# Patient Record
Sex: Female | Born: 1945 | Race: White | Hispanic: No | Smoking: Former smoker
Health system: Southern US, Community
[De-identification: ages and names within clinical notes are randomized; demographics above are authoritative.]

## PROBLEM LIST (undated history)

## (undated) DIAGNOSIS — T753XXA Motion sickness, initial encounter: Secondary | ICD-10-CM

## (undated) DIAGNOSIS — J029 Acute pharyngitis, unspecified: Secondary | ICD-10-CM

## (undated) DIAGNOSIS — I10 Essential (primary) hypertension: Secondary | ICD-10-CM

## (undated) DIAGNOSIS — M1990 Unspecified osteoarthritis, unspecified site: Secondary | ICD-10-CM

## (undated) DIAGNOSIS — M539 Dorsopathy, unspecified: Secondary | ICD-10-CM

## (undated) DIAGNOSIS — J45909 Unspecified asthma, uncomplicated: Secondary | ICD-10-CM

## (undated) DIAGNOSIS — M810 Age-related osteoporosis without current pathological fracture: Secondary | ICD-10-CM

## (undated) DIAGNOSIS — K219 Gastro-esophageal reflux disease without esophagitis: Secondary | ICD-10-CM

## (undated) DIAGNOSIS — Z22322 Carrier or suspected carrier of Methicillin resistant Staphylococcus aureus: Secondary | ICD-10-CM

## (undated) HISTORY — PX: TONSILLECTOMY: SUR1361

## (undated) HISTORY — DX: Carrier or suspected carrier of methicillin resistant Staphylococcus aureus: Z22.322

## (undated) HISTORY — DX: Acute pharyngitis, unspecified: J02.9

## (undated) HISTORY — PX: ABDOMINAL HYSTERECTOMY: SHX81

---

## 2004-10-25 ENCOUNTER — Ambulatory Visit: Payer: Self-pay | Admitting: General Surgery

## 2004-10-28 ENCOUNTER — Ambulatory Visit: Payer: Self-pay | Admitting: General Surgery

## 2004-11-17 ENCOUNTER — Ambulatory Visit: Payer: Self-pay | Admitting: General Surgery

## 2006-01-26 ENCOUNTER — Ambulatory Visit: Payer: Self-pay | Admitting: Family Medicine

## 2006-05-26 ENCOUNTER — Ambulatory Visit: Payer: Self-pay | Admitting: Family Medicine

## 2007-02-08 ENCOUNTER — Ambulatory Visit: Payer: Self-pay | Admitting: Family Medicine

## 2007-04-23 ENCOUNTER — Observation Stay: Payer: Self-pay | Admitting: Internal Medicine

## 2007-04-23 ENCOUNTER — Other Ambulatory Visit: Payer: Self-pay

## 2007-05-30 ENCOUNTER — Ambulatory Visit: Payer: Self-pay | Admitting: Family Medicine

## 2007-06-06 ENCOUNTER — Observation Stay: Payer: Self-pay | Admitting: Internal Medicine

## 2007-06-06 ENCOUNTER — Other Ambulatory Visit: Payer: Self-pay

## 2007-10-03 ENCOUNTER — Emergency Department: Payer: Self-pay | Admitting: Internal Medicine

## 2008-06-29 ENCOUNTER — Ambulatory Visit: Payer: Self-pay | Admitting: Family Medicine

## 2008-07-16 ENCOUNTER — Ambulatory Visit: Payer: Self-pay | Admitting: Family Medicine

## 2009-01-13 ENCOUNTER — Ambulatory Visit: Payer: Self-pay | Admitting: Family Medicine

## 2009-03-23 DIAGNOSIS — K219 Gastro-esophageal reflux disease without esophagitis: Secondary | ICD-10-CM | POA: Insufficient documentation

## 2009-08-10 ENCOUNTER — Encounter: Payer: Self-pay | Admitting: Rheumatology

## 2009-08-20 ENCOUNTER — Encounter: Payer: Self-pay | Admitting: Rheumatology

## 2009-09-01 ENCOUNTER — Ambulatory Visit: Payer: Self-pay | Admitting: Family Medicine

## 2010-02-03 ENCOUNTER — Ambulatory Visit: Payer: Self-pay | Admitting: Family Medicine

## 2010-03-15 DIAGNOSIS — E782 Mixed hyperlipidemia: Secondary | ICD-10-CM | POA: Insufficient documentation

## 2010-11-24 ENCOUNTER — Ambulatory Visit: Payer: Self-pay | Admitting: Rheumatology

## 2013-12-16 ENCOUNTER — Ambulatory Visit: Payer: Self-pay | Admitting: Family Medicine

## 2014-10-13 DIAGNOSIS — G894 Chronic pain syndrome: Secondary | ICD-10-CM | POA: Insufficient documentation

## 2014-11-14 ENCOUNTER — Encounter: Payer: Self-pay | Admitting: Emergency Medicine

## 2014-11-14 ENCOUNTER — Emergency Department
Admission: EM | Admit: 2014-11-14 | Discharge: 2014-11-14 | Disposition: A | Discharge status: Home / Self Care | Payer: Medicare HMO | Attending: Emergency Medicine | Admitting: Emergency Medicine

## 2014-11-14 DIAGNOSIS — M542 Cervicalgia: Secondary | ICD-10-CM | POA: Insufficient documentation

## 2014-11-14 DIAGNOSIS — M1990 Unspecified osteoarthritis, unspecified site: Secondary | ICD-10-CM | POA: Diagnosis not present

## 2014-11-14 DIAGNOSIS — Z880 Allergy status to penicillin: Secondary | ICD-10-CM | POA: Insufficient documentation

## 2014-11-14 DIAGNOSIS — Z79899 Other long term (current) drug therapy: Secondary | ICD-10-CM | POA: Insufficient documentation

## 2014-11-14 DIAGNOSIS — G8929 Other chronic pain: Secondary | ICD-10-CM | POA: Insufficient documentation

## 2014-11-14 DIAGNOSIS — M25552 Pain in left hip: Secondary | ICD-10-CM | POA: Insufficient documentation

## 2014-11-14 DIAGNOSIS — M549 Dorsalgia, unspecified: Secondary | ICD-10-CM | POA: Diagnosis present

## 2014-11-14 DIAGNOSIS — Z720 Tobacco use: Secondary | ICD-10-CM | POA: Insufficient documentation

## 2014-11-14 DIAGNOSIS — M25522 Pain in left elbow: Secondary | ICD-10-CM | POA: Diagnosis not present

## 2014-11-14 MED ORDER — OXYCODONE-ACETAMINOPHEN 10-325 MG PO TABS: 1.0000 | ORAL_TABLET | Freq: Every day | ORAL | Status: DC | PRN

## 2014-11-14 MED ORDER — OXYCODONE-ACETAMINOPHEN 5-325 MG PO TABS
ORAL_TABLET | ORAL | Status: DC
Start: 2014-11-14 — End: 2014-11-14
  Filled 2014-11-14: qty 1

## 2014-11-14 MED ORDER — OXYCODONE-ACETAMINOPHEN 5-325 MG PO TABS
1.0000 | ORAL_TABLET | Freq: Once | ORAL | Status: AC
  Administered 2014-11-14: 1 via ORAL

## 2014-11-14 NOTE — ED Notes (Signed)
Patient states that she has arthritis and that her pain has been worse today. Patient states that she takes percocet but has been out of her pain medication. Patient with pain to left elbow, lower back and left hip.

## 2014-11-14 NOTE — ED Notes (Signed)
Dr. Dolores Frame notified of pt's hypertension.

## 2014-11-14 NOTE — ED Notes (Signed)
Report to bill, rn.  

## 2014-11-14 NOTE — ED Provider Notes (Signed)
[facility] Emergency Department Provider Note  ____________________________________________  Time seen: 7:10 AM  I have reviewed the triage vital signs and the nursing notes.   HISTORY  Chief Complaint Back Pain; Hip Pain; and Arthritis    HPI Jennifer Rivers is a 69 y.o. female who complains of acute worsening of her chronic osteoarthritis joint pains due to running out of her Percocet. She normally gets it filled around the 25th of every month according to review of the drug database. She last had it filled in May 2016 and gets 30 day supply each time. She reports that due to a personal issue between her primary care doctor and her sister, she is having to find a new doctor and is therefore unable to get a refill of her medication. She does have refills remaining of her Valium.She denies any new or unusual symptoms. No chest pain shortness of breath abdominal pain no falls or trauma. No headaches or neck pain of note.     No past medical history on file. Asthma, osteoarthritis, chronic pain, hypertension There are no active problems to display for this patient.   No past surgical history on file.  Current Outpatient Rx  Name  Route  Sig  Dispense  Refill  . albuterol (PROVENTIL HFA;VENTOLIN HFA) 108 (90 BASE) MCG/ACT inhaler   Inhalation   Inhale 2 puffs into the lungs every 4 (four) hours as needed for wheezing or shortness of breath.         . cyclobenzaprine (FLEXERIL) 10 MG tablet   Oral   Take 10 mg by mouth 3 (three) times daily as needed for muscle spasms.         . diazepam (VALIUM) 5 MG tablet   Oral   Take 5 mg by mouth 2 (two) times daily as needed for muscle spasms.         . metoprolol tartrate (LOPRESSOR) 25 MG tablet   Oral   Take 25 mg by mouth 2 (two) times daily.         Marland Kitchen oxyCODONE-acetaminophen (PERCOCET) 10-325 MG per tablet   Oral   Take 1 tablet by mouth daily as needed for pain.         Marland Kitchen  oxyCODONE-acetaminophen (PERCOCET) 10-325 MG per tablet   Oral   Take 1 tablet by mouth daily as needed for pain.   10 tablet   0     Allergies Penicillins  No family history on file.  Social History History  Substance Use Topics  . Smoking status: Current Every Day Smoker -- 0.50 packs/day    Types: Cigarettes  . Smokeless tobacco: Not on file  . Alcohol Use: No    Review of Systems  Constitutional: No fever or chills. No weight changes Eyes:No blurry vision or double vision.  ENT: No sore throat. Cardiovascular: No chest pain. Respiratory: No dyspnea or cough. Gastrointestinal: Negative for abdominal pain, vomiting and diarrhea.  No BRBPR or melena. Genitourinary: Negative for dysuria, urinary retention, bloody urine, or difficulty urinating. Musculoskeletal: Chronic neck pain, chronic left elbow or back and left hip pain. Skin: Negative for rash. Neurological: Negative for headaches, focal weakness or numbness. Psychiatric:No anxiety or depression.   Endocrine:No hot/cold intolerance, changes in energy, or sleep difficulty.  10-point ROS otherwise negative.  ____________________________________________   PHYSICAL EXAM:  VITAL SIGNS: ED Triage Vitals  Enc Vitals Group     BP 11/14/14 0151 170/110 mmHg     Pulse Rate 11/14/14 0151 77  Resp 11/14/14 0151 18     Temp 11/14/14 0151 97.9 F (36.6 C)     Temp Source 11/14/14 0151 Oral     SpO2 11/14/14 0151 96 %     Weight 11/14/14 0151 130 lb (58.968 kg)     Height 11/14/14 0151 5\' 2"  (1.575 m)     Head Cir --      Peak Flow --      Pain Score 11/14/14 0152 7     Pain Loc --      Pain Edu? --      Excl. in GC? --      Constitutional: Alert and oriented. Well appearing and in no distress. Eyes: No scleral icterus. No conjunctival pallor. PERRL. EOMI ENT   Head: Normocephalic and atraumatic.   Nose: No congestion/rhinnorhea. No septal hematoma   Mouth/Throat: MMM, no pharyngeal erythema.  No peritonsillar mass. No uvula shift.   Neck: No stridor. No SubQ emphysema. No meningismus. C-spine nontender, full range of motion Hematological/Lymphatic/Immunilogical: No cervical lymphadenopathy. Cardiovascular: RRR. Normal and symmetric distal pulses are present in all extremities. No murmurs, rubs, or gallops. Respiratory: Normal respiratory effort without tachypnea nor retractions. Breath sounds are clear and equal bilaterally. No wheezes/rales/rhonchi. Gastrointestinal: Soft and nontender. No distention. There is no CVA tenderness.  No rebound, rigidity, or guarding. Genitourinary: deferred Musculoskeletal: Nontender with normal range of motion in all extremities. No joint effusions.  No lower extremity tenderness.  No edema. Ambulatory without difficulty Neurologic:   Normal speech and language.  CN 2-10 normal. Motor grossly intact. Normal gait. No gross focal neurologic deficits are appreciated.  Skin:  Skin is warm, dry and intact. No rash noted.  No petechiae, purpura, or bullae. Psychiatric: Mood and affect are normal. Speech and behavior are normal. Patient exhibits appropriate insight and judgment.  ____________________________________________    LABS (pertinent positives/negatives) (all labs ordered are listed, but only abnormal results are displayed) Labs Reviewed - No data to display ____________________________________________   EKG    ____________________________________________    RADIOLOGY    ____________________________________________   PROCEDURES  ____________________________________________   INITIAL IMPRESSION / ASSESSMENT AND PLAN / ED COURSE  Pertinent labs & imaging results that were available during my care of the patient were reviewed by me and considered in my medical decision making (see chart for details).  Patient presents with acute on chronic osteoarthritis pain due to running out of her Percocet. No other notable findings  on history or physical exam. I did inform the patient of our department policy against prescribing opioid pain medications for chronic pain and that I could only give her a short supply to help bridge the gap until she can find a new doctor. Patient was counseled on risks of opioid pain medication and avoiding driving or operating dangerous equipment while taking this medication. She is been on it chronically and so is very comfortable with how she tolerates the medicine. No evidence of fracture or dislocation sepsis and osteomyelitis soft tissue infection or other acute illness.  ____________________________________________   FINAL CLINICAL IMPRESSION(S) / ED DIAGNOSES  Final diagnoses:  Chronic osteoarthritis      Sharman Cheek, MD 11/14/14 [phone]

## 2014-11-14 NOTE — Discharge Instructions (Signed)
Arthritis, Nonspecific °Arthritis is inflammation of a joint. This usually means pain, redness, warmth or swelling are present. One or more joints may be involved. There are a number of types of arthritis. Your caregiver may not be able to tell what type of arthritis you have right away. °CAUSES  °The most common cause of arthritis is the wear and tear on the joint (osteoarthritis). This causes damage to the cartilage, which can break down over time. The knees, hips, back and neck are most often affected by this type of arthritis. °Other types of arthritis and common causes of joint pain include: °· Sprains and other injuries near the joint. Sometimes minor sprains and injuries cause pain and swelling that develop hours later. °· Rheumatoid arthritis. This affects hands, feet and knees. It usually affects both sides of your body at the same time. It is often associated with chronic ailments, fever, weight loss and general weakness. °· Crystal arthritis. Gout and pseudo gout can cause occasional acute severe pain, redness and swelling in the foot, ankle, or knee. °· Infectious arthritis. Bacteria can get into a joint through a break in overlying skin. This can cause infection of the joint. Bacteria and viruses can also spread through the blood and affect your joints. °· Drug, infectious and allergy reactions. Sometimes joints can become mildly painful and slightly swollen with these types of illnesses. °SYMPTOMS  °· Pain is the main symptom. °· Your joint or joints can also be red, swollen and warm or hot to the touch. °· You may have a fever with certain types of arthritis, or even feel overall ill. °· The joint with arthritis will hurt with movement. Stiffness is present with some types of arthritis. °DIAGNOSIS  °Your caregiver will suspect arthritis based on your description of your symptoms and on your exam. Testing may be needed to find the type of arthritis: °· Blood and sometimes urine tests. °· X-ray tests  and sometimes CT or MRI scans. °· Removal of fluid from the joint (arthrocentesis) is done to check for bacteria, crystals or other causes. Your caregiver (or a specialist) will numb the area over the joint with a local anesthetic, and use a needle to remove joint fluid for examination. This procedure is only minimally uncomfortable. °· Even with these tests, your caregiver may not be able to tell what kind of arthritis you have. Consultation with a specialist (rheumatologist) may be helpful. °TREATMENT  °Your caregiver will discuss with you treatment specific to your type of arthritis. If the specific type cannot be determined, then the following general recommendations may apply. °Treatment of severe joint pain includes: °· Rest. °· Elevation. °· Anti-inflammatory medication (for example, ibuprofen) may be prescribed. Avoiding activities that cause increased pain. °· Only take over-the-counter or prescription medicines for pain and discomfort as recommended by your caregiver. °· Cold packs over an inflamed joint may be used for 10 to 15 minutes every hour. Hot packs sometimes feel better, but do not use overnight. Do not use hot packs if you are diabetic without your caregiver's permission. °· A cortisone shot into arthritic joints may help reduce pain and swelling. °· Any acute arthritis that gets worse over the next 1 to 2 days needs to be looked at to be sure there is no joint infection. °Long-term arthritis treatment involves modifying activities and lifestyle to reduce joint stress jarring. This can include weight loss. Also, exercise is needed to nourish the joint cartilage and remove waste. This helps keep the muscles   around the joint strong. HOME CARE INSTRUCTIONS   Do not take aspirin to relieve pain if gout is suspected. This elevates uric acid levels.  Only take over-the-counter or prescription medicines for pain, discomfort or fever as directed by your caregiver.  Rest the joint as much as  possible.  If your joint is swollen, keep it elevated.  Use crutches if the painful joint is in your leg.  Drinking plenty of fluids may help for certain types of arthritis.  Follow your caregiver's dietary instructions.  Try low-impact exercise such as:  Swimming.  Water aerobics.  Biking.  Walking.  Morning stiffness is often relieved by a warm shower.  Put your joints through regular range-of-motion. SEEK MEDICAL CARE IF:   You do not feel better in 24 hours or are getting worse.  You have side effects to medications, or are not getting better with treatment. SEEK IMMEDIATE MEDICAL CARE IF:   You have a fever.  You develop severe joint pain, swelling or redness.  Many joints are involved and become painful and swollen.  There is severe back pain and/or leg weakness.  You have loss of bowel or bladder control. Document Released: 06/15/2004 Document Revised: 07/31/2011 Document Reviewed: 07/01/2008 [facility] Patient Information 2015 [place], [place]. This information is not intended to replace advice given to you by your health care provider. Make sure you discuss any questions you have with your health care provider.   You were prescribed a medication that is potentially sedating. Do not drink alcohol, drive or participate in any other potentially dangerous activities while taking this medication as it may make you sleepy. Do not take this medication with any other sedating medications, either prescription or over-the-counter. If you were prescribed Percocet or Vicodin, do not take these with acetaminophen (Tylenol) as it is already contained within these medications.   Opioid pain medications (or "narcotics") can be habit forming.  Use it as little as possible to achieve adequate pain control.  Do not use or use it with extreme caution if you have a history of opiate abuse or dependence.  If you are on a pain contract with your primary care doctor or a pain specialist,  be sure to let them know you were prescribed this medication today from the [facility] Emergency Department.  This medication is intended for your use only - do not give any to anyone else and keep it in a secure place where nobody else, especially children and pets, have access to it.  It will also cause or worsen constipation, so you may want to consider taking an over-the-counter stool softener while you are taking this medication.

## 2014-12-23 ENCOUNTER — Ambulatory Visit
Admission: EM | Admit: 2014-12-23 | Discharge: 2014-12-23 | Disposition: A | Discharge status: Home / Self Care | Payer: Medicare HMO | Attending: Family Medicine | Admitting: Family Medicine

## 2014-12-23 DIAGNOSIS — M25511 Pain in right shoulder: Secondary | ICD-10-CM | POA: Diagnosis not present

## 2014-12-23 HISTORY — DX: Age-related osteoporosis without current pathological fracture: M81.0

## 2014-12-23 HISTORY — DX: Essential (primary) hypertension: I10

## 2014-12-23 HISTORY — DX: Unspecified asthma, uncomplicated: J45.909

## 2014-12-23 HISTORY — DX: Unspecified osteoarthritis, unspecified site: M19.90

## 2014-12-23 HISTORY — DX: Dorsopathy, unspecified: M53.9

## 2014-12-23 MED ORDER — CYCLOBENZAPRINE HCL 10 MG PO TABS: 10.0000 mg | ORAL_TABLET | Freq: Three times a day (TID) | ORAL | Status: DC | PRN

## 2014-12-23 MED ORDER — CYCLOBENZAPRINE HCL 10 MG PO TABS: 10.0000 mg | ORAL_TABLET | Freq: Two times a day (BID) | ORAL | Status: DC | PRN

## 2014-12-23 MED ORDER — IBUPROFEN 600 MG PO TABS: 600.0000 mg | ORAL_TABLET | Freq: Four times a day (QID) | ORAL | Status: DC | PRN

## 2014-12-23 NOTE — ED Notes (Signed)
States started with sudden onset of right shoulder pain and right lateral neck discomfort. States has occasional tingling down right arm

## 2014-12-23 NOTE — Discharge Instructions (Signed)
Recommend supportive treatment with rest, warm moist compresses four times daily for Follow up as needed if symptoms worsen or don't improve Arthritis, Nonspecific Arthritis is inflammation of a joint. This usually means pain, redness, warmth or swelling are present. One or more joints may be involved. There are a number of types of arthritis. Your caregiver may not be able to tell what type of arthritis you have right away. CAUSES  The most common cause of arthritis is the wear and tear on the joint (osteoarthritis). This causes damage to the cartilage, which can break down over time. The knees, hips, back and neck are most often affected by this type of arthritis. Other types of arthritis and common causes of joint pain include:  Sprains and other injuries near the joint. Sometimes minor sprains and injuries cause pain and swelling that develop hours later.  Rheumatoid arthritis. This affects hands, feet and knees. It usually affects both sides of your body at the same time. It is often associated with chronic ailments, fever, weight loss and general weakness.  Crystal arthritis. Gout and pseudo gout can cause occasional acute severe pain, redness and swelling in the foot, ankle, or knee.  Infectious arthritis. Bacteria can get into a joint through a break in overlying skin. This can cause infection of the joint. Bacteria and viruses can also spread through the blood and affect your joints.  Drug, infectious and allergy reactions. Sometimes joints can become mildly painful and slightly swollen with these types of illnesses. SYMPTOMS   Pain is the main symptom.  Your joint or joints can also be red, swollen and warm or hot to the touch.  You may have a fever with certain types of arthritis, or even feel overall ill.  The joint with arthritis will hurt with movement. Stiffness is present with some types of arthritis. DIAGNOSIS  Your caregiver will suspect arthritis based on your  description of your symptoms and on your exam. Testing may be needed to find the type of arthritis:  Blood and sometimes urine tests.  X-ray tests and sometimes CT or MRI scans.  Removal of fluid from the joint (arthrocentesis) is done to check for bacteria, crystals or other causes. Your caregiver (or a specialist) will numb the area over the joint with a local anesthetic, and use a needle to remove joint fluid for examination. This procedure is only minimally uncomfortable.  Even with these tests, your caregiver may not be able to tell what kind of arthritis you have. Consultation with a specialist (rheumatologist) may be helpful. TREATMENT  Your caregiver will discuss with you treatment specific to your type of arthritis. If the specific type cannot be determined, then the following general recommendations may apply. Treatment of severe joint pain includes:  Rest.  Elevation.  Anti-inflammatory medication (for example, ibuprofen) may be prescribed. Avoiding activities that cause increased pain.  Only take over-the-counter or prescription medicines for pain and discomfort as recommended by your caregiver.  Cold packs over an inflamed joint may be used for 10 to 15 minutes every hour. Hot packs sometimes feel better, but do not use overnight. Do not use hot packs if you are diabetic without your caregiver's permission.  A cortisone shot into arthritic joints may help reduce pain and swelling.  Any acute arthritis that gets worse over the next 1 to 2 days needs to be looked at to be sure there is no joint infection. Long-term arthritis treatment involves modifying activities and lifestyle to reduce joint stress  jarring. This can include weight loss. Also, exercise is needed to nourish the joint cartilage and remove waste. This helps keep the muscles around the joint strong. HOME CARE INSTRUCTIONS   Do not take aspirin to relieve pain if gout is suspected. This elevates uric acid  levels.  Only take over-the-counter or prescription medicines for pain, discomfort or fever as directed by your caregiver.  Rest the joint as much as possible.  If your joint is swollen, keep it elevated.  Use crutches if the painful joint is in your leg.  Drinking plenty of fluids may help for certain types of arthritis.  Follow your caregiver's dietary instructions.  Try low-impact exercise such as:  Swimming.  Water aerobics.  Biking.  Walking.  Morning stiffness is often relieved by a warm shower.  Put your joints through regular range-of-motion. SEEK MEDICAL CARE IF:   You do not feel better in 24 hours or are getting worse.  You have side effects to medications, or are not getting better with treatment. SEEK IMMEDIATE MEDICAL CARE IF:   You have a fever.  You develop severe joint pain, swelling or redness.  Many joints are involved and become painful and swollen.  There is severe back pain and/or leg weakness.  You have loss of bowel or bladder control. Document Released: 06/15/2004 Document Revised: 07/31/2011 Document Reviewed: 07/01/2008 [facility] Patient Information 2015 [place], [place]. This information is not intended to replace advice given to you by your health care provider. Make sure you discuss any questions you have with your health care provider.

## 2014-12-23 NOTE — ED Provider Notes (Signed)
CSN: [id number]     Arrival date & time 12/23/14  1203 History   First MD Initiated Contact with Patient 12/23/14 1319     Chief Complaint  Patient presents with  . Shoulder Pain   (Consider location/radiation/quality/duration/timing/severity/associated sxs/prior Treatment) Patient is a 69 y.o. female presenting with shoulder pain.  Shoulder Pain   Pt reports that she was in her usual state of health until a week ago when she was cleaning the house.  Pain came on all of a sudden in right shoulder.  No trauma.  She has some popping in the shoulder joint when she moves but has had popping for years.  Pain 8/10 now.  Described as "a bad hurt".  Pain not worse with movement.  Nothing makes pain better.  She has tried goodys powders, bengay,& 2-3 ibuprofen every 5-6 hrs without relief.  She does have hx of osteoporosis & osteoarthritis.  She has chronic pain.  She has seen Dr Jennifer Nicks. Gavin Rivers but not in past 3 years.  Pain radiates down her arm & up right next.  Denies chest pain, diaphoresis or dizziness..  She reports bilateral limitation of shoulder ROM for months.  Never had orthopedic surgeries.  Past Medical History  Diagnosis Date  . Hypertension   . Asthma   . Multilevel degenerative disc disease   . Osteoarthritis   . Osteoporosis    Past Surgical History  Procedure Laterality Date  . Abdominal hysterectomy    . Tonsillectomy     Family History  Problem Relation Age of Onset  . Diabetes Mother   . Heart attack Father    History  Substance Use Topics  . Smoking status: Current Every Day Smoker -- 0.50 packs/day    Types: Cigarettes  . Smokeless tobacco: Not on file  . Alcohol Use: No   OB History    No data available     Review of Systems  All other systems reviewed and are negative.   Allergies  Penicillins  Home Medications   Prior to Admission medications   Medication Sig Start Date End Date Taking? Authorizing Provider  albuterol (PROVENTIL HFA;VENTOLIN HFA) 108  (90 BASE) MCG/ACT inhaler Inhale 2 puffs into the lungs every 4 (four) hours as needed for wheezing or shortness of breath.   Yes Historical Provider, MD  cyclobenzaprine (FLEXERIL) 10 MG tablet Take 10 mg by mouth 3 (three) times daily as needed for muscle spasms.   Yes Historical Provider, MD  metoprolol tartrate (LOPRESSOR) 25 MG tablet Take 25 mg by mouth 2 (two) times daily.   Yes Historical Provider, MD  diazepam (VALIUM) 5 MG tablet Take 5 mg by mouth 2 (two) times daily as needed for muscle spasms.    Historical Provider, MD  oxyCODONE-acetaminophen (PERCOCET) 10-325 MG per tablet Take 1 tablet by mouth daily as needed for pain.    Historical Provider, MD  oxyCODONE-acetaminophen (PERCOCET) 10-325 MG per tablet Take 1 tablet by mouth daily as needed for pain. 11/14/14 11/14/15  Sharman Cheek, MD   BP 158/105 mmHg  Pulse 86  Temp(Src) 98.2 F (36.8 C) (Tympanic)  Resp 16  Ht 5\' 2"  (1.575 m)  Wt 132 lb (59.875 kg)  BMI 24.14 kg/m2  SpO2 100% Physical Exam  Constitutional: She is oriented to person, place, and time. She appears well-developed and well-nourished. No distress.  HENT:  Head: Normocephalic and atraumatic.  Eyes: Conjunctivae are normal. No scleral icterus.  Neck: Normal range of motion.  Cardiovascular: Normal rate and regular  rhythm.   Pulmonary/Chest: Effort normal and breath sounds normal. No respiratory distress.  Musculoskeletal: She exhibits no edema.       Right shoulder: She exhibits tenderness and pain. She exhibits no swelling, no effusion, no crepitus, no deformity, no laceration, no spasm, normal pulse and normal strength.       Arms: Neurological: She is alert and oriented to person, place, and time. No cranial nerve deficit.  Skin: Skin is warm and dry. No rash noted. No erythema.  Psychiatric: Her behavior is normal. Judgment normal.  Nursing note and vitals reviewed.   ED Course  Procedures (including critical care time)   MDM   1. Right  shoulder pain    Likely secondary to chronic arthritis exacerbation.  If pain continues or worsens, FU with PCP or Dr Gavin Rivers, her Orthopedist.  Plan: 1. Rx as per orders; risks, benefits, potential side effects reviewed with patient 2. Recommend supportive treatment with rest, warm moist compresses four times daily for 3. F/u prn if symptoms worsen or don't improve with PCP or Ortho as above     Joselyn Arrow, NP 12/23/14 1413

## 2015-03-10 ENCOUNTER — Observation Stay
Admission: EM | Admit: 2015-03-10 | Discharge: 2015-03-11 | Disposition: A | Discharge status: Home / Self Care | Payer: Medicare HMO | Attending: Internal Medicine | Admitting: Internal Medicine

## 2015-03-10 ENCOUNTER — Encounter: Payer: Self-pay | Admitting: *Deleted

## 2015-03-10 ENCOUNTER — Emergency Department: Payer: Medicare HMO

## 2015-03-10 DIAGNOSIS — M1990 Unspecified osteoarthritis, unspecified site: Secondary | ICD-10-CM | POA: Diagnosis not present

## 2015-03-10 DIAGNOSIS — Z23 Encounter for immunization: Secondary | ICD-10-CM | POA: Diagnosis not present

## 2015-03-10 DIAGNOSIS — J45909 Unspecified asthma, uncomplicated: Secondary | ICD-10-CM | POA: Diagnosis not present

## 2015-03-10 DIAGNOSIS — M810 Age-related osteoporosis without current pathological fracture: Secondary | ICD-10-CM | POA: Diagnosis not present

## 2015-03-10 DIAGNOSIS — F329 Major depressive disorder, single episode, unspecified: Secondary | ICD-10-CM | POA: Insufficient documentation

## 2015-03-10 DIAGNOSIS — K219 Gastro-esophageal reflux disease without esophagitis: Secondary | ICD-10-CM | POA: Diagnosis not present

## 2015-03-10 DIAGNOSIS — F17210 Nicotine dependence, cigarettes, uncomplicated: Secondary | ICD-10-CM | POA: Insufficient documentation

## 2015-03-10 DIAGNOSIS — Z7951 Long term (current) use of inhaled steroids: Secondary | ICD-10-CM | POA: Diagnosis not present

## 2015-03-10 DIAGNOSIS — R079 Chest pain, unspecified: Principal | ICD-10-CM | POA: Insufficient documentation

## 2015-03-10 DIAGNOSIS — Z833 Family history of diabetes mellitus: Secondary | ICD-10-CM | POA: Insufficient documentation

## 2015-03-10 DIAGNOSIS — I491 Atrial premature depolarization: Secondary | ICD-10-CM | POA: Diagnosis not present

## 2015-03-10 DIAGNOSIS — Z880 Allergy status to penicillin: Secondary | ICD-10-CM | POA: Diagnosis not present

## 2015-03-10 DIAGNOSIS — Z79899 Other long term (current) drug therapy: Secondary | ICD-10-CM | POA: Insufficient documentation

## 2015-03-10 DIAGNOSIS — Z90710 Acquired absence of both cervix and uterus: Secondary | ICD-10-CM | POA: Insufficient documentation

## 2015-03-10 DIAGNOSIS — Z8249 Family history of ischemic heart disease and other diseases of the circulatory system: Secondary | ICD-10-CM | POA: Insufficient documentation

## 2015-03-10 DIAGNOSIS — K449 Diaphragmatic hernia without obstruction or gangrene: Secondary | ICD-10-CM | POA: Diagnosis not present

## 2015-03-10 DIAGNOSIS — I10 Essential (primary) hypertension: Secondary | ICD-10-CM | POA: Diagnosis not present

## 2015-03-10 DIAGNOSIS — J9811 Atelectasis: Secondary | ICD-10-CM | POA: Insufficient documentation

## 2015-03-10 LAB — BASIC METABOLIC PANEL
Anion gap: 8 (ref 5–15)
BUN: 30 mg/dL — AB (ref 6–20)
CALCIUM: 9.3 mg/dL (ref 8.9–10.3)
CO2: 25 mmol/L (ref 22–32)
CREATININE: 1.35 mg/dL — AB (ref 0.44–1.00)
Chloride: 102 mmol/L (ref 101–111)
GFR calc non Af Amer: 40 mL/min — ABNORMAL LOW (ref >60)
GFR, EST AFRICAN AMERICAN: 46 mL/min — AB (ref >60)
Glucose, Bld: 105 mg/dL — ABNORMAL HIGH (ref 65–99)
Potassium: 4.4 mmol/L (ref 3.5–5.1)
SODIUM: 135 mmol/L (ref 135–145)

## 2015-03-10 LAB — CBC
HCT: 40.1 % (ref 35.0–47.0)
Hemoglobin: 13.3 g/dL (ref 12.0–16.0)
MCH: 28.7 pg (ref 26.0–34.0)
MCHC: 33.2 g/dL (ref 32.0–36.0)
MCV: 86.4 fL (ref 80.0–100.0)
PLATELETS: 258 10*3/uL (ref 150–440)
RBC: 4.64 MIL/uL (ref 3.80–5.20)
RDW: 15.9 % — AB (ref 11.5–14.5)
WBC: 14.7 10*3/uL — AB (ref 3.6–11.0)

## 2015-03-10 NOTE — ED Notes (Signed)
Pt reports intermittent chest pain and pain between shoulders since last night. No other associated symptoms.

## 2015-03-11 ENCOUNTER — Observation Stay: Payer: Medicare HMO

## 2015-03-11 DIAGNOSIS — R079 Chest pain, unspecified: Secondary | ICD-10-CM | POA: Diagnosis present

## 2015-03-11 LAB — TROPONIN I
Troponin I: <0.03 ng/mL (ref <0.031)
Troponin I: <0.03 ng/mL (ref <0.031)
Troponin I: <0.03 ng/mL (ref <0.031)
Troponin I: <0.03 ng/mL (ref <0.031)

## 2015-03-11 LAB — HEPATIC FUNCTION PANEL
ALBUMIN: 4.2 g/dL (ref 3.5–5.0)
ALT: 20 U/L (ref 14–54)
AST: 22 U/L (ref 15–41)
Alkaline Phosphatase: 78 U/L (ref 38–126)
Total Protein: 7.8 g/dL (ref 6.5–8.1)

## 2015-03-11 LAB — NM MYOCAR MULTI W/SPECT W/WALL MOTION / EF
CHL CUP NUCLEAR SDS: 1
CHL CUP NUCLEAR SRS: 2
LV sys vol: 52 mL
LVDIAVOL: 90 mL
NUC STRESS TID: 1.11
Peak HR: 114 {beats}/min
Rest HR: 71 {beats}/min
SSS: 2

## 2015-03-11 LAB — LIPASE, BLOOD: LIPASE: 33 U/L (ref 22–51)

## 2015-03-11 LAB — HEMOGLOBIN A1C: Hgb A1c MFr Bld: 5.6 % (ref 4.0–6.0)

## 2015-03-11 MED ORDER — TECHNETIUM TC 99M SESTAMIBI - CARDIOLITE
10.0000 | Freq: Once | INTRAVENOUS | Status: AC | PRN
  Administered 2015-03-11: 13:00:00 14.11 via INTRAVENOUS

## 2015-03-11 MED ORDER — MORPHINE SULFATE (PF) 4 MG/ML IV SOLN
4.0000 mg | Freq: Once | INTRAVENOUS | Status: AC
  Administered 2015-03-11: 4 mg via INTRAVENOUS
  Filled 2015-03-11: qty 1

## 2015-03-11 MED ORDER — DOCUSATE SODIUM 100 MG PO CAPS
100.0000 mg | ORAL_CAPSULE | Freq: Two times a day (BID) | ORAL | Status: DC
  Administered 2015-03-11: 100 mg via ORAL
  Filled 2015-03-11: qty 1

## 2015-03-11 MED ORDER — INFLUENZA VAC SPLIT QUAD 0.5 ML IM SUSY: 0.5000 mL | PREFILLED_SYRINGE | INTRAMUSCULAR | Status: DC

## 2015-03-11 MED ORDER — ACETAMINOPHEN 650 MG RE SUPP: 650.0000 mg | Freq: Four times a day (QID) | RECTAL | Status: DC | PRN

## 2015-03-11 MED ORDER — SODIUM CHLORIDE 0.9 % IJ SOLN
3.0000 mL | Freq: Two times a day (BID) | INTRAMUSCULAR | Status: DC
  Administered 2015-03-11: 3 mL via INTRAVENOUS

## 2015-03-11 MED ORDER — REGADENOSON 0.4 MG/5ML IV SOLN
0.4000 mg | Freq: Once | INTRAVENOUS | Status: AC
  Administered 2015-03-11: 0.4 mg via INTRAVENOUS

## 2015-03-11 MED ORDER — ONDANSETRON HCL 4 MG/2ML IJ SOLN
4.0000 mg | Freq: Once | INTRAMUSCULAR | Status: AC
  Administered 2015-03-11: 4 mg via INTRAVENOUS
  Filled 2015-03-11: qty 2

## 2015-03-11 MED ORDER — CYCLOBENZAPRINE HCL 10 MG PO TABS: 10.0000 mg | ORAL_TABLET | Freq: Two times a day (BID) | ORAL | Status: DC | PRN

## 2015-03-11 MED ORDER — MORPHINE SULFATE (PF) 2 MG/ML IV SOLN
2.0000 mg | INTRAVENOUS | Status: DC | PRN
Start: 2015-03-11 — End: 2015-03-11

## 2015-03-11 MED ORDER — HEPARIN SODIUM (PORCINE) 5000 UNIT/ML IJ SOLN
5000.0000 [IU] | Freq: Three times a day (TID) | INTRAMUSCULAR | Status: DC
  Administered 2015-03-11: 5000 [IU] via SUBCUTANEOUS
  Filled 2015-03-11: qty 1

## 2015-03-11 MED ORDER — ALBUTEROL SULFATE (2.5 MG/3ML) 0.083% IN NEBU: 3.0000 mL | INHALATION_SOLUTION | RESPIRATORY_TRACT | Status: DC | PRN

## 2015-03-11 MED ORDER — NITROGLYCERIN 0.4 MG SL SUBL
0.4000 mg | SUBLINGUAL_TABLET | Freq: Once | SUBLINGUAL | Status: AC
  Administered 2015-03-11 (×2): 0.4 mg via SUBLINGUAL
  Filled 2015-03-11: qty 1

## 2015-03-11 MED ORDER — ONDANSETRON HCL 4 MG/2ML IJ SOLN: 4.0000 mg | Freq: Four times a day (QID) | INTRAMUSCULAR | Status: DC | PRN

## 2015-03-11 MED ORDER — OXYCODONE-ACETAMINOPHEN 10-325 MG PO TABS: 1.0000 | ORAL_TABLET | Freq: Every day | ORAL | Status: DC

## 2015-03-11 MED ORDER — ASPIRIN 81 MG PO CHEW
324.0000 mg | CHEWABLE_TABLET | Freq: Once | ORAL | Status: AC
  Administered 2015-03-11: 324 mg via ORAL
  Filled 2015-03-11: qty 4

## 2015-03-11 MED ORDER — SODIUM CHLORIDE 0.9 % IV SOLN
INTRAVENOUS | Status: DC
  Administered 2015-03-11: 05:00:00 via INTRAVENOUS

## 2015-03-11 MED ORDER — METOPROLOL SUCCINATE ER 50 MG PO TB24
50.0000 mg | ORAL_TABLET | Freq: Every day | ORAL | Status: DC
  Administered 2015-03-11: 50 mg via ORAL
  Filled 2015-03-11: qty 1

## 2015-03-11 MED ORDER — PANTOPRAZOLE SODIUM 40 MG PO TBEC
40.0000 mg | DELAYED_RELEASE_TABLET | Freq: Every day | ORAL | Status: DC
  Administered 2015-03-11: 40 mg via ORAL
  Filled 2015-03-11: qty 1

## 2015-03-11 MED ORDER — OXYCODONE-ACETAMINOPHEN 5-325 MG PO TABS
1.0000 | ORAL_TABLET | Freq: Every day | ORAL | Status: DC
  Administered 2015-03-11: 1 via ORAL
  Filled 2015-03-11: qty 1

## 2015-03-11 MED ORDER — INFLUENZA VAC SPLIT QUAD 0.5 ML IM SUSY
0.5000 mL | PREFILLED_SYRINGE | Freq: Once | INTRAMUSCULAR | Status: AC
  Administered 2015-03-11: 0.5 mL via INTRAMUSCULAR
  Filled 2015-03-11: qty 0.5

## 2015-03-11 MED ORDER — HYDROCHLOROTHIAZIDE 12.5 MG PO CAPS
12.5000 mg | ORAL_CAPSULE | Freq: Every day | ORAL | Status: DC
  Administered 2015-03-11: 12.5 mg via ORAL
  Filled 2015-03-11: qty 1

## 2015-03-11 MED ORDER — ACETAMINOPHEN 325 MG PO TABS
650.0000 mg | ORAL_TABLET | Freq: Four times a day (QID) | ORAL | Status: DC | PRN
  Administered 2015-03-11: 650 mg via ORAL
  Filled 2015-03-11: qty 2

## 2015-03-11 MED ORDER — OXYCODONE HCL 5 MG PO TABS
5.0000 mg | ORAL_TABLET | Freq: Every day | ORAL | Status: DC
  Administered 2015-03-11: 5 mg via ORAL
  Filled 2015-03-11: qty 1

## 2015-03-11 MED ORDER — TECHNETIUM TC 99M SESTAMIBI - CARDIOLITE
32.0500 | Freq: Once | INTRAVENOUS | Status: AC | PRN
  Administered 2015-03-11: 14:00:00 32.05 via INTRAVENOUS

## 2015-03-11 MED ORDER — ONDANSETRON HCL 4 MG PO TABS: 4.0000 mg | ORAL_TABLET | Freq: Four times a day (QID) | ORAL | Status: DC | PRN

## 2015-03-11 NOTE — H&P (Signed)
Jennifer Rivers is an 69 y.o. female.   Chief Complaint: Chest pain HPI: The patient presents emergency department via EMS after squeezing chest pain that was substernal and radiating to her back, specifically between her shoulder blades. The patient states that the pain began at rest and lasted until she received aspirin in the emergency department. She denies associated shortness of breath, nausea, vomiting or diaphoresis. She states she's been having similar pain but in lesser severity over the last 2 days. Due to cardiac risk factors emergency department called the hospitalist service for admission.  Past Medical History  Diagnosis Date  . Hypertension   . Asthma   . Multilevel degenerative disc disease   . Osteoarthritis   . Osteoporosis     Past Surgical History  Procedure Laterality Date  . Abdominal hysterectomy    . Tonsillectomy      Family History  Problem Relation Age of Onset  . Diabetes Mother   . Heart attack Father    Social History:  reports that she has been smoking Cigarettes.  She has been smoking about 0.50 packs per day. She does not have any smokeless tobacco history on file. She reports that she does not drink alcohol or use illicit drugs.  Allergies:  Allergies  Allergen Reactions  . Penicillins Rash    Medications Prior to Admission  Medication Sig Dispense Refill  . albuterol (PROVENTIL HFA;VENTOLIN HFA) 108 (90 BASE) MCG/ACT inhaler Inhale 2 puffs into the lungs every 4 (four) hours as needed for wheezing or shortness of breath.    Marland Kitchen albuterol (VENTOLIN HFA) 108 (90 BASE) MCG/ACT inhaler Take 2 puffs by mouth every 6 (six) hours as needed.    . cyclobenzaprine (FLEXERIL) 10 MG tablet Take 1 tablet (10 mg total) by mouth 2 (two) times daily as needed for muscle spasms. 20 tablet 0  . hydrochlorothiazide (MICROZIDE) 12.5 MG capsule Take 1 capsule by mouth daily.    Marland Kitchen HYDROcodone-acetaminophen (NORCO/VICODIN) 5-325 MG tablet Take 1 tablet by mouth every 6  (six) hours as needed.    Marland Kitchen ibuprofen (ADVIL,MOTRIN) 600 MG tablet Take 1 tablet (600 mg total) by mouth every 6 (six) hours as needed. 30 tablet 0  . meloxicam (MOBIC) 15 MG tablet Take 1 tablet by mouth daily.    . metoprolol succinate (TOPROL-XL) 50 MG 24 hr tablet Take 1 tablet by mouth daily.    Marland Kitchen omeprazole (PRILOSEC) 20 MG capsule Take 1 capsule by mouth daily.    Marland Kitchen oxyCODONE-acetaminophen (PERCOCET) 10-325 MG tablet Take 1 tablet by mouth daily.    Marland Kitchen zolpidem (AMBIEN CR) 12.5 MG CR tablet Take 1 tablet by mouth at bedtime as needed.      Results for orders placed or performed during the hospital encounter of 03/10/15 (from the past 48 hour(s))  Basic metabolic panel     Status: Abnormal   Collection Time: 03/10/15 10:01 PM  Result Value Ref Range   Sodium 135 135 - 145 mmol/L   Potassium 4.4 3.5 - 5.1 mmol/L   Chloride 102 101 - 111 mmol/L   CO2 25 22 - 32 mmol/L   Glucose, Bld 105 (H) 65 - 99 mg/dL   BUN 30 (H) 6 - 20 mg/dL   Creatinine, Ser 1.35 (H) 0.44 - 1.00 mg/dL   Calcium 9.3 8.9 - 10.3 mg/dL   GFR calc non Af Amer 40 (L) >60 mL/min   GFR calc Af Amer 46 (L) >60 mL/min    Comment: (NOTE) The eGFR has  been calculated using the CKD EPI equation. This calculation has not been validated in all clinical situations. eGFR's persistently <60 mL/min signify possible Chronic Kidney Disease.    Anion gap 8 5 - 15  CBC     Status: Abnormal   Collection Time: 03/10/15 10:01 PM  Result Value Ref Range   WBC 14.7 (H) 3.6 - 11.0 K/uL   RBC 4.64 3.80 - 5.20 MIL/uL   Hemoglobin 13.3 12.0 - 16.0 g/dL   HCT 40.1 35.0 - 47.0 %   MCV 86.4 80.0 - 100.0 fL   MCH 28.7 26.0 - 34.0 pg   MCHC 33.2 32.0 - 36.0 g/dL   RDW 15.9 (H) 11.5 - 14.5 %   Platelets 258 150 - 440 K/uL  Troponin I     Status: None   Collection Time: 03/10/15 10:01 PM  Result Value Ref Range   Troponin I <0.03 <0.031 ng/mL    Comment:        NO INDICATION OF MYOCARDIAL INJURY.   Lipase, blood     Status: None    Collection Time: 03/10/15 10:01 PM  Result Value Ref Range   Lipase 33 22 - 51 U/L  Hepatic function panel     Status: Abnormal   Collection Time: 03/10/15 10:01 PM  Result Value Ref Range   Total Protein 7.8 6.5 - 8.1 g/dL   Albumin 4.2 3.5 - 5.0 g/dL   AST 22 15 - 41 U/L   ALT 20 14 - 54 U/L   Alkaline Phosphatase 78 38 - 126 U/L   Total Bilirubin <0.1 (L) 0.3 - 1.2 mg/dL   Bilirubin, Direct <0.1 (L) 0.1 - 0.5 mg/dL   Indirect Bilirubin NOT CALCULATED 0.3 - 0.9 mg/dL   Dg Chest 2 View  03/10/2015  CLINICAL DATA:  Chest pain since yesterday, radiates to the back between both shoulders. EXAM: CHEST  2 VIEW COMPARISON:  01/13/2009 FINDINGS: The cardiomediastinal contours are normal. Linear atelectasis or scarring in both mid and lower lung zones. Pulmonary vasculature is normal. No consolidation, pleural effusion, or pneumothorax. No acute osseous abnormalities are seen. IMPRESSION: Unchanged appearance of the chest with bibasilar atelectasis or scarring. No acute process. Electronically Signed   By: Jeb Levering M.D.   On: 03/10/2015 22:19    Review of Systems  Constitutional: Negative for fever, chills and diaphoresis.  HENT: Negative for sore throat and tinnitus.   Eyes: Negative for blurred vision and redness.  Respiratory: Negative for cough and shortness of breath.   Cardiovascular: Positive for chest pain. Negative for palpitations, orthopnea and PND.  Gastrointestinal: Negative for nausea, vomiting, abdominal pain and diarrhea.  Genitourinary: Negative for dysuria, urgency and frequency.  Musculoskeletal: Negative for myalgias and joint pain.  Skin: Negative for rash.       No lesions  Neurological: Negative for speech change, focal weakness and weakness.  Endo/Heme/Allergies: Does not bruise/bleed easily.       No temperature intolerance  Psychiatric/Behavioral: Positive for depression. Negative for suicidal ideas.    Blood pressure 140/68, pulse 50, temperature 98  F (36.7 C), temperature source Oral, resp. rate 20, height 5' 2"  (1.575 m), weight 72.576 kg (160 lb), SpO2 98 %. Physical Exam  Vitals reviewed. Constitutional: She is oriented to person, place, and time. She appears well-developed and well-nourished. No distress.  HENT:  Head: Normocephalic and atraumatic.  Mouth/Throat: Oropharynx is clear and moist.  Eyes: EOM are normal. Pupils are equal, round, and reactive to light.  Neck: Normal  range of motion. Neck supple. No JVD present. No tracheal deviation present. No thyromegaly present.  Cardiovascular: Normal rate, regular rhythm and normal heart sounds.  Exam reveals no gallop and no friction rub.   No murmur heard. Respiratory: Effort normal and breath sounds normal.  GI: Soft. Bowel sounds are normal. She exhibits no distension. There is no tenderness.  Genitourinary:  Deferred  Musculoskeletal: Normal range of motion. She exhibits no edema.  Lymphadenopathy:    She has no cervical adenopathy.  Neurological: She is alert and oriented to person, place, and time. No cranial nerve deficit. She exhibits normal muscle tone.  Skin: Skin is warm and dry. No rash noted. No erythema.  Psychiatric: She has a normal mood and affect. Her behavior is normal. Judgment and thought content normal.     Assessment/Plan This is a 69 year old Caucasian female admitted for chest pain. 1. Chest pain: Atypical; the pain has been intermittent over the last 3 days. She admits that she has a hiatal hernia and reflux that requires her to sleep on 3 pillows although she states that if she does not use multiple pillows she occasionally becomes short of breath. I doubt this is orthopnea and the patient does not exhibit signs of congestive heart failure but we will rule out myocardial ischemia that may be leading to intermittent decreased cardiac output. Discontinue NSAIDs. Follow cardiac biomarkers. Check lipids. Cardiology consult placed. 2. Hypertension: Continue  hydrochlorothiazide 3. Depression: The patient admits to being under a lot of emotional stress; possibly amplifying noncardiac chest pain. Consider initiation of SSRI 4. DVT prophylaxis: Heparin 5. GI prophylaxis: Pantoprazole due to hiatal hernia and GERD The patient is a full code. Time spent on admission orders and patient care approximately 35 minutes  Harrie Foreman 03/11/2015, 4:49 AM

## 2015-03-11 NOTE — ED Notes (Signed)
Admitting MD at bedside for eval.

## 2015-03-11 NOTE — ED Notes (Signed)
Pt states chest pain is better after nitro SL administration. Pt rated pain 8/10 prior to administration and rates CP 5/10 after nitro SL.

## 2015-03-11 NOTE — Progress Notes (Signed)
Pt discharged to home via wc.  Instructions  given to pt.  Questions answered.  No distress.  

## 2015-03-11 NOTE — ED Notes (Signed)
Pt ambulatory to and from restroom without incident.

## 2015-03-11 NOTE — Discharge Instructions (Signed)

## 2015-03-11 NOTE — Progress Notes (Signed)
Dr. Renae GlossWieting made aware of pt's stress test results.

## 2015-03-11 NOTE — ED Provider Notes (Signed)
[facility] Emergency Department Provider Note  Time seen: 12:32 AM  I have reviewed the triage vital signs and the nursing notes.   HISTORY  Chief Complaint Chest Pain    HPI Jennifer Rivers is a 69 y.o. female with a past medical history of hypertension, arthritis, presents the emergency department with posterior chest/back pain. According to the patient for the past 4 days she has had intermittent pain which she describes as tightness in her chest radiating into her scapula.  States mild to moderate discomfort currently which she describes as a 5/10. Occasional shortness of breath denies nausea or diaphoresis. Patient states she has had 2 or 3 stress tests in the past which been negative. Denies any cardiac catheterizations in the past. Sees Dr. Juliann Pares.  Denies any known modifying factors.   Past Medical History  Diagnosis Date  . Hypertension   . Asthma   . Multilevel degenerative disc disease   . Osteoarthritis   . Osteoporosis     There are no active problems to display for this patient.   Past Surgical History  Procedure Laterality Date  . Abdominal hysterectomy    . Tonsillectomy      Current Outpatient Rx  Name  Route  Sig  Dispense  Refill  . albuterol (PROVENTIL HFA;VENTOLIN HFA) 108 (90 BASE) MCG/ACT inhaler   Inhalation   Inhale 2 puffs into the lungs every 4 (four) hours as needed for wheezing or shortness of breath.         Marland Kitchen albuterol (VENTOLIN HFA) 108 (90 BASE) MCG/ACT inhaler   Oral   Take 2 puffs by mouth every 6 (six) hours as needed.         . cyclobenzaprine (FLEXERIL) 10 MG tablet   Oral   Take 1 tablet (10 mg total) by mouth 2 (two) times daily as needed for muscle spasms.   20 tablet   0   . hydrochlorothiazide (MICROZIDE) 12.5 MG capsule   Oral   Take 1 capsule by mouth daily.         Marland Kitchen HYDROcodone-acetaminophen (NORCO/VICODIN) 5-325 MG tablet   Oral   Take 1 tablet by mouth every 6 (six) hours as  needed.         Marland Kitchen ibuprofen (ADVIL,MOTRIN) 600 MG tablet   Oral   Take 1 tablet (600 mg total) by mouth every 6 (six) hours as needed.   30 tablet   0   . meloxicam (MOBIC) 15 MG tablet   Oral   Take 1 tablet by mouth daily.         . metoprolol succinate (TOPROL-XL) 50 MG 24 hr tablet   Oral   Take 1 tablet by mouth daily.         Marland Kitchen omeprazole (PRILOSEC) 20 MG capsule   Oral   Take 1 capsule by mouth daily.         Marland Kitchen oxyCODONE-acetaminophen (PERCOCET) 10-325 MG tablet   Oral   Take 1 tablet by mouth daily.         Marland Kitchen zolpidem (AMBIEN CR) 12.5 MG CR tablet   Oral   Take 1 tablet by mouth at bedtime as needed.           Allergies Penicillins  Family History  Problem Relation Age of Onset  . Diabetes Mother   . Heart attack Father     Social History Social History  Substance Use Topics  . Smoking status: Current Every Day Smoker -- 0.50 packs/day  Types: Cigarettes  . Smokeless tobacco: None  . Alcohol Use: No    Review of Systems Constitutional: Negative for fever. Cardiovascular:  Positive for chest pain/tightness. Respiratory: Negative for shortness of breath. Gastrointestinal: Negative for abdominal pain Genitourinary: Negative for dysuria. Neurological: Negative for headache 10-point ROS otherwise negative.  ____________________________________________   PHYSICAL EXAM:  VITAL SIGNS: ED Triage Vitals  Enc Vitals Group     BP 03/10/15 2159 190/90 mmHg     Pulse Rate 03/10/15 2159 78     Resp 03/10/15 2159 16     Temp 03/10/15 2159 98.3 F (36.8 C)     Temp Source 03/10/15 2159 Oral     SpO2 03/10/15 2159 94 %     Weight 03/10/15 2159 160 lb (72.576 kg)     Height 03/10/15 2159 5\' 2"  (1.575 m)     Head Cir --      Peak Flow --      Pain Score 03/10/15 2158 8     Pain Loc --      Pain Edu? --      Excl. in GC? --    Constitutional: Alert and oriented. Well appearing and in no distress. Eyes: Normal exam ENT   Head:  Normocephalic and atraumatic.   Mouth/Throat: Mucous membranes are moist. Cardiovascular: Normal rate, regular rhythm. No murmur Respiratory: Normal respiratory effort without tachypnea nor retractions. Breath sounds are clear and equal bilaterally. No wheezes/rales/rhonchi. Gastrointestinal:  Soft, mild epigastric tenderness palpation. No rebound or guarding. No distention. Musculoskeletal: Nontender with normal range of motion in all extremities.  Mild pedal edema equal bilaterally, no calf tenderness to palpation. Neurologic:  Normal speech and language. No gross focal neurologic deficits  Skin:  Skin is warm, dry and intact.  Psychiatric: Mood and affect are normal. Speech and behavior are normal.  ____________________________________________    EKG  EKG reviewed and interpreted by myself shows normal sinus rhythm at 84 bpm, narrow QRS, normal axis, normal intervals, nonspecific ST changes are present. Largely unchanged from prior EKG of 06/06/2007.  ____________________________________________    RADIOLOGY  chest x-ray shows no acute abnormality  ____________________________________________   INITIAL IMPRESSION / ASSESSMENT AND PLAN / ED COURSE  Pertinent labs & imaging results that were available during my care of the patient were reviewed by me and considered in my medical decision making (see chart for details).  Patient presents with intermittent chest tightness radiating to her back. Currently describes her discomfort as a 5/10. We will dose aspirin, and try nitroglycerin for symptom relief. Currently awaiting lab results. Patient appears very well currently, no distress.   Labs show an elevated white blood cell count, otherwise within normal limits. Patient's chest pain decrease with nitroglycerin use. Have also dosed morphine for continued chest pain. Given the patient's concerning nonreproducible chest pain symptoms, we will admit to the hospital for further  treatment/evaluation. ____________________________________________   FINAL CLINICAL IMPRESSION(S) / ED DIAGNOSES  chest pain   Minna AntisKevin Tabetha Haraway, MD 03/11/15 0225

## 2015-03-11 NOTE — Discharge Summary (Signed)
[facility] [place] at [facility]   PATIENT NAME: Jennifer Rivers    MR#:  [id number]  DATE OF BIRTH:  [birth year]  DATE OF ADMISSION:  03/10/2015 ADMITTING PHYSICIAN: Arnaldo Natal, MD  DATE OF DISCHARGE: 03/11/2015  PRIMARY CARE PHYSICIAN: BLISS, Doreene Nest, MD    ADMISSION DIAGNOSIS:  Chest pain, unspecified chest pain type [R07.9]  DISCHARGE DIAGNOSIS:  Active Problems:   Chest pain   SECONDARY DIAGNOSIS:   Past Medical History  Diagnosis Date  . Hypertension   . Asthma   . Multilevel degenerative disc disease   . Osteoarthritis   . Osteoporosis     HOSPITAL COURSE:   1. Chest pain at night. Patient has history of hiatal hernia and acid reflux. Couldn't sleep a few nights pain radiating into the back. Cardiac enzymes 3 negative. Stress test pending but if negative will go home. Advised getting rid of ibuprofen. Continue omeprazole. 2. Essential hypertension continue usual medications 3. Asthma history- continue inhalers, lungs are clear upon discharge  4. Osteoarthritis- on meloxicam and pain medications   DISCHARGE CONDITIONS:   Satisfactory  CONSULTS OBTAINED:  None  DRUG ALLERGIES:   Allergies  Allergen Reactions  . Penicillins Rash    DISCHARGE MEDICATIONS:   Current Discharge Medication List    CONTINUE these medications which have NOT CHANGED   Details  !! albuterol (PROVENTIL HFA;VENTOLIN HFA) 108 (90 BASE) MCG/ACT inhaler Inhale 2 puffs into the lungs every 4 (four) hours as needed for wheezing or shortness of breath.    !! albuterol (VENTOLIN HFA) 108 (90 BASE) MCG/ACT inhaler Take 2 puffs by mouth every 6 (six) hours as needed.    cyclobenzaprine (FLEXERIL) 10 MG tablet Take 1 tablet (10 mg total) by mouth 2 (two) times daily as needed for muscle spasms. Qty: 20 tablet, Refills: 0    hydrochlorothiazide (MICROZIDE) 12.5 MG capsule Take 1 capsule by mouth daily.    HYDROcodone-acetaminophen (NORCO/VICODIN)  5-325 MG tablet Take 1 tablet by mouth every 6 (six) hours as needed.    meloxicam (MOBIC) 15 MG tablet Take 1 tablet by mouth daily.    metoprolol succinate (TOPROL-XL) 50 MG 24 hr tablet Take 1 tablet by mouth daily.    omeprazole (PRILOSEC) 20 MG capsule Take 1 capsule by mouth daily.    zolpidem (AMBIEN CR) 12.5 MG CR tablet Take 1 tablet by mouth at bedtime as needed.     !! - Potential duplicate medications found. Please discuss with provider.    STOP taking these medications     ibuprofen (ADVIL,MOTRIN) 600 MG tablet      oxyCODONE-acetaminophen (PERCOCET) 10-325 MG tablet          DISCHARGE INSTRUCTIONS:   Follow-up PMD one week  If you experience worsening of your admission symptoms, develop shortness of breath, life threatening emergency, suicidal or homicidal thoughts you must seek medical attention immediately by calling 911 or calling your MD immediately  if symptoms less severe.  You Must read complete instructions/literature along with all the possible adverse reactions/side effects for all the Medicines you take and that have been prescribed to you. Take any new Medicines after you have completely understood and accept all the possible adverse reactions/side effects.   Please note  You were cared for by a hospitalist during your hospital stay. If you have any questions about your discharge medications or the care you received while you were in the hospital after you are discharged, you can call the unit and  asked to speak with the hospitalist on call if the hospitalist that took care of you is not available. Once you are discharged, your primary care physician will handle any further medical issues. Please note that NO REFILLS for any discharge medications will be authorized once you are discharged, as it is imperative that you return to your primary care physician (or establish a relationship with a primary care physician if you do not have one) for your aftercare  needs so that they can reassess your need for medications and monitor your lab values.    Today   CHIEF COMPLAINT:   Chief Complaint  Patient presents with  . Chest Pain    HISTORY OF PRESENT ILLNESS:  Samuel GermanyDorothy Rivers  is a 69 y.o. female with a known history of hypertension presented with chest pain at night.   VITAL SIGNS:  Blood pressure 114/59, pulse 50, temperature 97.6 F (36.4 C), temperature source Oral, resp. rate 18, height 5\' 2"  (1.575 m), weight 76.386 kg (168 lb 6.4 oz), SpO2 96 %.    PHYSICAL EXAMINATION:  GENERAL:  69 y.o.-year-old patient lying in the bed with no acute distress.  EYES: Pupils equal, round, reactive to light and accommodation. No scleral icterus. Extraocular muscles intact.  HEENT: Head atraumatic, normocephalic. Oropharynx and nasopharynx clear.  NECK:  Supple, no jugular venous distention. No thyroid enlargement, no tenderness.  LUNGS: Normal breath sounds bilaterally, no wheezing, rales,rhonchi or crepitation. No use of accessory muscles of respiration.  CARDIOVASCULAR: S1, S2 normal. No murmurs, rubs, or gallops.  ABDOMEN: Soft, non-tender, non-distended. Bowel sounds present. No organomegaly or mass.  EXTREMITIES: No pedal edema, cyanosis, or clubbing.  NEUROLOGIC: Cranial nerves II through XII are intact. Muscle strength 5/5 in all extremities. Sensation intact. Gait not checked.  PSYCHIATRIC: The patient is alert and oriented x 3.  SKIN: No obvious rash, lesion, or ulcer.   DATA REVIEW:   CBC  Recent Labs Lab 03/10/15 2201  WBC 14.7*  HGB 13.3  HCT 40.1  PLT 258    Chemistries   Recent Labs Lab 03/10/15 2201  NA 135  K 4.4  CL 102  CO2 25  GLUCOSE 105*  BUN 30*  CREATININE 1.35*  CALCIUM 9.3  AST 22  ALT 20  ALKPHOS 78  BILITOT <0.1*    Cardiac Enzymes  Recent Labs Lab 03/11/15 1134  TROPONINI <0.03      RADIOLOGY:  Dg Chest 2 View  03/10/2015  CLINICAL DATA:  Chest pain since yesterday, radiates to  the back between both shoulders. EXAM: CHEST  2 VIEW COMPARISON:  01/13/2009 FINDINGS: The cardiomediastinal contours are normal. Linear atelectasis or scarring in both mid and lower lung zones. Pulmonary vasculature is normal. No consolidation, pleural effusion, or pneumothorax. No acute osseous abnormalities are seen. IMPRESSION: Unchanged appearance of the chest with bibasilar atelectasis or scarring. No acute process. Electronically Signed   By: Rubye OaksMelanie  Ehinger M.D.   On: 03/10/2015 22:19    Management plans discussed with the patient, family and they are in agreement.  CODE STATUS:     Code Status Orders        Start     Ordered   03/11/15 0421  Full code   Continuous     03/11/15 0420      TOTAL TIME TAKING CARE OF THIS PATIENT: 35  minutes.    Alford HighlandWIETING, Alijah Hyde M.D on 03/11/2015 at 2:49 PM  Between 7am to 6pm - Pager - [phone]  After 6pm go to  [email or web address] - password [email or web address]  [facility]  Office  [phone]  CC: Primary care physician; Dortha KernBLISS, LAURA K, MD

## 2015-03-11 NOTE — Progress Notes (Signed)
To nuclear medicine via bed 

## 2015-03-11 NOTE — ED Notes (Signed)
MD at bedside for eval.

## 2015-03-11 NOTE — Care Management (Signed)
Patient admitted under observation with chest pain.  Troponins are negative.  Cardiology consult pending

## 2015-03-11 NOTE — Progress Notes (Signed)
Pt admitted to 257, pt oriented to room and unit. Skin checked with Eden LatheLexi Miller RN. Pt has rash on chest and numerous bite marks on legs, she referred to as flea bites, see [facility] document for details

## 2015-07-14 ENCOUNTER — Other Ambulatory Visit: Payer: Self-pay | Admitting: Internal Medicine

## 2015-07-14 DIAGNOSIS — Z1231 Encounter for screening mammogram for malignant neoplasm of breast: Secondary | ICD-10-CM

## 2015-07-14 DIAGNOSIS — F329 Major depressive disorder, single episode, unspecified: Secondary | ICD-10-CM | POA: Insufficient documentation

## 2015-07-14 DIAGNOSIS — M818 Other osteoporosis without current pathological fracture: Secondary | ICD-10-CM | POA: Insufficient documentation

## 2015-07-14 DIAGNOSIS — G43009 Migraine without aura, not intractable, without status migrainosus: Secondary | ICD-10-CM | POA: Insufficient documentation

## 2015-07-14 DIAGNOSIS — I10 Essential (primary) hypertension: Secondary | ICD-10-CM | POA: Insufficient documentation

## 2015-07-14 DIAGNOSIS — J449 Chronic obstructive pulmonary disease, unspecified: Secondary | ICD-10-CM | POA: Insufficient documentation

## 2015-07-14 DIAGNOSIS — M159 Polyosteoarthritis, unspecified: Secondary | ICD-10-CM | POA: Insufficient documentation

## 2015-07-22 ENCOUNTER — Ambulatory Visit
Admission: RE | Admit: 2015-07-22 | Discharge: 2015-07-22 | Disposition: A | Discharge status: Home / Self Care | Payer: Medicare HMO | Source: Ambulatory Visit | Attending: Internal Medicine | Admitting: Internal Medicine

## 2015-07-22 DIAGNOSIS — Z1231 Encounter for screening mammogram for malignant neoplasm of breast: Secondary | ICD-10-CM | POA: Diagnosis present

## 2015-07-28 ENCOUNTER — Other Ambulatory Visit: Payer: Self-pay | Admitting: Orthopedic Surgery

## 2015-07-28 DIAGNOSIS — M2391 Unspecified internal derangement of right knee: Secondary | ICD-10-CM

## 2015-07-28 DIAGNOSIS — M1711 Unilateral primary osteoarthritis, right knee: Secondary | ICD-10-CM

## 2015-08-04 ENCOUNTER — Ambulatory Visit
Admission: EM | Admit: 2015-08-04 | Discharge: 2015-08-04 | Disposition: A | Discharge status: Home / Self Care | Payer: Medicare HMO | Attending: Family Medicine | Admitting: Family Medicine

## 2015-08-04 DIAGNOSIS — B9789 Other viral agents as the cause of diseases classified elsewhere: Secondary | ICD-10-CM

## 2015-08-04 DIAGNOSIS — J988 Other specified respiratory disorders: Principal | ICD-10-CM

## 2015-08-04 DIAGNOSIS — B349 Viral infection, unspecified: Secondary | ICD-10-CM

## 2015-08-04 LAB — RAPID INFLUENZA A&B ANTIGENS
Influenza A (ARMC): NEGATIVE
Influenza B (ARMC): NEGATIVE

## 2015-08-04 MED ORDER — BENZONATATE 100 MG PO CAPS: 100.0000 mg | ORAL_CAPSULE | Freq: Three times a day (TID) | ORAL | Status: DC | PRN

## 2015-08-04 MED ORDER — GUAIFENESIN-CODEINE 100-10 MG/5ML PO SOLN: 10.0000 mL | Freq: Every evening | ORAL | Status: DC | PRN

## 2015-08-04 NOTE — ED Notes (Signed)
Patient c/o cough, chills, wheezing, and headache which all started on Monday.  Denies fever/n/v or chest pain.

## 2015-08-04 NOTE — ED Provider Notes (Signed)
[facility]  ____________________________________________  Time seen: Approximately 2:21 PM  I have reviewed the triage vital signs and the nursing notes.   HISTORY  Chief Complaint Cough   HPI Jennifer Rivers is a 70 y.o. female presents for complaints of 1.5 days of runny nose, nasal congestion, and cough. Reports occasionally wheezing with cough.  Patient reports history of asthma and she does occasionally wheeze when she is sick at baseline. Patient reports that she mostly notices a wheeze when she coughs. Patient reports that she has a home albuterol inhaler that she has not had to use as the wheezing has not been "bad" or persistent. Reports had some chills, states temperature not higher than 99 degrees orally.   Reports her daughter has been at home with similar. Reports her cough is her main complaint. Patient presented the cough is a dry hacking cough. Patient reports that the cough did occasionally wake her up from sleep last night. Reports continues to eat and drink well.  Denies chest pain, shortness of breath, abdominal pain, dysuria, chest pain with deep breath, dizziness or weakness.   PCP: Dareen Piano    Past Medical History  Diagnosis Date  . Hypertension   . Asthma   . Multilevel degenerative disc disease   . Osteoarthritis   . Osteoporosis     Patient Active Problem List   Diagnosis Date Noted  . Chest pain 03/11/2015    Past Surgical History  Procedure Laterality Date  . Abdominal hysterectomy    . Tonsillectomy      Current Outpatient Rx  Name  Route  Sig  Dispense  Refill  . carvedilol (COREG) 6.25 MG tablet   Oral   Take 6.25 mg by mouth 2 (two) times daily with a meal.         . albuterol (PROVENTIL HFA;VENTOLIN HFA) 108 (90 BASE) MCG/ACT inhaler   Inhalation   Inhale 2 puffs into the lungs every 4 (four) hours as needed for wheezing or shortness of breath.         Marland Kitchen albuterol (VENTOLIN HFA) 108 (90 BASE) MCG/ACT inhaler   Oral   Take 2 puffs by mouth every 6 (six) hours as needed.         . cyclobenzaprine (FLEXERIL) 10 MG tablet   Oral   Take 1 tablet (10 mg total) by mouth 2 (two) times daily as needed for muscle spasms.   20 tablet   0   . hydrochlorothiazide (MICROZIDE) 12.5 MG capsule   Oral   Take 1 capsule by mouth daily.         Marland Kitchen HYDROcodone-acetaminophen (NORCO/VICODIN) 5-325 MG tablet   Oral   Take 1 tablet by mouth every 6 (six) hours as needed.         . meloxicam (MOBIC) 15 MG tablet   Oral   Take 1 tablet by mouth daily.         . metoprolol succinate (TOPROL-XL) 50 MG 24 hr tablet   Oral   Take 1 tablet by mouth daily.         Marland Kitchen EXPIRED: omeprazole (PRILOSEC) 20 MG capsule   Oral   Take 1 capsule by mouth daily.         Marland Kitchen zolpidem (AMBIEN CR) 12.5 MG CR tablet   Oral   Take 1 tablet by mouth at bedtime as needed.           Allergies Penicillins  Family History  Problem Relation Age of Onset  .  Diabetes Mother   . Heart attack Father     Social History Social History  Substance Use Topics  . Smoking status: Current Every Day Smoker -- 0.50 packs/day    Types: Cigarettes  . Smokeless tobacco: None  . Alcohol Use: No    Review of Systems Constitutional: as above.  Eyes: No visual changes. ENT: No sore throat. Positive runny nose nasal congestion and cough.  Cardiovascular: Denies chest pain. Respiratory: Denies shortness of breath. Gastrointestinal: No abdominal pain.  No nausea, no vomiting.  No diarrhea.  No constipation. Genitourinary: Negative for dysuria. Musculoskeletal: Negative for back pain. Skin: Negative for rash. Neurological: Negative for headaches, focal weakness or numbness.  10-point ROS otherwise negative.  ____________________________________________   PHYSICAL EXAM:  VITAL SIGNS: ED Triage Vitals  Enc Vitals Group     BP 08/04/15 1351 155/80 mmHg     Pulse Rate 08/04/15 1351 90     Resp 08/04/15 1351 18     Temp  08/04/15 1351 99.5 F (37.5 C)     Temp Source 08/04/15 1351 Oral     SpO2 08/04/15 1351 98 %     Weight 08/04/15 1351 150 lb (68.04 kg)     Height 08/04/15 1351  (1.575 m)     Head Cir --      Peak Flow --      Pain Score 08/04/15 1356 8     Pain Loc --      Pain Edu? --      Excl. in GC? --   Constitutional: Alert and oriented. Well appearing and in no acute distress. Eyes: Conjunctivae are normal. PERRL. EOMI. Head: Atraumatic. No sinus tenderness to palpation. No swelling. No erythema.  Ears: no erythema, normal TMs bilaterally.   Nose:Nasal congestion with clear rhinorrhea  Mouth/Throat: Mucous membranes are moist. No pharyngeal erythema. No tonsillar swelling or exudate.  Neck: No stridor.  No cervical spine tenderness to palpation. Hematological/Lymphatic/Immunilogical: No cervical lymphadenopathy. Cardiovascular: Normal rate, regular rhythm. Grossly normal heart sounds.  Good peripheral circulation. Respiratory: Normal respiratory effort.  No retractions. Lungs CTAB.No wheezes, rales or rhonchi. Good air movement. Occasional dry intermittent cough noted room. Speaks in complete sentences. Gastrointestinal: Soft and nontender. Normal Bowel sounds. No CVA tenderness. Musculoskeletal: No lower or upper extremity tenderness nor edema. No cervical, thoracic or lumbar tenderness to palpation. Neurologic:  Normal speech and language. No gross focal neurologic deficits are appreciated. No gait instability. Skin:  Skin is warm, dry and intact. No rash noted. Psychiatric: Mood and affect are normal. Speech and behavior are normal.  ____________________________________________   LABS (all labs ordered are listed, but only abnormal results are displayed)  Labs Reviewed  RAPID INFLUENZA A&B ANTIGENS ([facility] ONLY)    INITIAL IMPRESSION / ASSESSMENT AND PLAN / ED COURSE  Pertinent labs & imaging results that were available during my care of the patient were reviewed by me and  considered in my medical decision making (see chart for details).   Very well-appearing patient. No acute distress. Presents with complaints of 1.5 days of runny nose, nasal congestion, cough. Patient also reports occasional wheezing. No wheezing at this time. Lungs clear throughout. Abdomen soft and nontender. Very well-appearing patient. Suspect viral upper respiratory infection. Discussed with patient will treat supportively and symptomatically. Will treat patient with when necessary guaifenesin with codeine daily at bedtime. Patient reports tolerated this well on the past. PRN Tessalon Perles during the day. Encouraged rest, fluids and PCP follow-up. Discussed indication, risks and  benefits of medications with patient.  Discussed follow up with Primary care physician this week. Discussed follow up and return parameters including no resolution or any worsening concerns. Patient verbalized understanding and agreed to plan.   ____________________________________________   FINAL CLINICAL IMPRESSION(S) / ED DIAGNOSES  Final diagnoses:  Viral respiratory infection      Note: This dictation was prepared with Dragon dictation along with smaller phrase technology. Any transcriptional errors that result from this process are unintentional.    Renford DillsLindsey Vedansh Kerstetter, NP 08/04/15 1600

## 2015-08-04 NOTE — Discharge Instructions (Signed)
Take medication as prescribed. Rest. Drink plenty of fluids.   Follow up with your primary care physician this week . Return to Urgent care for new or worsening concerns.    Viral Infections A viral infection can be caused by different types of viruses.Most viral infections are not serious and resolve on their own. However, some infections may cause severe symptoms and may lead to further complications. SYMPTOMS Viruses can frequently cause:  Minor sore throat.  Aches and pains.  Headaches.  Runny nose.  Different types of rashes.  Watery eyes.  Tiredness.  Cough.  Loss of appetite.  Gastrointestinal infections, resulting in nausea, vomiting, and diarrhea. These symptoms do not respond to antibiotics because the infection is not caused by bacteria. However, you might catch a bacterial infection following the viral infection. This is sometimes called a "superinfection." Symptoms of such a bacterial infection may include:  Worsening sore throat with pus and difficulty swallowing.  Swollen neck glands.  Chills and a high or persistent fever.  Severe headache.  Tenderness over the sinuses.  Persistent overall ill feeling (malaise), muscle aches, and tiredness (fatigue).  Persistent cough.  Yellow, green, or brown mucus production with coughing. HOME CARE INSTRUCTIONS   Only take over-the-counter or prescription medicines for pain, discomfort, diarrhea, or fever as directed by your caregiver.  Drink enough water and fluids to keep your urine clear or pale yellow. Sports drinks can provide valuable electrolytes, sugars, and hydration.  Get plenty of rest and maintain proper nutrition. Soups and broths with crackers or rice are fine. SEEK IMMEDIATE MEDICAL CARE IF:   You have severe headaches, shortness of breath, chest pain, neck pain, or an unusual rash.  You have uncontrolled vomiting, diarrhea, or you are unable to keep down fluids.  You or your child has an  oral temperature above 102 F (38.9 C), not controlled by medicine.  Your baby is older than 3 months with a rectal temperature of 102 F (38.9 C) or higher.  Your baby is 293 months old or younger with a rectal temperature of 100.4 F (38 C) or higher. MAKE SURE YOU:   Understand these instructions.  Will watch your condition.  Will get help right away if you are not doing well or get worse.   This information is not intended to replace advice given to you by your health care provider. Make sure you discuss any questions you have with your health care provider.   Document Released: 02/15/2005 Document Revised: 07/31/2011 Document Reviewed: 10/14/2014 Elsevier Interactive Patient Education [employer]

## 2015-08-18 ENCOUNTER — Ambulatory Visit
Admission: RE | Admit: 2015-08-18 | Discharge: 2015-08-18 | Disposition: A | Discharge status: Home / Self Care | Payer: Medicare HMO | Source: Ambulatory Visit | Attending: Orthopedic Surgery | Admitting: Orthopedic Surgery

## 2015-08-18 DIAGNOSIS — S83271A Complex tear of lateral meniscus, current injury, right knee, initial encounter: Secondary | ICD-10-CM | POA: Insufficient documentation

## 2015-08-18 DIAGNOSIS — M659 Synovitis and tenosynovitis, unspecified: Secondary | ICD-10-CM | POA: Diagnosis not present

## 2015-08-18 DIAGNOSIS — M2391 Unspecified internal derangement of right knee: Secondary | ICD-10-CM

## 2015-08-18 DIAGNOSIS — M25461 Effusion, right knee: Secondary | ICD-10-CM | POA: Insufficient documentation

## 2015-08-18 DIAGNOSIS — S83231A Complex tear of medial meniscus, current injury, right knee, initial encounter: Secondary | ICD-10-CM | POA: Insufficient documentation

## 2015-08-18 DIAGNOSIS — M1711 Unilateral primary osteoarthritis, right knee: Secondary | ICD-10-CM | POA: Diagnosis present

## 2015-08-21 DIAGNOSIS — Z22322 Carrier or suspected carrier of Methicillin resistant Staphylococcus aureus: Secondary | ICD-10-CM

## 2015-08-21 HISTORY — DX: Carrier or suspected carrier of methicillin resistant Staphylococcus aureus: Z22.322

## 2015-08-23 DIAGNOSIS — S83209A Unspecified tear of unspecified meniscus, current injury, unspecified knee, initial encounter: Secondary | ICD-10-CM | POA: Insufficient documentation

## 2015-09-06 ENCOUNTER — Encounter
Admission: RE | Admit: 2015-09-06 | Discharge: 2015-09-06 | Disposition: A | Discharge status: Home / Self Care | Payer: Medicare HMO | Source: Ambulatory Visit | Attending: Unknown Physician Specialty | Admitting: Unknown Physician Specialty

## 2015-09-06 DIAGNOSIS — Z79899 Other long term (current) drug therapy: Secondary | ICD-10-CM | POA: Insufficient documentation

## 2015-09-06 DIAGNOSIS — K219 Gastro-esophageal reflux disease without esophagitis: Secondary | ICD-10-CM | POA: Diagnosis not present

## 2015-09-06 DIAGNOSIS — Z880 Allergy status to penicillin: Secondary | ICD-10-CM | POA: Diagnosis not present

## 2015-09-06 DIAGNOSIS — E119 Type 2 diabetes mellitus without complications: Secondary | ICD-10-CM | POA: Diagnosis not present

## 2015-09-06 DIAGNOSIS — J449 Chronic obstructive pulmonary disease, unspecified: Secondary | ICD-10-CM | POA: Insufficient documentation

## 2015-09-06 DIAGNOSIS — S83209A Unspecified tear of unspecified meniscus, current injury, unspecified knee, initial encounter: Secondary | ICD-10-CM | POA: Insufficient documentation

## 2015-09-06 DIAGNOSIS — Z888 Allergy status to other drugs, medicaments and biological substances status: Secondary | ICD-10-CM | POA: Insufficient documentation

## 2015-09-06 DIAGNOSIS — M1711 Unilateral primary osteoarthritis, right knee: Secondary | ICD-10-CM | POA: Diagnosis not present

## 2015-09-06 DIAGNOSIS — Z01812 Encounter for preprocedural laboratory examination: Secondary | ICD-10-CM | POA: Insufficient documentation

## 2015-09-06 DIAGNOSIS — M25561 Pain in right knee: Secondary | ICD-10-CM | POA: Insufficient documentation

## 2015-09-06 HISTORY — DX: Gastro-esophageal reflux disease without esophagitis: K21.9

## 2015-09-06 LAB — SURGICAL PCR SCREEN
MRSA, PCR: POSITIVE — AB
Staphylococcus aureus: POSITIVE — AB

## 2015-09-06 LAB — CBC
HCT: 29.5 % — ABNORMAL LOW (ref 35.0–47.0)
HEMOGLOBIN: 9.5 g/dL — AB (ref 12.0–16.0)
MCH: 23.6 pg — ABNORMAL LOW (ref 26.0–34.0)
MCHC: 32.2 g/dL (ref 32.0–36.0)
MCV: 73.3 fL — AB (ref 80.0–100.0)
PLATELETS: 444 10*3/uL — AB (ref 150–440)
RBC: 4.03 MIL/uL (ref 3.80–5.20)
RDW: 17.4 % — ABNORMAL HIGH (ref 11.5–14.5)
WBC: 9.5 10*3/uL (ref 3.6–11.0)

## 2015-09-06 LAB — BASIC METABOLIC PANEL
ANION GAP: 9 (ref 5–15)
BUN: 10 mg/dL (ref 6–20)
CO2: 26 mmol/L (ref 22–32)
Calcium: 9.8 mg/dL (ref 8.9–10.3)
Chloride: 97 mmol/L — ABNORMAL LOW (ref 101–111)
Creatinine, Ser: 1.12 mg/dL — ABNORMAL HIGH (ref 0.44–1.00)
GFR calc Af Amer: 58 mL/min — ABNORMAL LOW (ref >60)
GFR, EST NON AFRICAN AMERICAN: 50 mL/min — AB (ref >60)
GLUCOSE: 106 mg/dL — AB (ref 65–99)
POTASSIUM: 4 mmol/L (ref 3.5–5.1)
Sodium: 132 mmol/L — ABNORMAL LOW (ref 135–145)

## 2015-09-06 NOTE — Patient Instructions (Signed)
  Your procedure is scheduled on: Wednesday April 26 , 2017. Report to Same Day Surgery. To find out your arrival time please call [phone] between 1PM - 3PM on Tuesday September 14, 2015.  Remember: Instructions that are not followed completely may result in serious medical risk, up to and including death, or upon the discretion of your surgeon and anesthesiologist your surgery may need to be rescheduled.    _x___ 1. Do not eat food or drink liquids after midnight. No gum chewing or  hard candies.     ____ 2. No Alcohol for 24 hours before or after surgery.   ____ 3. Bring all medications with you on the day of surgery if instructed.    __x__ 4. Notify your doctor if there is any change in your medical condition     (cold, fever, infections).     Do not wear jewelry, make-up, hairpins, clips or nail polish.  Do not wear lotions, powders, or perfumes. You may wear deodorant.  Do not shave 48 hours prior to surgery. Men may shave face and neck.  Do not bring valuables to the hospital.    [facility] is not responsible for any belongings or valuables.               Contacts, dentures or bridgework may not be worn into surgery.  Leave your suitcase in the car. After surgery it may be brought to your room.  For patients admitted to the hospital, discharge time is determined by your treatment team.   Patients discharged the day of surgery will not be allowed to drive home.    Please read over the following fact sheets that you were given:   [facility] Pre[facility]  _x___ Take these medicines the morning of surgery with A SIP OF WATER:    1. carvedilol (COREG)  2. omeprazole (PRILOSEC)     ____ Fleet Enema (as directed)   _x___ Use CHG Soap as directed on instruction sheet  _x___ Use inhalers on the day of surgery and bring to hospital day of surgery  ____ Stop metformin 2 days prior to surgery    ____ Take 1/2 of usual insulin dose the night before surgery and none  on the morning of  surgery.   ____ Stop Coumadin/Plavix/aspirin on does not apply.  __x__ Stop Anti-inflammatories such as meloxicam (MOBIC), Advil, Aleve, Ibuprofen, Motrin, Naproxen, Naprosyn, Goodies powders or aspirin products. OK  to take Tylenol for pain.   ____ Stop supplements until after surgery.    ____ Bring C-Pap to the hospital.

## 2015-09-06 NOTE — Pre-Procedure Instructions (Signed)
Called and faxed a positive MRSA to Velna HatchetSheila, Dr [facility]illermina [facility] nurse.

## 2015-09-15 ENCOUNTER — Encounter: Payer: Self-pay | Admitting: *Deleted

## 2015-09-15 ENCOUNTER — Ambulatory Visit: Payer: Medicare HMO | Admitting: Anesthesiology

## 2015-09-15 ENCOUNTER — Encounter
Admission: RE | Disposition: A | Discharge status: Home / Self Care | Payer: Self-pay | Source: Ambulatory Visit | Attending: Unknown Physician Specialty

## 2015-09-15 ENCOUNTER — Ambulatory Visit
Admission: RE | Admit: 2015-09-15 | Discharge: 2015-09-15 | Disposition: A | Discharge status: Home / Self Care | Payer: Medicare HMO | Source: Ambulatory Visit | Attending: Unknown Physician Specialty | Admitting: Unknown Physician Specialty

## 2015-09-15 DIAGNOSIS — G43909 Migraine, unspecified, not intractable, without status migrainosus: Secondary | ICD-10-CM | POA: Diagnosis not present

## 2015-09-15 DIAGNOSIS — F329 Major depressive disorder, single episode, unspecified: Secondary | ICD-10-CM | POA: Insufficient documentation

## 2015-09-15 DIAGNOSIS — M659 Synovitis and tenosynovitis, unspecified: Secondary | ICD-10-CM | POA: Insufficient documentation

## 2015-09-15 DIAGNOSIS — N189 Chronic kidney disease, unspecified: Secondary | ICD-10-CM | POA: Diagnosis not present

## 2015-09-15 DIAGNOSIS — M5430 Sciatica, unspecified side: Secondary | ICD-10-CM | POA: Insufficient documentation

## 2015-09-15 DIAGNOSIS — I129 Hypertensive chronic kidney disease with stage 1 through stage 4 chronic kidney disease, or unspecified chronic kidney disease: Secondary | ICD-10-CM | POA: Diagnosis not present

## 2015-09-15 DIAGNOSIS — M23231 Derangement of other medial meniscus due to old tear or injury, right knee: Secondary | ICD-10-CM | POA: Insufficient documentation

## 2015-09-15 DIAGNOSIS — G4700 Insomnia, unspecified: Secondary | ICD-10-CM | POA: Insufficient documentation

## 2015-09-15 DIAGNOSIS — M1990 Unspecified osteoarthritis, unspecified site: Secondary | ICD-10-CM | POA: Insufficient documentation

## 2015-09-15 DIAGNOSIS — M23261 Derangement of other lateral meniscus due to old tear or injury, right knee: Secondary | ICD-10-CM | POA: Insufficient documentation

## 2015-09-15 DIAGNOSIS — E871 Hypo-osmolality and hyponatremia: Secondary | ICD-10-CM | POA: Insufficient documentation

## 2015-09-15 DIAGNOSIS — F17200 Nicotine dependence, unspecified, uncomplicated: Secondary | ICD-10-CM | POA: Insufficient documentation

## 2015-09-15 DIAGNOSIS — Z7951 Long term (current) use of inhaled steroids: Secondary | ICD-10-CM | POA: Insufficient documentation

## 2015-09-15 DIAGNOSIS — E1122 Type 2 diabetes mellitus with diabetic chronic kidney disease: Secondary | ICD-10-CM | POA: Insufficient documentation

## 2015-09-15 DIAGNOSIS — M810 Age-related osteoporosis without current pathological fracture: Secondary | ICD-10-CM | POA: Insufficient documentation

## 2015-09-15 DIAGNOSIS — E785 Hyperlipidemia, unspecified: Secondary | ICD-10-CM | POA: Insufficient documentation

## 2015-09-15 DIAGNOSIS — J449 Chronic obstructive pulmonary disease, unspecified: Secondary | ICD-10-CM | POA: Insufficient documentation

## 2015-09-15 DIAGNOSIS — Z79899 Other long term (current) drug therapy: Secondary | ICD-10-CM | POA: Insufficient documentation

## 2015-09-15 DIAGNOSIS — K219 Gastro-esophageal reflux disease without esophagitis: Secondary | ICD-10-CM | POA: Diagnosis not present

## 2015-09-15 HISTORY — PX: KNEE ARTHROSCOPY: SHX127

## 2015-09-15 SURGERY — ARTHROSCOPY, KNEE
Anesthesia: General | Site: Knee | Laterality: Right | Wound class: Clean

## 2015-09-15 MED ORDER — DEXAMETHASONE SODIUM PHOSPHATE 10 MG/ML IJ SOLN
INTRAMUSCULAR | Status: DC | PRN
  Administered 2015-09-15: 5 mg via INTRAVENOUS

## 2015-09-15 MED ORDER — FENTANYL CITRATE (PF) 100 MCG/2ML IJ SOLN
INTRAMUSCULAR | Status: DC | PRN
  Administered 2015-09-15 (×2): 25 ug via INTRAVENOUS
  Administered 2015-09-15: 50 ug via INTRAVENOUS
  Administered 2015-09-15: 25 ug via INTRAVENOUS

## 2015-09-15 MED ORDER — ONDANSETRON HCL 4 MG/2ML IJ SOLN: 4.0000 mg | Freq: Once | INTRAMUSCULAR | Status: DC | PRN

## 2015-09-15 MED ORDER — FENTANYL CITRATE (PF) 100 MCG/2ML IJ SOLN: 25.0000 ug | INTRAMUSCULAR | Status: DC | PRN

## 2015-09-15 MED ORDER — BUPIVACAINE HCL (PF) 0.5 % IJ SOLN
INTRAMUSCULAR | Status: AC
Start: 2015-09-15 — End: 2015-09-15
  Filled 2015-09-15: qty 30

## 2015-09-15 MED ORDER — BUPIVACAINE HCL (PF) 0.5 % IJ SOLN
INTRAMUSCULAR | Status: DC | PRN
  Administered 2015-09-15: 13 mL

## 2015-09-15 MED ORDER — LACTATED RINGERS IV SOLN
INTRAVENOUS | Status: DC
  Administered 2015-09-15: 09:00:00 via INTRAVENOUS

## 2015-09-15 MED ORDER — LIDOCAINE HCL (CARDIAC) 20 MG/ML IV SOLN
INTRAVENOUS | Status: DC | PRN
  Administered 2015-09-15: 100 mg via INTRAVENOUS

## 2015-09-15 MED ORDER — HYDROCODONE-ACETAMINOPHEN 5-325 MG PO TABS
ORAL_TABLET | ORAL | Status: AC
  Filled 2015-09-15: qty 1

## 2015-09-15 MED ORDER — ONDANSETRON HCL 4 MG/2ML IJ SOLN
INTRAMUSCULAR | Status: DC | PRN
  Administered 2015-09-15: 4 mg via INTRAVENOUS

## 2015-09-15 MED ORDER — PROPOFOL 10 MG/ML IV BOLUS
INTRAVENOUS | Status: DC | PRN
  Administered 2015-09-15: 150 mg via INTRAVENOUS
  Administered 2015-09-15: 30 mg via INTRAVENOUS

## 2015-09-15 MED ORDER — METOCLOPRAMIDE HCL 5 MG/ML IJ SOLN
INTRAMUSCULAR | Status: DC | PRN
Start: 2015-09-15 — End: 2015-09-15
  Administered 2015-09-15: 10 mg via INTRAVENOUS

## 2015-09-15 MED ORDER — HYDROCODONE-ACETAMINOPHEN 5-325 MG PO TABS
1.0000 | ORAL_TABLET | Freq: Once | ORAL | Status: AC
  Administered 2015-09-15: 1 via ORAL

## 2015-09-15 SURGICAL SUPPLY — 40 items
ARTHROWAND PARAGON T2 (SURGICAL WAND)
BLADE ABRADER 4.5 (BLADE) ×3 IMPLANT
BLADE FULL RADIUS 3.5 (BLADE) ×3 IMPLANT
BLADE SHAVER 4.5X7 STR FR (MISCELLANEOUS) ×3 IMPLANT
BNDG ESMARK 6X12 TAN STRL LF (GAUZE/BANDAGES/DRESSINGS) ×3 IMPLANT
BUR ABRADER 4.0 W/FLUTE AQUA (MISCELLANEOUS) IMPLANT
BURR ABRADER 4.0 W/FLUTE AQUA (MISCELLANEOUS)
CHLORAPREP W/TINT 26ML (MISCELLANEOUS) ×3 IMPLANT
CUFF TOURN 24 STER (MISCELLANEOUS) ×3 IMPLANT
DRAPE LEGGINS SURG 28X43 STRL (DRAPES) ×3 IMPLANT
DRESSING TELFA 4X3 1S ST N-ADH (GAUZE/BANDAGES/DRESSINGS) ×3 IMPLANT
GAUZE SPONGE 4X4 12PLY STRL (GAUZE/BANDAGES/DRESSINGS) ×3 IMPLANT
GLOVE BIO SURGEON STRL SZ7.5 (GLOVE) ×3 IMPLANT
GLOVE BIO SURGEON STRL SZ8 (GLOVE) ×3 IMPLANT
GLOVE INDICATOR 8.0 STRL GRN (GLOVE) ×3 IMPLANT
GOWN STRL REUS W/ TWL LRG LVL3 (GOWN DISPOSABLE) ×1 IMPLANT
GOWN STRL REUS W/TWL LRG LVL3 (GOWN DISPOSABLE) ×2
GOWN STRL REUS W/TWL LRG LVL4 (GOWN DISPOSABLE) ×3 IMPLANT
IV LACTATED RINGER IRRG 3000ML (IV SOLUTION) ×4
IV LR IRRIG 3000ML ARTHROMATIC (IV SOLUTION) ×2 IMPLANT
KIT RM TURNOVER STRD PROC AR (KITS) ×3 IMPLANT
MANIFOLD 4PT FOR NEPTUNE1 (MISCELLANEOUS) ×3 IMPLANT
PACK ARTHROSCOPY KNEE (MISCELLANEOUS) ×3 IMPLANT
PADDING CAST 4IN STRL (MISCELLANEOUS)
PADDING CAST BLEND 4X4 STRL (MISCELLANEOUS) IMPLANT
SET TUBE SUCT SHAVER OUTFL 24K (TUBING) ×3 IMPLANT
SET TUBE TIP INTRA-ARTICULAR (MISCELLANEOUS) ×3 IMPLANT
SOL PREP PVP 2OZ (MISCELLANEOUS) ×3
SOLUTION PREP PVP 2OZ (MISCELLANEOUS) ×1 IMPLANT
SUT ETH BLK MONO 3 0 FS 1 12/B (SUTURE) ×3 IMPLANT
SUT ETHILON 3-0 FS-10 30 BLK (SUTURE) ×3
SUTURE EHLN 3-0 FS-10 30 BLK (SUTURE) ×1 IMPLANT
TAPE MICROFOAM 4IN (TAPE) ×3 IMPLANT
TUBING ARTHRO INFLOW-ONLY STRL (TUBING) ×3 IMPLANT
WAND 30 DEG SABER W/CORD (SURGICAL WAND) IMPLANT
WAND ARTHRO PARAGON T2 (SURGICAL WAND) IMPLANT
WAND COVAC 50 IFS (MISCELLANEOUS) IMPLANT
WAND HAND CNTRL MULTIVAC 50 (MISCELLANEOUS) ×3 IMPLANT
WAND HAND CNTRL MULTIVAC 90 (MISCELLANEOUS) IMPLANT
WRAP KNEE W/COLD PACKS 25.5X14 (SOFTGOODS) ×3 IMPLANT

## 2015-09-15 NOTE — Anesthesia Procedure Notes (Signed)
Procedure Name: LMA Insertion Date/Time: 09/15/2015 9:34 AM Performed by: Peyton NajjarSIMMONS, Jennifer Romer Pre-anesthesia Checklist: Patient identified, Patient being monitored, Timeout performed, Emergency Drugs available and Suction available Patient Re-evaluated:Patient Re-evaluated prior to inductionOxygen Delivery Method: Circle system utilized Preoxygenation: Pre-oxygenation with 100% oxygen Intubation Type: IV induction Ventilation: Mask ventilation without difficulty LMA: LMA inserted LMA Size: 4.0 Tube type: Oral Number of attempts: 1 Placement Confirmation: positive ETCO2 and breath sounds checked- equal and bilateral Tube secured with: Tape Dental Injury: Teeth and Oropharynx as per pre-operative assessment

## 2015-09-15 NOTE — Discharge Instructions (Signed)
[facility]  Alda BertholdHarold B. [facility], Jr., M.D. [phone]   KNEE ARTHROSCOPY POST OPERATION INSTRUCTIONS:  PLEASE READ THESE INSTRUCTIONS ABOUT POST OPERATION CARE. THEY WILL ANSWER MOST OF YOUR QUESTIONS.  You have been given a prescription for pain. Please take as directed for pain.  You can walk, keeping the knee slightly stiff-avoid doing too much bending the first day. (if ACL reconstruction is performed, keep brace locked in extension when walking.)  You will use crutches or cane if needed. Can weight bear as tolerated  Plan to take three to four days off from work. You can resume work when you are comfortable. (This can be a week or more, depending on the type of work you do.)  To reduce pain and swelling, place one to two pillows under the knee the first two or three days when sitting or lying. An ice pack may be placed on top of the area over the dressing. Instructions for making homemade icepack are as follow:  Flexible homemade alcohol water ice pack  2 cups water  1 cup rubbing alcohol  food coloring for the blue tint (optional)  2 zip-top bags - gallon-size  Mix the water and alcohol together in one of your zip-top bags and add food coloring. Release as much air as possible and seal the bag. Place in freezer for at least 12 hours.  The small incisions in your knee are closed with nylon stitches. They will be removed in the office.  The bulky dressing may be removed in the third day after surgery. (If ACL surgery-DO NOT REMOVE BANDAGES). Put a waterproof band-aid over each stitch. Do not put any creams or ointments on wounds. You may shower at this time, but change waterproof band-aids after showering. KEEP INCISIONS CLEAN AND DRY UNTIL YOU RETURN TO THE OFFICE.  Sometimes the operative area remains somewhat painful and swollen for several weeks. This is usually nothing to worry about, but call if you have any excessive symptoms, especially  fever. It is not unusual to have a low grade fever of 99 degrees for the first few days. If persist after 3-4 days call the office. It is not uncommon for the pain to be a little worse on the third day after surgery.  Begin doing gentle exercises right away. They will be limited by the amount of pain and swelling you have.  Exercising will reduce the swelling, increase motion, and prevent muscle weakness. Exercises: Straight leg raising and gentle knee bending.  Take 81 milligram aspirin twice a day for 2 weeks after meals or milk. This along with elevation will help reduce the possibility of phlebitis in your operated leg.  Avoid strenuous athletics for a minimum of 4 to 6 weeks after arthroscopic surgery (approximately five months if ACL surgery).  If the surgery included ACL reconstruction the brace that is supplied to the extremity post surgery is to be locked in extension when you are asleep and is to be locked in extension when you are ambulating. It can be unlocked for exercises or sitting.  Keep your post surgery appointment that has been made for you. If you do not remember the date call [phone]. Your follow up appointment should be between 7-10 days.    AMBULATORY SURGERY  DISCHARGE INSTRUCTIONS  1) The drugs that you were given will stay in your system until tomorrow so for the next 24 hours you should not:  A) Drive an automobile B) Make any legal decisions  C) Drink any alcoholic beverage  2) You may resume regular meals tomorrow.  Today it is better to start with liquids and gradually work up to solid foods.  You may eat anything you prefer, but it is better to start with liquids, then soup and crackers, and gradually work up to solid foods.  3) Please notify your doctor immediately if you have any unusual bleeding, trouble breathing, redness and pain at the surgery site, drainage, fever, or pain not relieved by medication.  4) Additional Instructions:   Please contact your  physician with any problems or Same Day Surgery at [phone], Monday through Friday 6 am to 4 pm, or [place] at [facility] number at [phone].  Walker Use HOW TO TELL IF A WALKER IS THE RIGHT SIZE  With your arms hanging at your sides, the walker handles should be at wrist level. If you cannot find the exact fit, choose the height that is most comfortable.  If you have been instructed to not place weight on one of your legs, you may feel more comfortable with a shorter height. If you are using the walker for balance, you may prefer a taller height.  Adjust the height by using the push buttons on the legs of your walker.  In rest position, the back leg of the walkers should be no further ahead than your toes. With your hands resting on the grips, your elbows should be slightly bent at about a 30 degree angle.  Ask your physical therapist or caregiver if you have any concerns. HOW TO USE A STANDARD WALKER (NO WHEELS)  Pick your walker up (do not slide your walker) and place it one step length in front of you. The back legs of the walker should be no further ahead than your toes. You should not feel like you need to lean forward to keep your hands on the grips. As you set the walker down, make sure all 4 leg tips contact the ground at the same time.  Hold onto the walker for support and step forward with your weaker leg into the middle of the walker. Follow the weight bearing instructions your caregiver has given you.  Push down with your hands and step forward with your stronger leg.  Be careful not to let the walker get too far ahead of you as you walk.  Repeat the process for each step. HOW TO USE A FRONT-WHEELED WALKER  Slide your walker forward. The back legs of the walker should be no further ahead than your toes. You should not feel like you need to lean forward to keep your hands on the grips.  Hold onto the walker for support and step forward with your weaker leg into  the middle of the walker. Follow the weight bearing instructions your caregiver has given you.  Push down with your hands and step forward with your stronger leg.  Be careful not to let the walker get too far ahead of you as you walk.  Repeat the process for each step.  If your walker does not glide well over carpet, consider cutting an "x" in 2 old tennis balls and placing them over the back legs of your walker. STANDING UP FROM A CHAIR WITH ARMRESTS  It is best to sit in a firm chair with armrests.  Position your walker directly in front of your chair. Do not pull on the walker when standing up. It is too unstable to support weight when pulled on.  Slide forward in the chair, with your weaker leg ahead and stronger leg bent near the chair.  Lean forward and push up from your chair with both hands on the armrests. Straighten your stronger leg, rising to standing. Do not pull yourself up from the walker. This may cause it to tip.  When you feel steady on your feet, carefully move one hand at a time to the walker.  Stand for a few seconds to stabilize your balance before you start to walk. STANDING UP FROM A CHAIR WITHOUT ARMRESTS  It is best to sit in a firm chair. A low seat or an overstuffed chair or sofa is hard to get out of.  Place the walker in front of you. Do not pull on the walker when coming to a standing position.  Slide forward in the chair, with your weaker leg ahead and stronger leg bent near the chair.  Push down on the chair seat with the hand opposite your weaker leg. Keep your other hand on the center of the walker's crossbar.  Stand, steady your balance, and place your hands on the walker handgrips. SITTING DOWN  Always back up toward your chair, using your walker, until you feel the back of your legs touch the chair.  If the chair has armrests, carefully reach back to put your hands on the armrests, and slowly lower your weight.  If the chair does not have  armrests, consider backing up to the side of the chair. You can then hold onto the back of the chair and the front of the seat to slowly lower yourself.  You should never feel like you are falling into your chair. USING A WALKER ON STEPS   Before attempting to use your walker on steps, practice with your physical therapist.  If you are going up a step wide enough to accommodate the entire walker and yourself:  First, place the walker up on the step.  Second, get your feet as close to the step as you can.  Third, press down on the walker with your hands as you step up with your stronger leg. Then step up with your weaker leg.  If you are going down a step wide enough to accommodate the entire walker and yourself:  First, place the walker down on the step.  Second, hold onto the walker as you step down with your weaker leg. Then step down with your stronger leg.  If you are going up more than 1 step and have a railing:  First, turn the walker sideways, so the opening is facing in toward you.  Second, place the front 2 legs of the walker on the first step. These front legs should be positioned at the base of the next step.  Third, test the steadiness of the walker. It should feel sturdy when you press down on the handgrip that is facing the top of the steps.  Finally, placing your weight on the railing and the walker, step up with your stronger leg first. Then step up with your weaker leg.  If you are going down more than 1 step and have a railing:  First, turn the walker sideways, so the opening is facing in toward you.  Second, place the front 2 legs of the walker down on the first step. When possible, the back legs of the walker should be positioned at the base of the previous step.  Third, test the steadiness of the walker. It should feel sturdy when  you press down on the handgrip that is facing the top of the steps.  Finally, placing your weight on the railing and the walker,  step down with your weaker leg first. Then step down with your stronger leg.  Be sure to check the sturdiness of the walker before each step.  Make sure you have good rubber tips on the legs of your walker to prevent it from slipping.   This information is not intended to replace advice given to you by your health care provider. Make sure you discuss any questions you have with your health care provider.   Document Released: 05/08/2005 Document Revised: 07/31/2011 Document Reviewed: 11/20/2014 Elsevier Interactive Patient Education [employer]

## 2015-09-15 NOTE — Anesthesia Preprocedure Evaluation (Signed)
Anesthesia Evaluation  Patient identified by MRN, date of birth, ID band Patient awake    Reviewed: Allergy & Precautions, H&P , NPO status , Patient's Chart, lab work & pertinent test results, reviewed documented beta blocker date and time   History of Anesthesia Complications (+) PONV and history of anesthetic complications  Airway Mallampati: III  TM Distance: >3 FB Neck ROM: full    Dental no notable dental hx. (+) Teeth Intact, Caps   Pulmonary neg shortness of breath, asthma , neg sleep apnea, neg COPD, neg recent URI, former smoker,    Pulmonary exam normal breath sounds clear to auscultation       Cardiovascular Exercise Tolerance: Good hypertension, (-) angina(-) CAD, (-) Past MI, (-) Cardiac Stents and (-) CABG Normal cardiovascular exam(-) dysrhythmias (-) Valvular Problems/Murmurs Rhythm:regular Rate:Normal     Neuro/Psych negative neurological ROS  negative psych ROS   GI/Hepatic Neg liver ROS, GERD  ,  Endo/Other  negative endocrine ROS  Renal/GU negative Renal ROS  negative genitourinary   Musculoskeletal   Abdominal   Peds  Hematology negative hematology ROS (+)   Anesthesia Other Findings Past Medical History:   Hypertension                                                 Asthma                                                       Multilevel degenerative disc disease                         Osteoarthritis                                               Osteoporosis                                                 GERD (gastroesophageal reflux disease)                       Reproductive/Obstetrics negative OB ROS                             Anesthesia Physical Anesthesia Plan  ASA: II  Anesthesia Plan: General   Post-op Pain Management:    Induction:   Airway Management Planned:   Additional Equipment:   Intra-op Plan:   Post-operative Plan:   Informed  Consent: I have reviewed the patients History and Physical, chart, labs and discussed the procedure including the risks, benefits and alternatives for the proposed anesthesia with the patient or authorized representative who has indicated his/her understanding and acceptance.   Dental Advisory Given  Plan Discussed with: Anesthesiologist, CRNA and Surgeon  Anesthesia Plan Comments:         Anesthesia Quick Evaluation

## 2015-09-15 NOTE — H&P (Signed)
  H and P reviewed. No changes. Uploaded at later date. 

## 2015-09-15 NOTE — Op Note (Signed)
Patient: Jennifer Rivers, Jennifer Rivers  Preoperative diagnosis: Torn medial and lateral menisci plus multiple chondral lesions  Postop diagnosis: Same plus synovitis  Operation: Arthroscopic partial medial and lateral meniscectomies along with chondral debridement and synovial biopsy  Surgeon: Randon GoldsmithKERNODLE,Hanan Moen B, MD  Anesthesia: Gen.   History: Patient's had a long history of right knee pain.  The plain films revealed some medial and lateral compartment joint narrowing .  The patient had an MRI which revealed torn medial and lateral menisci, plus chondral pathology and synovitis.The patient was scheduled for surgery due to persistent discomfort despite conservative treatment.  The patient was taken the operating room where satisfactory general anesthesia was achieved. A tourniquet and leg holder were was applied to the right thigh. A well leg support was applied to the nonoperative extremity. The right  knee was prepped and draped in usual fashion for an arthroscopic procedure. An inflow cannula was introduced superomedially. The joint was distended with lactated Ringer's. Scope was introduced through an inferolateral puncture wound and a probe through an inferomedial puncture wound. Inspection of the medial compartment revealed  a torn middle third of the medial meniscus plus grade 3 femoral chondral changes. I resected the torn medial meniscus with a motorized resector and then contoured the remaining meniscal rim with an angled ArthroCare thermal wand. The medial femoral chondral lesion was debrided with a turbowhisker. Inspection of the intercondylar notch revealed intact cruciates and significant intercondylar notch synovitis.. Inspection of the lateral compartment revealed diffuse degenerative tearing of the lateral meniscus along with a grade 3 medial femoral chondral lesion and grade 3 lateral tibial plateau chondral lesion. I resected the lateral meniscus tear with a motorized resector and then contoured  the remaining rim with an angled ArthroCare thermal wand. I coblated the lateral femoral chondral lesion  Trochlear groove was inspected and appeared to be fairly smooth.  Retropatellar surface was reasonably smooth.The patella seemed to track fairly well. I did go ahead with a limited synovectomy plus synovial biopsy in the infrapatellar area and the intercondylar notch.  The instruments were removed from the joint at this time. The puncture wounds were closed with 3-0 nylon in vertical mattress fashion. I injected each puncture wound with several cc of half percent Marcaine without epinephrine. Betadine was applied the wounds followed by sterile dressing. An ice pack was applied to the right knee. The patient was awakened and transferred to the stretcher bed. The patient was taken to the recovery room in satisfactory condition.  The tourniquet was inflated at the start of the procedure. It was up about 48 minutes. It was released at the conclusion of the procedure. Blood loss was negligible.

## 2015-09-15 NOTE — Anesthesia Postprocedure Evaluation (Signed)
Anesthesia Post Note  Patient: Jennifer GermanyDorothy Rivers  Procedure(s) Performed: Procedure(s) (LRB): ARTHROSCOPY KNEE partial medial and lateral menisectomy, chondroplasty (Right)  Patient location during evaluation: PACU Anesthesia Type: General Level of consciousness: awake and alert Pain management: pain level controlled Vital Signs Assessment: post-procedure vital signs reviewed and stable Respiratory status: spontaneous breathing, nonlabored ventilation, respiratory function stable and patient connected to nasal cannula oxygen Cardiovascular status: blood pressure returned to baseline and stable Postop Assessment: no signs of nausea or vomiting Anesthetic complications: no    Last Vitals:  Filed Vitals:   09/15/15 1253 09/15/15 1300  BP: 126/58 125/66  Pulse: 88 86  Temp:    Resp: 14     Last Pain:  Filed Vitals:   09/15/15 1323  PainSc: 5                  Lenard SimmerAndrew Lakeshia Dohner

## 2015-09-15 NOTE — Transfer of Care (Signed)
Immediate Anesthesia Transfer of Care Note  Patient: Jennifer GermanyDorothy Rivers  Procedure(s) Performed: Procedure(s): ARTHROSCOPY KNEE partial medial and lateral menisectomy, chondroplasty (Right)  Patient Location: PACU  Anesthesia Type:General  Level of Consciousness: awake, alert , oriented and patient cooperative  Airway & Oxygen Therapy: Patient Spontanous Breathing and Patient connected to nasal cannula oxygen  Post-op Assessment: Report given to RN, Post -op Vital signs reviewed and stable and Patient moving all extremities  Post vital signs: Reviewed and stable  Last Vitals:  Filed Vitals:   09/15/15 0840 09/15/15 1113  BP: 168/87 144/65  Pulse: 89 88  Temp: 37.4 C 36.4 C  Resp: 16 17    Last Pain:  Filed Vitals:   09/15/15 1113  PainSc: 8          Complications: No apparent anesthesia complications

## 2015-09-17 LAB — SURGICAL PATHOLOGY

## 2015-12-28 ENCOUNTER — Encounter: Payer: Self-pay | Admitting: Pain Medicine

## 2015-12-28 ENCOUNTER — Ambulatory Visit
Admission: RE | Admit: 2015-12-28 | Discharge: 2015-12-28 | Disposition: A | Discharge status: Home / Self Care | Payer: Medicare HMO | Source: Ambulatory Visit | Attending: Pain Medicine | Admitting: Pain Medicine

## 2015-12-28 ENCOUNTER — Other Ambulatory Visit
Admission: RE | Admit: 2015-12-28 | Discharge: 2015-12-28 | Disposition: A | Discharge status: Home / Self Care | Payer: Medicare HMO | Source: Ambulatory Visit | Attending: Pain Medicine | Admitting: Pain Medicine

## 2015-12-28 ENCOUNTER — Ambulatory Visit (HOSPITAL_BASED_OUTPATIENT_CLINIC_OR_DEPARTMENT_OTHER): Payer: Medicare HMO | Admitting: Pain Medicine

## 2015-12-28 VITALS — BP 192/105 | HR 75 | Temp 98.1°F | Ht 62.0 in | Wt 160.0 lb

## 2015-12-28 DIAGNOSIS — M25562 Pain in left knee: Secondary | ICD-10-CM

## 2015-12-28 DIAGNOSIS — Z79899 Other long term (current) drug therapy: Secondary | ICD-10-CM | POA: Diagnosis not present

## 2015-12-28 DIAGNOSIS — M5136 Other intervertebral disc degeneration, lumbar region: Secondary | ICD-10-CM | POA: Insufficient documentation

## 2015-12-28 DIAGNOSIS — M79604 Pain in right leg: Secondary | ICD-10-CM

## 2015-12-28 DIAGNOSIS — M40294 Other kyphosis, thoracic region: Secondary | ICD-10-CM | POA: Diagnosis not present

## 2015-12-28 DIAGNOSIS — M50121 Cervical disc disorder at C4-C5 level with radiculopathy: Secondary | ICD-10-CM | POA: Insufficient documentation

## 2015-12-28 DIAGNOSIS — M179 Osteoarthritis of knee, unspecified: Secondary | ICD-10-CM

## 2015-12-28 DIAGNOSIS — M5412 Radiculopathy, cervical region: Secondary | ICD-10-CM | POA: Diagnosis present

## 2015-12-28 DIAGNOSIS — G8929 Other chronic pain: Secondary | ICD-10-CM

## 2015-12-28 DIAGNOSIS — M79605 Pain in left leg: Secondary | ICD-10-CM

## 2015-12-28 DIAGNOSIS — Z22322 Carrier or suspected carrier of Methicillin resistant Staphylococcus aureus: Secondary | ICD-10-CM | POA: Insufficient documentation

## 2015-12-28 DIAGNOSIS — F1190 Opioid use, unspecified, uncomplicated: Secondary | ICD-10-CM

## 2015-12-28 DIAGNOSIS — Z0189 Encounter for other specified special examinations: Secondary | ICD-10-CM | POA: Insufficient documentation

## 2015-12-28 DIAGNOSIS — M170 Bilateral primary osteoarthritis of knee: Secondary | ICD-10-CM

## 2015-12-28 DIAGNOSIS — M545 Low back pain: Secondary | ICD-10-CM

## 2015-12-28 DIAGNOSIS — M79606 Pain in leg, unspecified: Secondary | ICD-10-CM

## 2015-12-28 DIAGNOSIS — Z79891 Long term (current) use of opiate analgesic: Secondary | ICD-10-CM | POA: Diagnosis not present

## 2015-12-28 DIAGNOSIS — M549 Dorsalgia, unspecified: Secondary | ICD-10-CM

## 2015-12-28 DIAGNOSIS — M546 Pain in thoracic spine: Secondary | ICD-10-CM

## 2015-12-28 DIAGNOSIS — M25561 Pain in right knee: Secondary | ICD-10-CM

## 2015-12-28 DIAGNOSIS — M19011 Primary osteoarthritis, right shoulder: Secondary | ICD-10-CM | POA: Insufficient documentation

## 2015-12-28 DIAGNOSIS — M19012 Primary osteoarthritis, left shoulder: Secondary | ICD-10-CM

## 2015-12-28 DIAGNOSIS — M5410 Radiculopathy, site unspecified: Secondary | ICD-10-CM

## 2015-12-28 DIAGNOSIS — M25511 Pain in right shoulder: Secondary | ICD-10-CM

## 2015-12-28 DIAGNOSIS — M1710 Unilateral primary osteoarthritis, unspecified knee: Secondary | ICD-10-CM | POA: Insufficient documentation

## 2015-12-28 DIAGNOSIS — M25512 Pain in left shoulder: Secondary | ICD-10-CM

## 2015-12-28 DIAGNOSIS — Z5181 Encounter for therapeutic drug level monitoring: Secondary | ICD-10-CM | POA: Insufficient documentation

## 2015-12-28 LAB — SEDIMENTATION RATE: Sed Rate: 55 mm/hr — ABNORMAL HIGH (ref 0–30)

## 2015-12-28 LAB — MAGNESIUM: MAGNESIUM: 1.9 mg/dL (ref 1.7–2.4)

## 2015-12-28 LAB — COMPREHENSIVE METABOLIC PANEL
ALBUMIN: 4.2 g/dL (ref 3.5–5.0)
ALK PHOS: 115 U/L (ref 38–126)
ALT: 17 U/L (ref 14–54)
AST: 24 U/L (ref 15–41)
Anion gap: 9 (ref 5–15)
BILIRUBIN TOTAL: 0.2 mg/dL — AB (ref 0.3–1.2)
BUN: 18 mg/dL (ref 6–20)
CO2: 26 mmol/L (ref 22–32)
Calcium: 9.8 mg/dL (ref 8.9–10.3)
Chloride: 99 mmol/L — ABNORMAL LOW (ref 101–111)
Creatinine, Ser: 1.15 mg/dL — ABNORMAL HIGH (ref 0.44–1.00)
GFR calc Af Amer: 56 mL/min — ABNORMAL LOW (ref >60)
GFR, EST NON AFRICAN AMERICAN: 48 mL/min — AB (ref >60)
GLUCOSE: 114 mg/dL — AB (ref 65–99)
Potassium: 4.3 mmol/L (ref 3.5–5.1)
Sodium: 134 mmol/L — ABNORMAL LOW (ref 135–145)
TOTAL PROTEIN: 9.1 g/dL — AB (ref 6.5–8.1)

## 2015-12-28 LAB — VITAMIN B12: Vitamin B-12: 176 pg/mL — ABNORMAL LOW (ref 180–914)

## 2015-12-28 LAB — C-REACTIVE PROTEIN: CRP: 3 mg/dL — ABNORMAL HIGH (ref <1.0)

## 2015-12-28 NOTE — Progress Notes (Signed)
Safety precautions to be maintained throughout the outpatient stay will include: orient to surroundings, keep bed in low position, maintain call bell within reach at all times, provide assistance with transfer out of bed and ambulation.  

## 2015-12-28 NOTE — Progress Notes (Signed)
Patient's Name: Jennifer Rivers  Patient type: New patient  MRN: [id number]  Service setting: Ambulatory outpatient  DOB: 10/30/1945  Location: [facility]  DOS: 12/28/2015  Primary Care Physician: Kirk Ruths., MD  Note by: Kathlen Brunswick. Dossie Arbour, M.D, DABA, DABAPM, DABPM, DABIPP, FIPP  Referring Physician: Kirk Ruths, MD  Specialty: Board-Certified Interventional Pain Management     Primary Reason(s) for Visit: Initial Patient Evaluation CC: Knee Pain (bilateral, right knee worse ( had surgery 09-15-15)) and Rivers Pain (low)   HPI  Jennifer Rivers is a 70 y.o. year old, female patient, who comes today for an initial evaluation. She has Adult idiopathic generalized osteoporosis; Atypical migraine; COPD (chronic obstructive pulmonary disease) ([place]); Generalized OA; BP (high blood pressure); Current tear of meniscus; Major depression in remission ([place]); Chronic knee pain (Location of Primary Source of Pain) (Bilateral) (R>L); Long term current use of opiate analgesic; Long term prescription opiate use; Opiate use (20 MME/Day); Encounter for pain management planning; Encounter for therapeutic drug level monitoring; Chronic low Rivers pain (Location of Secondary source of pain) (Bilateral) (L>R); Chronic pain; Osteoarthritis of knee (Bilateral); Tricompartmental knee arthropathy (Bilateral); Chronic upper Rivers pain (between shoulder blades) (Bilateral) (R>L); Chronic shoulder pain (Location of Tertiary source of pain) (Bilateral) (L>R); 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 Her primarily concern today is the Knee Pain (bilateral, right knee worse ( had surgery 09-15-15)) and Rivers Pain (low)   Pain  Assessment: Self-Reported Pain Score: 7  Clinically the patient looks like a 2/10 Reported level is inconsistent with clinical obrservations Information on the proper use of the pain score provided to the patient today. Pain Type: Chronic pain Pain Location: Knee (Rivers) Pain Orientation: Right, Left, Lower Pain Descriptors / Indicators: Spasm, Sharp, Aching Pain Frequency: Constant (knee pain is constant and Rivers pain is intermittent)  Onset and Duration: Gradual and Present longer than 3 months Cause of pain: Arthritis Severity: No change since onset, NAS-11 at its worse: 10/10, NAS-11 at its best: 8/10, NAS-11 now: 10/10 and NAS-11 on the average: 9/10 Timing: Not influenced by the time of the day Aggravating Factors: Prolonged standing Alleviating Factors: Hot packs Associated Problems: Spasms, Swelling, Weakness and Pain that does not allow patient to sleep Quality of Pain: Annoying, Exhausting, Sharp, Throbbing and Toothache-like Previous Examinations or Tests: Bone scan, CT scan and X-rays Previous Treatments: Narcotic medications  The patient comes into the clinics today for the first time for a chronic pain management evaluation. The patient indicates that her primary pain is that of the knees with the right being worst on the left. This is followed by low Rivers pain with the left being worst on the right. The patient indicates that shoulder to be the third worst pain with the left being worst on the right. She also indicates having had left shoulder injections in the past but continuing to have pain in the area. Following the said areas, the next area of pain is that of the upper Rivers with the right being worst on the left. This pain is described to be between the shoulder blades, possibly following the distribution of the rhomboid muscles in which case he may be associated with a C5 radiculopathy.  The next area of pain is described to be the lower extremities with the right  being  worst on the left. In the case of the right lower extremity the pain goes to the top of the foot following an L5 dermatomal distribution. In the case of the left leg the pain goes to the bottom of the foot following an S1 dermatomal distribution.  Today I took the time to provide the patient with information regarding my pain practice. The patient was informed that my practice is divided into two sections: an interventional pain management section, as well as a completely separate and distinct medication management section. The interventional portion of my practice takes place on Tuesdays and Thursdays, while the medication management is conducted on Mondays and Wednesdays. Because of the amount of documentation required on both them, they are kept separated. This means that there is the possibility that the patient may be scheduled for a procedure on Tuesday, while also having a medication management appointment on Wednesday. I have also informed the patient that because of current staffing and facility limitations, I no longer take patients for medication management only. To illustrate the reasons for this, I gave the patient the example of a surgeon and how inappropriate it would be to refer a patient to his/her practice so that they write for the post-procedure antibiotics on a surgery done by someone else.   The patient was informed that joining my practice means that they are open to any and all interventional therapies. I clarified for the patient that this does not mean that they will be forced to have any procedures done. What it means is that patients looking for a practitioner to simply write for their pain medications and not take advantage of other interventional techniques will be better served by a different practitioner, other than myself. I made it clear that I prefer to spend my time providing those services that I specialize in.  The patient was also made aware of my Comprehensive Pain  Management Safety Guidelines where by joining my practice, they limit all of their nerve blocks and joint injections to those done by our practice, for as long as we are retained to manage their controlled substances.   Historic Controlled Substance Pharmacotherapy Review  Previously Prescribed Opioids: Hydrocodone/APAP 5/325 one tablet every 6 hours; oxycodone IR 10 mg 1 tablet every 8 hours. Currently Prescribed Analgesic: Hydrocodone/APAP 5/325 one tablet every 6 hours (20 mg/day of hydrocodone) Medications: The patient did not bring the medication(s) to the appointment, as requested in our "New Patient Package" MME/day: 20 mg/day Pharmacodynamics: Analgesic Effect: More than 50% Activity Facilitation: Medication(s) allow patient to sit, stand, walk, and do the basic ADLs Perceived Effectiveness: Described as relatively effective, allowing for increase in activities of daily living (ADL) Side-effects or Adverse reactions: None reported Historical Background Evaluation: [place] PDMP: Five (5) year initial data search conducted. No abnormal patterns identified [place] [employer] Offender Public Information: Non-contributory UDS Results: No UDS results available at this time UDS Interpretation: N/A Medication Assessment Form: Not applicable. Initial evaluation. The patient has not received any medications from our practice Treatment compliance: Not applicable. Initial evaluation Risk Assessment: Aberrant Behavior: None observed or detected today Opioid Fatal Overdose Risk Factors: None identified today Non-fatal overdose hazard ratio (HR): Calculation deferred Fatal overdose hazard ratio (HR): Calculation deferred Substance Use Disorder (SUD) Risk Level: Pending results of Medical Psychology Evaluation for SUD Opioid Risk Tool (ORT) Score: Total Score: 0 Low Risk for SUD (Score <3) Depression Scale Score: PHQ-2: PHQ-2 Total Score: 0 No depression (0) PHQ-9: PHQ-9  Total Score: 0 No  depression (0-4)  Pharmacologic Plan: Pending ordered tests and/or consults  Historical Illicit Drug Screen Labs(s): No results found for: MDMA, COCAINSCRNUR, PCPSCRNUR, THCU, ETH  Meds  The patient has a current medication list which includes the following prescription(s): albuterol, carvedilol, meloxicam, omeprazole, trazodone, and hydrocodone-acetaminophen.  Current Outpatient Prescriptions on File Prior to Visit  Medication Sig  . albuterol (VENTOLIN HFA) 108 (90 BASE) MCG/ACT inhaler Take 2 puffs by mouth every 6 (six) hours as needed.  . carvedilol (COREG) 6.25 MG tablet Take 6.25 mg by mouth 2 (two) times daily with a meal.  . traZODone (DESYREL) 50 MG tablet Take 50 mg by mouth at bedtime as needed for sleep.   No current facility-administered medications on file prior to visit.     Imaging Review  Cervical Imaging: Cervical DG complete:  Results for orders placed in visit on 02/03/10  DG Cervical Spine Complete   Narrative * PRIOR REPORT IMPORTED FROM AN EXTERNAL SYSTEM *   PRIOR REPORT IMPORTED FROM THE [facility] WORKFLOW SYSTEM   REASON FOR EXAM:    pain in neck  COMMENTS:   PROCEDURE:     MDR - MDR CERVICAL SPINE COMPLETE  - Feb 03 2010 10:52AM   RESULT:     Comparison: None.   Findings:  The cervical spine is imaged from the skull base through C7. There appears  to be partial osseous fusion of C2-C3. There is normal alignment. Mild  intervertebral disc height loss seen throughout the cervical spine.  Prevertebral soft tissues are within normal limits. Mild uncovertebral  hypertrophy seen in the mid and lower cervical spine.   IMPRESSION:  No evidence of cervical spine fracture or static listhesis. If there is  continued clinical concern for occult cervical spine fracture, further  evaluation with CT is recommended. Ligamentous injury not excluded.       Thoracic Imaging: Thoracic DG 2-3 views:  Results for orders placed in visit on 05/30/07  DG Thoracic  Spine 2 View   Narrative * PRIOR REPORT IMPORTED FROM AN EXTERNAL SYSTEM *   PRIOR REPORT IMPORTED FROM THE [facility] WORKFLOW SYSTEM   REASON FOR EXAM:    Osteopenia, kyphosis  COMMENTS:   PROCEDURE:     DXR - DXR THORACIC  AP AND LATERAL  - May 30 2007  3:59PM   RESULT:     The vertebral body heights are well maintained except for very  slight anterior wedging of T6. There is a moderate thoracic kyphosis but  no  fracture sufficient to explain the kyphosis is observed. No lytic or  blastic  lesions are identified. There is slight degenerative spurring anteriorly  at  multiple levels of the mid thoracic spine. The pedicles are bilaterally  intact.   IMPRESSION:      No acute changes are identified. A prominent kyphosis is  noted. There is noted a very slight, old appearing anterior wedge  deformity  at approximately T6.   Thank you for this opportunity to contribute to the care of your patient.       Lumbosacral Imaging: Lumbar MR wo contrast:  Results for orders placed in visit on 11/24/10  MR L Spine Ltd W/O Cm   Narrative * PRIOR REPORT IMPORTED FROM AN EXTERNAL SYSTEM *   PRIOR REPORT IMPORTED FROM THE [facility] WORKFLOW SYSTEM   REASON FOR EXAM:    Rivers and left leg pain numbness unresponsive to phy  therapy and meds  COMMENTS:  PROCEDURE:     MMR - MMR LUMBAR SPINE WO CONTRAST  - Nov 24 2010 11:13AM   RESULT:     Comparison: 05/26/2006   Technique: Standard lumbar spine protocol, without administration of IV  contrast.   Findings:  There is normal alignment. Bone marrow endplate reactive changes are seen  at  L4-L5, with intervertebral disc height loss at this level. Vertebral body  heights are maintained. The conus termination is normal.   T12-L1: No significant disc bulge or neuroforaminal narrowing.   L1-L2: No significant disc bulge or neuroforaminal narrowing.   L2-L3: No significant disc bulge or neuroforaminal narrowing.   L3-L4: Mild posterior disc  bulge with superimposed small left paracentral  disc protrusion cause mild mass effect on the ventral thecal sac. There is  mild canal stenosis. These findings are slightly increased from prior. No  neuroforaminal narrowing. There is mild degenerative facet disease.   L4-L5: Mild posterior disc bulge causes flattening of thecal sac. There is  mild degenerative facet disease. There is moderate right neuroforaminal  narrowing, which is increased from prior.   L5-S1: Mild posterior disc bulge causes minimal flattening of the ventral  thecal sac. There is mild degenerative facet disease. Mild right  neuroforaminal narrowing. This is similar to prior.   IMPRESSION:  Multilevel degenerative disc and facet disease, slightly increased at  L3-L4  where there is now mild canal stenosis. There is moderate right  neuroforaminal narrowing at L4-L5 which is increased from prior.       Lumbar DG 2-3 views:  Results for orders placed in visit on 10/03/07  DG Lumbar Spine 2-3 Views   Narrative * PRIOR REPORT IMPORTED FROM AN EXTERNAL SYSTEM *   PRIOR REPORT IMPORTED FROM THE [facility] WORKFLOW SYSTEM   REASON FOR EXAM:    Fall  COMMENTS:   PROCEDURE:     DXR - DXR LUMBAR SPINE AP AND LATERAL  - Oct 03 2007   3:58PM   RESULT:     The vertebral body heights are well maintained. No fracture is  seen. The vertebral body alignment is normal. There is narrowing of the  L3-4, L4-5 and L5-S1 intervertebral disc spaces consistent with disc  disease  at multiple levels. This could be further evaluated by MRI, if such is  clinically indicated. The pedicles are bilaterally intact.   IMPRESSION:   1.  No fracture is seen.  2.  Vertebral body alignment is normal.  3.  There is slight degenerative spurring anteriorly at multiple levels.  4.  There are changes consistent with disc disease at multiple levels of  the  lower lumbar spine. This can be further evaluated by MRI, if clinically  indicated.    Thank you for this opportunity to contribute to the care of your patient.       Knee Imaging: Knee-R MR wo contrast:  Results for orders placed during the hospital encounter of 08/18/15  MR Knee Right Wo Contrast   Narrative CLINICAL DATA:  Knee pain for 6 months. Limited range of motion. No prior surgery.  EXAM: MRI OF THE RIGHT KNEE WITHOUT CONTRAST  TECHNIQUE: Multiplanar, multisequence MR imaging of the knee was performed. No intravenous contrast was administered.  COMPARISON:  None.  FINDINGS: MENISCI  Medial meniscus: Fairly extensive complex flap type tear involving the posterior horn and entire midbody region.  Lateral meniscus: Superior and inferior articular surface tears involving the posterior horn.  LIGAMENTS  Cruciates:  Intact.  Collaterals:  Intact.  CARTILAGE  Patellofemoral: Advanced degenerative chondrosis with areas of full-thickness cartilage loss along the patellar apex and underlying subchondral cystic change (grade 4 chondromalacia).  Medial: Moderate to advanced degenerative chondrosis with associated joint space narrowing and osteophytic spurring.  Lateral: Moderate degenerative chondrosis with early joint space narrowing and osteophytic spurring.  Joint: Large joint effusion and moderate synovitis. Patellar plica are also noted.  Popliteal Fossa:  No popliteal mass or Baker's cyst.  Extensor Mechanism: The patella retinacular structures are intact and the quadriceps and patellar tendons are intact. Mild lateral orientation of the patella in relation to the femoral trochlear groove.  Bones: No acute bony findings. Tricompartmental degenerative changes.  IMPRESSION: 1. Extensive complex flap type tear involving the posterior horn and mid body region of the medial meniscus. 2. Superior and inferior articular surface tears involving the posterior horn of the lateral meniscus. 3. Intact ligamentous structures and no acute bony  findings. 4. Tricompartmental degenerative changes as discussed above. 5. Large joint effusion and moderate synovitis.   Electronically Signed   By: Marijo Sanes M.D.   On: 08/18/2015 13:36    Note: Imaging results reviewed.  ROS  Cardiovascular History: Abnormal heart rhythm, Daily Aspirin intake and Hypertension Pulmonary or Respiratory History: Asthma Neurological History: Negative for epilepsy, stroke, urinary or fecal inontinence, spina bifida or tethered cord syndrome Review of Past Neurological Studies: No results found for this or any previous visit. Psychological-Psychiatric History: Panic Attacks Gastrointestinal History: Hiatal hernia and Reflux or heatburn Genitourinary History: Negative for nephrolithiasis, hematuria, renal failure or chronic kidney disease Hematological History: Brusing easily and Bleeding easily Endocrine History: Negative for diabetes or thyroid disease Rheumatologic History: Osteoarthritis and Rheumatoid arthritis Musculoskeletal History: Negative for myasthenia gravis, muscular dystrophy, multiple sclerosis or malignant hyperthermia Work History: Retired Marine scientist.  Allergies  Ms. Gabor is allergic to bupropion; codeine; gabapentin; penicillins; and sulfa antibiotics.  Laboratory Chemistry  Inflammation Markers Lab Results  Component Value Date   ESRSEDRATE 55 (H) 12/28/2015   CRP 3.0 (H) 12/28/2015    Renal Function Lab Results  Component Value Date   BUN 18 12/28/2015   CREATININE 1.15 (H) 12/28/2015   GFRAA 56 (L) 12/28/2015   GFRNONAA 48 (L) 12/28/2015    Hepatic Function Lab Results  Component Value Date   AST 24 12/28/2015   ALT 17 12/28/2015   ALBUMIN 4.2 12/28/2015    Electrolytes Lab Results  Component Value Date   NA 134 (L) 12/28/2015   K 4.3 12/28/2015   CL 99 (L) 12/28/2015   CALCIUM 9.8 12/28/2015   MG 1.9 12/28/2015    Pain Modulating Vitamins Lab Results  Component Value Date   VITAMINB12 176 (L)  12/28/2015    Coagulation Parameters Lab Results  Component Value Date   PLT 444 (H) 09/06/2015    Cardiovascular Lab Results  Component Value Date   HGB 9.5 (L) 09/06/2015   HCT 29.5 (L) 09/06/2015    Note: Lab results reviewed.  [place]  Medical:  Ms. Sieg  has a past medical history of Asthma; GERD (gastroesophageal reflux disease); Hypertension; MRSA (methicillin resistant staph aureus) culture positive (08/2015); Multilevel degenerative disc disease; Osteoarthritis; and Osteoporosis. Family: family history includes Diabetes in her mother; Heart attack in her father. Surgical:  has a past surgical history that includes Abdominal hysterectomy; Tonsillectomy; and Knee arthroscopy (Right, 09/15/2015). Tobacco:  reports that she quit smoking about 4 months ago. Her smoking use included Cigarettes. She smoked 0.50 packs per day. She has never used smokeless tobacco.  Alcohol:  reports that she does not drink alcohol. Drug:  reports that she does not use drugs. Active Ambulatory Problems    Diagnosis Date Noted  . 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 Atypical migraine 07/14/2015  . COPD (chronic obstructive pulmonary disease) ([place]) 07/14/2015  . Generalized OA 07/14/2015  . BP (high blood pressure) 07/14/2015  . Current tear of meniscus 08/23/2015  . Major depression in remission ([place]) 07/14/2015  . Chronic knee pain (Location of Primary Source of Pain) (Bilateral) (R>L) 12/28/2015  . Long term current use of opiate analgesic 12/28/2015  . Long term prescription opiate use 12/28/2015  . Opiate use (20 MME/Day) 12/28/2015  . Encounter for pain management planning 12/28/2015  . Encounter for therapeutic drug level monitoring 12/28/2015  . Chronic low Rivers pain (Location of Secondary source of pain) (Bilateral) (L>R) 12/28/2015  . Chronic pain 12/28/2015  . Osteoarthritis of knee (Bilateral) 12/28/2015  . Tricompartmental knee arthropathy (Bilateral) 12/28/2015   . Chronic upper Rivers pain (between shoulder blades) (Bilateral) (R>L) 12/28/2015  . Chronic shoulder pain (Location of Tertiary source of pain) (Bilateral) (L>R) 12/28/2015  . 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 Chronic lower extremity pain (Bilateral) (R>L) 12/28/2015  . Chronic lower extremity radicular pain (Bilateral) (R>L) 12/28/2015  . MRSA (methicillin resistant staph aureus) culture positive 12/28/2015  . Radicular pain of shoulder 12/28/2015  . Chronic cervical radicular pain (C4/C5 dermatomes) 12/28/2015   Resolved Ambulatory Problems    Diagnosis Date Noted  . Chest pain 03/11/2015   Past Medical History:  Diagnosis Date  . Asthma   . GERD (gastroesophageal reflux disease)   . Hypertension   . MRSA (methicillin resistant staph aureus) culture positive 08/2015  . Multilevel degenerative disc disease   . Osteoarthritis   . Osteoporosis     Constitutional Exam  Vitals: Blood pressure (!) 192/105, pulse 75, temperature 98.1 F (36.7 C), height _0  (1.575 m), weight 160 lb (72.6 kg), SpO2 95 %. General appearance: Well nourished, well developed, and well hydrated. In no acute distress Calculated BMI/Body habitus: Body mass index is 29.26 kg/m. (25-29.9 kg/m2) Overweight - 20% higher incidence of chronic pain Psych/Mental status: Alert and oriented x 3 (person, place, & time) Eyes: PERLA Respiratory: No evidence of acute respiratory distress  Cervical Spine Exam  Inspection: No masses, redness, or swelling Alignment: Symmetrical Functional ROM: ROM appears unrestricted Stability: No instability detected Muscle strength & Tone: Functionally intact Sensory: Unimpaired Palpation: Non-contributory  Upper Extremity (UE) Exam    Side: Right upper extremity  Side: Left upper extremity  Inspection: No masses, redness, swelling, or asymmetry  Inspection: No masses, redness, swelling, or asymmetry  Functional ROM: Decreased ROM of the shoulder    Functional ROM: Decreased ROM of the shoulder   Muscle strength & Tone: Functionally intact  Muscle strength & Tone: Functionally intact  Sensory: Movement-associated pain  Sensory: Movement-associated pain  Palpation: Tender  Palpation: Complains of area being tender to palpation   Thoracic Spine Exam  Inspection: No masses, redness, or swelling Alignment: Symmetrical Functional ROM: ROM appears unrestricted Stability: No instability detected Sensory: Unimpaired Muscle strength & Tone: Functionally intact Palpation: Non-contributory  Lumbar Spine Exam  Inspection: No masses, redness, or swelling Alignment: Symmetrical Functional ROM: Decreased ROM Stability: No instability detected Muscle strength & Tone: Functionally intact Sensory: Movement-associated pain Palpation: Complains of area being tender to palpation Provocative Tests: Lumbar Hyperextension and rotation test: Positive bilaterally for facet joint pain. Patrick's Maneuver: evaluation deferred today  Gait & Posture Assessment  Ambulation: Unassisted Gait: Antalgic Posture: Poor   Lower Extremity Exam    Side: Right lower extremity  Side: Left lower extremity  Inspection: No masses, redness, swelling, or asymmetry  Inspection: No masses, redness, swelling, or asymmetry  Functional ROM: ROM appears unrestricted  Functional ROM: ROM appears unrestricted  Muscle strength & Tone: Able to Toe-walk & Heel-walk without problems  Muscle strength & Tone: Able to Toe-walk & Heel-walk without problems  Sensory: Unimpaired  Sensory: Unimpaired  Palpation: Non-contributory  Palpation: Non-contributory    Assessment  Primary Diagnosis & Pertinent Problem List: The primary encounter diagnosis was Chronic pain. Diagnoses of Long term current use of opiate analgesic, Long term prescription opiate use, Opiate use, Encounter for pain management planning, Encounter for therapeutic drug level monitoring, Chronic low Rivers  pain (Bilateral), Primary osteoarthritis of both knees, Tricompartmental knee arthropathy (Bilateral), Chronic knee pain (Bilateral), Chronic upper Rivers pain (between shoulder blades) (Bilateral) (R>L), Chronic shoulder pain (Location of Tertiary source of pain) (Bilateral) (L>R), Primary osteoarthritis of both shoulders, Chronic pain of lower extremity, unspecified laterality, Chronic lower extremity radicular pain (Bilateral) (R>L), MRSA (methicillin resistant staph aureus) culture positive, Radicular pain of shoulder, and Chronic cervical radicular pain (C4/C5 dermatomes) were also pertinent to this visit.  Visit Diagnosis: 1. Chronic pain   2. Long term current use of opiate analgesic   3. Long term prescription opiate use   4. Opiate use   5. Encounter for pain management planning   6. Encounter for therapeutic drug level monitoring   7. Chronic low Rivers pain (Bilateral)   8. Primary osteoarthritis of both knees   9. Tricompartmental knee arthropathy (Bilateral)   10. Chronic knee pain (Bilateral)   11. Chronic upper [place] pain (between shoulder blades) (Bilateral) (R>L)   12. Chronic shoulder pain (Location of Tertiary source of pain) (Bilateral) (L>R)   13. Primary osteoarthritis of both shoulders   14. Chronic pain of lower extremity, unspecified laterality   15. Chronic lower extremity radicular pain (Bilateral) (R>L)   16. MRSA (methicillin resistant staph aureus) culture positive   17. Radicular pain of shoulder   18. Chronic cervical radicular pain (C4/C5 dermatomes)     Assessment: No problem-specific Assessment & Plan notes found for this encounter.   Plan of Care  Initial Treatment Plan:  Please be advised that as per protocol, today's visit has been an evaluation only. We have not taken over the patient's controlled substance management.  Problem List Items Addressed This Visit      High   Chronic cervical radicular pain (C4/C5 dermatomes) (Chronic)   Chronic knee  pain (Location of Primary Source of Pain) (Bilateral) (R>L) (Chronic)   Relevant Medications   meloxicam (MOBIC) 7.5 MG tablet   HYDROcodone-acetaminophen (NORCO/VICODIN) 5-325 MG tablet   Chronic low [place] pain (Location of Secondary source of pain) (Bilateral) (L>R) (Chronic)   Relevant Medications   meloxicam (MOBIC) 7.5 MG tablet   HYDROcodone-acetaminophen (NORCO/VICODIN) 5-325 MG tablet   Other Relevant Orders   DG Lumbar Spine Complete W/Bend (Completed)   DG Si Joints (Completed)   Chronic lower extremity pain (Bilateral) (R>L) (Chronic)   Chronic lower extremity radicular pain (Bilateral) (R>L) (Chronic)   Relevant Medications   meloxicam (MOBIC) 7.5 MG tablet   HYDROcodone-acetaminophen (NORCO/VICODIN) 5-325 MG tablet   Other Relevant Orders   DG Lumbar Spine Complete W/Bend (Completed)   Chronic pain - Primary (Chronic)   Relevant Medications   meloxicam (MOBIC) 7.5 MG tablet  HYDROcodone-acetaminophen (NORCO/VICODIN) 5-325 MG tablet   Other Relevant Orders   Comprehensive metabolic panel (Completed)   C-reactive protein (Completed)   Magnesium (Completed)   Sedimentation rate (Completed)   Vitamin B12 (Completed)   25-Hydroxyvitamin D Lcms D2+D3   Ambulatory referral to Psychology   Chronic shoulder pain (Location of Tertiary source of pain) (Bilateral) (L>R) (Chronic)   Chronic upper [place] pain (between shoulder blades) (Bilateral) (R>L) (Chronic)   Relevant Medications   meloxicam (MOBIC) 7.5 MG tablet   HYDROcodone-acetaminophen (NORCO/VICODIN) 5-325 MG tablet   Other Relevant Orders   DG Thoracic Spine 2 View (Completed)   Osteoarthritis of knee (Bilateral) (Chronic)   Relevant Medications   meloxicam (MOBIC) 7.5 MG tablet   HYDROcodone-acetaminophen (NORCO/VICODIN) 5-325 MG tablet   Other Relevant Orders   DG Knee 1-2 Views Left (Completed)   DG Knee 1-2 Views Right (Completed)   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)   Relevant Medications    meloxicam (MOBIC) 7.5 MG tablet   HYDROcodone-acetaminophen (NORCO/VICODIN) 5-325 MG tablet   Radicular pain of shoulder (Chronic)   Relevant Orders   DG Cervical Spine Complete (Completed)   Tricompartmental knee arthropathy (Bilateral) (Chronic)   Relevant Medications   meloxicam (MOBIC) 7.5 MG tablet   HYDROcodone-acetaminophen (NORCO/VICODIN) 5-325 MG tablet     Medium   Encounter for pain management planning   Encounter for therapeutic drug level monitoring   Long term current use of opiate analgesic (Chronic)   Relevant Orders   Ambulatory referral to Psychology   Long term prescription opiate use (Chronic)   Relevant Orders   Compliance Drug Analysis, Ur   MRSA (methicillin resistant staph aureus) culture positive   Opiate use (20 MME/Day) (Chronic)    Other Visit Diagnoses   None.     Pharmacotherapy (Medications Ordered): No orders of the defined types were placed in this encounter.   Lab-work & Procedure Ordered: Orders Placed This Encounter  Procedures  . DG Knee 1-2 Views Left  . DG Knee 1-2 Views Right  . DG Cervical Spine Complete  . DG Lumbar Spine Complete W/Bend  . DG Si Joints  . DG Thoracic Spine 2 View  . Comprehensive metabolic panel  . C-reactive protein  . Magnesium  . Sedimentation rate  . Vitamin B12  . 25-Hydroxyvitamin D Lcms D2+D3  . Compliance Drug Analysis, Ur  . Ambulatory referral to Psychology    Interventional Therapies: Scheduled:  None at this time.    Considering:   1. Diagnostic bilateral intra-articular knee injection without fluoroscopy or IV sedation.  2. Possible series of 5 bilateral Hyalgan intra-articular knee injections without fluoroscopy or IV sedation.  3. Diagnostic bilateral genicular nerve blocks under fluoroscopic guidance, with or without sedation.  4. Possible bilateral genicular nerve radiofrequency ablation under fluoroscopic guidance and IV sedation the pending on the results of the diagnostic  injections.  5. Diagnostic bilateral lumbar facet block under fluoroscopic guidance and IV sedation.  6. Possible bilateral lumbar facet radiofrequency ablation under fluoroscopic guidance and IV sedation the pending on the results of the diagnostic injection.  7. Diagnostic bilateral intra-articular shoulder joint injection under fluoroscopic guidance, with or without sedation.  8. Diagnostic bilateral suprascapular nerve block under fluoroscopic guidance, with or without sedation.  9. Possible bilateral suprascapular nerve radiofrequency ablation under fluoroscopic guidance and IV sedation.  10. Diagnostic right-sided L5-S1 transforaminal epidural steroid injection under fluoroscopic guidance, with or without sedation. 11. Diagnostic left sided S1 transforaminal epidural steroid injection under fluoroscopic guidance, with  or without sedation.  12. Diagnostic right-sided L4-5 lumbar epidural steroid injection under fluoroscopic guidance, with or without sedation.    PRN Procedures:  None at this time.    Referral(s) or Consult(s): Medical psychology consult for substance use disorder evaluation  Medications administered during this visit: Ms. Pesci had no medications administered during this visit.  Prescriptions ordered during this visit: New Prescriptions   No medications on file    Requested PM Follow-up: Return for After [facility] Eval.  No future appointments.   Primary Care Physician: Kirk Ruths., MD Location: [facility] Note by: Kathlen Brunswick. Dossie Arbour, M.D, DABA, DABAPM, DABPM, DABIPP, FIPP  Pain Score Disclaimer: We use the NRS-11 scale. This is a self-reported, subjective measurement of pain severity with only modest accuracy. It is used primarily to identify changes within a particular patient. It must be understood that outpatient pain scales are significantly less accurate that those used for research, where they can be applied under  ideal controlled circumstances with minimal exposure to variables. In reality, the score is likely to be a combination of pain intensity and pain affect, where pain affect describes the degree of emotional arousal or changes in action readiness caused by the sensory experience of pain. Factors such as social and work situation, setting, emotional state, anxiety levels, expectation, and prior pain experience may influence pain perception and show large inter-individual differences that may also be affected by time variables.  Patient instructions provided during this appointment: Patient Instructions  Instructed to get labs and xrays done in the medical mall today.

## 2015-12-28 NOTE — Patient Instructions (Signed)
Instructed to get labs and xrays done in the medical mall today. 

## 2015-12-31 LAB — 25-HYDROXY VITAMIN D LCMS D2+D3
25-Hydroxy, Vitamin D-2: 2.5 ng/mL
25-Hydroxy, Vitamin D: 24 ng/mL — ABNORMAL LOW

## 2015-12-31 LAB — 25-HYDROXYVITAMIN D LCMS D2+D3: 25-HYDROXY, VITAMIN D-3: 21 ng/mL

## 2016-01-05 LAB — COMPLIANCE DRUG ANALYSIS, UR: PDF: 0

## 2016-01-05 NOTE — Progress Notes (Signed)
Results were reviewed and found to be: abnormal    Review would suggest interventional pain management techniques may be of benefit  No acute injury or pathology identified

## 2016-01-05 NOTE — Progress Notes (Signed)
Results were reviewed and found to be: mildly abnormal    Review would suggest interventional pain management techniques may be of benefit  No acute injury or pathology identified

## 2016-01-23 ENCOUNTER — Emergency Department
Admission: EM | Admit: 2016-01-23 | Discharge: 2016-01-23 | Disposition: A | Discharge status: Home / Self Care | Payer: Medicare HMO | Attending: Emergency Medicine | Admitting: Emergency Medicine

## 2016-01-23 ENCOUNTER — Encounter: Payer: Self-pay | Admitting: Emergency Medicine

## 2016-01-23 DIAGNOSIS — M545 Low back pain: Secondary | ICD-10-CM | POA: Insufficient documentation

## 2016-01-23 DIAGNOSIS — J449 Chronic obstructive pulmonary disease, unspecified: Secondary | ICD-10-CM | POA: Diagnosis not present

## 2016-01-23 DIAGNOSIS — G8929 Other chronic pain: Secondary | ICD-10-CM | POA: Diagnosis not present

## 2016-01-23 DIAGNOSIS — J45909 Unspecified asthma, uncomplicated: Secondary | ICD-10-CM | POA: Diagnosis not present

## 2016-01-23 DIAGNOSIS — I10 Essential (primary) hypertension: Secondary | ICD-10-CM | POA: Diagnosis not present

## 2016-01-23 DIAGNOSIS — Z79899 Other long term (current) drug therapy: Secondary | ICD-10-CM | POA: Diagnosis not present

## 2016-01-23 DIAGNOSIS — Z87891 Personal history of nicotine dependence: Secondary | ICD-10-CM | POA: Diagnosis not present

## 2016-01-23 MED ORDER — OXYCODONE-ACETAMINOPHEN 5-325 MG PO TABS
1.0000 | ORAL_TABLET | Freq: Once | ORAL | Status: AC
  Administered 2016-01-23: 1 via ORAL
  Filled 2016-01-23: qty 1

## 2016-01-23 MED ORDER — OXYCODONE-ACETAMINOPHEN 5-325 MG PO TABS: 1.0000 | ORAL_TABLET | ORAL | 0 refills | Status: DC | PRN

## 2016-01-23 NOTE — ED Triage Notes (Addendum)
Pt states she has bilateral leg and knee pain along with back pain.  Pt states she has had multiple procedures in the past for her knees.  Pt has hx of HTN and states she took her BP meds this morning around 0600. Pt takes ibuprofen for pain which she took 3-4 last night.  Pt able to ambulate but states the pain had increased overnight.

## 2016-01-23 NOTE — ED Triage Notes (Signed)
Pt denies any chest pain, shortness of breath or headache. Pt states her BP goes up when she is in pain.  Pt states she ran out of pain medication earlier in August and has not been seen at the pain clinic yet.

## 2016-01-23 NOTE — ED Provider Notes (Signed)
[facility] Emergency Department Provider Note  ____________________________________________   First MD Initiated Contact with Patient 01/23/16 0813     (approximate)  I have reviewed the triage vital signs and the nursing notes.   HISTORY  Chief Complaint Back Pain and Knee Pain    HPI Jennifer Rivers is a 70 y.o. female chief complaint of low back pain which has gotten worse in the last several days. Patient states she was unable to sleep last night due to discomfort and has been taking ibuprofen 3 tablets every 4 hours which is now upset her stomach. In the past she has seen Dr. Neva SeatH.B. Kernodle for back pain and is trying to get in with a pain clinic at this time. She currently does not have any pain medication and has not taken any Vicodin since June. Patient denies any incontinence of bowel or bladder, no saddle paresthesias. She denies any radicular type pain at this time. Pain is located L5-S1 bilaterally. She denies any urinary symptoms or history of kidney stones. Patient states she is on blood pressure medication and took her medication at 6 AM. Patient rates her pain as a 10 out of 10 at this time. Walking and standing increases her pain as well is movement. Patient states that she has felt no position that is given her any relief.   Past Medical History:  Diagnosis Date  . Asthma   . GERD (gastroesophageal reflux disease)   . Hypertension   . MRSA (methicillin resistant staph aureus) culture positive 08/2015  . Multilevel degenerative disc disease   . Osteoarthritis   . Osteoporosis     Patient Active Problem List   Diagnosis Date Noted  . Chronic knee pain (Location of Primary Source of Pain) (Bilateral) (R>L) 12/28/2015  . Long term current use of opiate analgesic 12/28/2015  . Long term prescription opiate use 12/28/2015  . Opiate use (20 MME/Day) 12/28/2015  . Encounter for pain management planning 12/28/2015  . Encounter for therapeutic  drug level monitoring 12/28/2015  . Chronic low back pain (Location of Secondary source of pain) (Bilateral) (L>R) 12/28/2015  . Chronic pain 12/28/2015  . Osteoarthritis of knee (Bilateral) 12/28/2015  . Tricompartmental knee arthropathy (Bilateral) 12/28/2015  . Chronic upper back pain (between shoulder blades) (Bilateral) (R>L) 12/28/2015  . Chronic shoulder pain (Location of Tertiary source of pain) (Bilateral) (L>R) 12/28/2015  . 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 Chronic lower extremity pain (Bilateral) (R>L) 12/28/2015  . Chronic lower extremity radicular pain (Bilateral) (R>L) 12/28/2015  . MRSA (methicillin resistant staph aureus) culture positive 12/28/2015  . Radicular pain of shoulder 12/28/2015  . Chronic cervical radicular pain (C4/C5 dermatomes) 12/28/2015  . Current tear of meniscus 08/23/2015  . 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 Atypical migraine 07/14/2015  . COPD (chronic obstructive pulmonary disease) (HCC) 07/14/2015  . Generalized OA 07/14/2015  . BP (high blood pressure) 07/14/2015  . Major depression in remission (HCC) 07/14/2015    Past Surgical History:  Procedure Laterality Date  . ABDOMINAL HYSTERECTOMY    . KNEE ARTHROSCOPY Right 09/15/2015   Procedure: ARTHROSCOPY KNEE partial medial and lateral menisectomy, chondroplasty;  Surgeon: Erin SonsHarold Kernodle, MD;  Location: [facility] ORS;  Service: Orthopedics;  Laterality: Right;  . TONSILLECTOMY      Prior to Admission medications   Medication Sig Start Date End Date Taking? Authorizing Provider  albuterol (VENTOLIN HFA) 108 (90 BASE) MCG/ACT inhaler Take 2 puffs by mouth every 6 (six) hours as needed.  01/09/15   Historical Provider, MD  carvedilol (COREG) 6.25 MG tablet Take 6.25 mg by mouth 2 (two) times daily with a meal.    Historical Provider, MD  meloxicam (MOBIC) 7.5 MG tablet Take 7.5 mg by mouth daily.  07/14/15 07/13/16  Historical Provider, MD  omeprazole  (PRILOSEC) 20 MG capsule Take 20 mg by mouth.    Historical Provider, MD  oxyCODONE-acetaminophen (PERCOCET) 5-325 MG tablet Take 1 tablet by mouth every 4 (four) hours as needed for severe pain. 01/23/16   Tommi Rumps, PA-C  traZODone (DESYREL) 50 MG tablet Take 50 mg by mouth at bedtime as needed for sleep.    Historical Provider, MD    Allergies Bupropion; Codeine; Gabapentin; Penicillins; and Sulfa antibiotics  Family History  Problem Relation Age of Onset  . Diabetes Mother   . Heart attack Father     Social History Social History  Substance Use Topics  . Smoking status: Former Smoker    Packs/day: 0.50    Types: Cigarettes    Quit date: 08/06/2015  . Smokeless tobacco: Never Used  . Alcohol use No    Review of Systems Constitutional: No fever/chills Cardiovascular: Denies chest pain. Respiratory: Denies shortness of breath. Gastrointestinal: No abdominal pain.  No nausea, no vomiting.   Genitourinary: Negative for dysuria.Negative for hematuria. Musculoskeletal: Positive for acute and chronic low back pain. Skin: Negative for rash. Neurological: Negative for headaches, focal weakness or numbness.  10-point ROS otherwise negative.  ____________________________________________   PHYSICAL EXAM:  VITAL SIGNS: ED Triage Vitals  Enc Vitals Group     BP 01/23/16 0745 (!) 209/114     Pulse Rate 01/23/16 0745 (!) 104     Resp 01/23/16 0745 20     Temp 01/23/16 0745 98.3 F (36.8 C)     Temp src --      SpO2 01/23/16 0745 98 %     Weight 01/23/16 0749 155 lb (70.3 kg)     Height 01/23/16 0749 5\' 2"  (1.575 m)     Head Circumference --      Peak Flow --      Pain Score 01/23/16 0749 8     Pain Loc --      Pain Edu? --      Excl. in GC? --     Constitutional: Alert and oriented. Well appearing and in no acute distress. Eyes: Conjunctivae are normal. PERRL. EOMI. Head: Atraumatic. Nose: No congestion/rhinnorhea. Neck: No stridor.   Cardiovascular: Normal  rate, regular rhythm. Grossly normal heart sounds.  Good peripheral circulation. Respiratory: Normal respiratory effort.  No retractions. Lungs CTAB. Gastrointestinal: Soft and nontender. No distention.  Musculoskeletal: On examination of the lower back there is no gross deformity. There is moderate tenderness on palpation of the L5-S1 area midline and bilateral paraspinous muscles. Range of motion is guarded secondary to discomfort. No active muscle spasms are seen. Straight leg raises were approximately 40 with discomfort bilaterally. Patient has bilateral degenerative appearing knees. No edema is noted in the lower extremities. Neurologic:  Normal speech and language. No gross focal neurologic deficits are appreciated. Reflexes are 1+ bilaterally. Muscle strength is equal bilaterally. Skin:  Skin is warm, dry and intact. No rash noted. Psychiatric: Mood and affect are normal. Speech and behavior are normal.  ____________________________________________   LABS (all labs ordered are listed, but only abnormal results are displayed)  Labs Reviewed - No data to display   PROCEDURES  Procedure(s) performed: None  Procedures  Critical Care  performed: No  ____________________________________________   INITIAL IMPRESSION / ASSESSMENT AND PLAN / ED COURSE  Pertinent labs & imaging results that were available during my care of the patient were reviewed by me and considered in my medical decision making (see chart for details).    Clinical Course   Patient is given Percocet while in the emergency room and within 30 minutes her pain had decreased from a 10 to an 8. Patient was able to eat a biscuit that family members brought for her.  Patient is to discontinue taking ibuprofen. Patient was also instructed to follow-up with Dr. Dareen Piano about her blood pressure. She is to call Dr. Gavin Potters will on Tuesday to follow-up with her back pain and also facilitate her appointment with the pain  clinic. Patient is return to the emergency room if any severe worsening of her symptoms during the holiday weekend.  ____________________________________________   FINAL CLINICAL IMPRESSION(S) / ED DIAGNOSES  Final diagnoses:  Acute exacerbation of chronic low back pain      NEW MEDICATIONS STARTED DURING THIS VISIT:  Discharge Medication List as of 01/23/2016  9:08 AM    START taking these medications   Details  oxyCODONE-acetaminophen (PERCOCET) 5-325 MG tablet Take 1 tablet by mouth every 4 (four) hours as needed for severe pain., Starting Sun 01/23/2016, Print         Note:  This document was prepared using Dragon voice recognition software and may include unintentional dictation errors.    Tommi Rumps, PA-C 01/23/16 0920    Nita Sickle, MD 01/23/16 2204

## 2016-01-23 NOTE — Discharge Instructions (Signed)
Discontinue taking ibuprofen. Take Percocet only as directed. Be aware that this medication could cause drowsiness increase your risk for falling. Make an appointment with Dr. Dareen PianoAnderson to have a blood pressure rechecked as it was elevated in the emergency room. Follow-up with Dr. Gavin PottersKernodle will about your back pain. He may also facilitate an appointment with the pain clinic.

## 2016-01-30 NOTE — Progress Notes (Signed)
The combined elevation of the ESR & CRP, may be suggestive of an autoimmune disease. Should this be the case, we will inquire if the patient has had a rheumatologic evaluation looking at  the RF levels, ANA levels, and CBC.Normal levels of C-Reactive Protein for our Lab are less than 1.0 mg/L. C-reactive protein (CRP) is produced by the liver. The level of CRP rises when there is inflammation throughout the body. CRP goes up in response to inflammation. High levels suggests the presence of chronic inflammation but do not identify its location or cause. High levels have been observed in obese patients, individuals with bacterial infections, chronic inflammation, or flare-ups of inflammatory conditions. Drops of previously elevated levels suggest that the inflammation or infection is subsiding and/or responding to treatment.A normal sedimentation rate should be below 30 mm/hr. The sed rate is an acute phase reactant that indirectly measures the degree of inflammation present in the body. It can be acute, developing rapidly after trauma, injury or infection, for example, or can occur over an extended time (chronic) with conditions such as autoimmune diseases or cancer. The ESR is not diagnostic; it is a non-specific, screening test that may be elevated in a number of these different conditions. It provides general information about the presence or absence of an inflammatory condition.

## 2016-01-30 NOTE — Progress Notes (Signed)
Normal Vitamin B-12 levels are between 180 and 914 pg/mL, for our Lab. Patients with levels below 180 pg/ml are considered to have a Vitamin B-12 "deficiency". Older patients with levels between 200 and 500 may be symptomatic, in which case it is considered an "insufficiency". Symptoms of deficiency or insufficiency may include: tingling and numbness of the digits (fingers & toes), generalized muscle weakness, staggering, irritability, confusion, forgetfulness, tenderness, fatigue, shortness of breath, palpitation, anemia, sporadic episodes of diarrhea, decreased immune system, cognitive impairment, and degeneration of the posterior sensory columns of the spinal cord. Deficiency can lead to anemia and congestive heart failure. Lack of vitamin B12 may lead to peripheral neuropathy. The recommended over-the-counter Vitamin B12 dose intake for deficiency is 125 to 2,000 micrograms of cyanocobalamin taken by mouth, daily.

## 2016-01-30 NOTE — Progress Notes (Signed)
Low levels of sodium in blood is known as "Hyponatremia". When severe, symptoms can include: headaches; confusion or altered mental state; seizures; decreased consciousness which can proceed to coma and death. Less severe symptoms include: restlessness; muscle spasms or cramps; weakness, and tiredness. Causes may include: chronic conditions such as kidney failure (when excess fluid cannot be efficiently excreted) and congestive heart failure, in which excess fluid accumulates in the body. SIADH (syndrome of inappropriate anti-diuretic hormone) is a disease whereby the body produces too much anti-diuretic hormone (ADH), resulting in retention of water in the body. These are considered to be dilutional causes. In addition, it can also result when sodium is lost from the body as is the case during prolonged sweating and severe vomiting or diarrhoea. Medical conditions that can sometimes be associated with hyponatraemia include adrenal insufficiency, hypothyroidism, and cirrhosis of the liver. The eating disorder anorexia can also cause a sodium imbalance. Some drugs can lower blood sodium levels. Examples of these include diuretics (water tablets), desmopressin, and sulfonylureas. Normal chloride levels are between 95 and 107 mEq/L. Low levels may be due to: Addison disease; Bartter syndrome; burns; congestive heart failure; dehydration; excessive sweating; hyperaldosteronism; metabolic alkalosis; respiratory acidosis (compensated); Syndrome of inappropriate diuretic hormone secretion (SIADH); or vomiting. Normal fasting (NPO x 8 hours) glucose levels are between 65-99 mg/dl, with 2 hour fasting, levels are usually less than 140 mg/dl. Any random blood glucose level greater than 200 mg/dl is considered to be Diabetes. Normal Creatinine levels are between 0.5 and 0.9 mg/dl for our lab. Any condition that impairs the function of the kidneys is likely to raise the creatinine level in the blood. The most common causes  of longstanding (chronic) kidney disease in adults are high blood pressure and diabetes. Other causes of elevated blood creatinine levels include drugs, ingestion of a large amount of dietary meat, kidney infections, rhabdomyolysis (abnormal muscle breakdown), and urinary tract obstruction.  Results of a total protein test are usually considered along with those from other tests of the CMP and will give the healthcare practitioner information on a person's general health status with regard to nutrition and/or conditions involving major organs, such as the kidney and liver. A high total protein level may be seen with chronic inflammation or infections such as viral hepatitis or HIV. It also may be associated with bone marrow disorders such as multiple myeloma. Possible causes of high blood protein include: Bone marrow disorder Multiple myeloma Amyloidosis Monoclonal gammopathy of undetermined significance (MGUS) Chronic inflammatory conditions HIV/AIDS Dehydration (which may make blood proteins appear falsely elevated)  A high-protein diet doesn't cause high blood protein.  High blood protein is not a specific disease or condition in itself. It's usually a laboratory finding uncovered during the evaluation of a particular condition or symptom. For instance, although high blood protein is found in people who are dehydrated, the real problem is that the blood plasma is actually more concentrated.  Certain proteins in the blood may be elevated as your body fights an infection or some other inflammation. People with certain bone marrow diseases, such as multiple myeloma, may have high blood protein levels before they show any other symptoms. This is a blood test that measures the amount of a substance called bilirubin. This test is used to find out how well your liver is working. It is often given as part of a panel of tests that measure liver function. A small amount of bilirubin in your blood is  normal, but a high level may  be a sign of liver disease.  The liver makes bile to help you digest food, and bile contains bilirubin. Most bilirubin comes from the body's normal process of breaking down old red blood cells. A healthy liver can normally get rid of bilirubin. But when you have liver problems, it can build up in your body to unhealthy levels.  The reference range of total bilirubin is 0.3 - 1.2 mg/dL. The reference range of direct bilirubin is 0.1-0.4 mg/dL.  Low levels of bilirubin (<0.3) are generally not concerning and are not monitored. eGFR (Estimated Glomerular Filtration Rate) results are reported as milliliters/minute/1.74m (mL/min/1.711m. Because some laboratories do not collect information on a patient's race when the sample is collected for testing, they may report calculated results for both African Americans and non-African Americans.  The [employer] only reporting actual results once values are < 60 mL/min. 1. Normal values: 90-120 mL/min 2. Below 60 mL/min suggests that some kidney damage has occurred. 3. Between [id number] 30 indicate (Moderate) Stage 3 kidney disease. 4. Between 29 and 15 represent (Severe) Stage 4 kidney disease. 5. Less than 15 is considered (Kidney Failure) Stage 5.

## 2016-01-30 NOTE — Progress Notes (Signed)
Low Vitamin D Results Normal levels: between 30 and 100 ng/mL. Vitamin D Insufficiency: Levels between 20-30 ng/ml are defined as a "Vitamin D insufficiency". Vitamin D Deficiency: Levels below 20 ng/ml, is diagnosed as a "Vitamin D Deficiency".  Common causes include: dietary insufficiency; inadequate sun exposure; inability to absorb vitamin D from the intestines; or inability to process it due to kidney or liver disease. Low 25-hydroxyvitamin D: A low blood level of 25-hydroxyvitamin D may mean that a person is not getting enough exposure to sunlight or enough dietary vitamin D to meet his or her body's demand or that there is a problem with its absorption from the intestines. Occasionally, drugs used to treat seizures, particularly phenytoin (Dilantin), can interfere with the production of 25-hydroxyvitamin D in the liver. There is some evidence that vitamin D deficiency may increase the risk of some cancers, immune diseases, and cardiovascular disease. Low 1,25-dihydroxyvitamin D: A low level of 1,25-dihydroxyvitamin D can be seen in kidney disease and is one of the earliest changes to occur in persons with early kidney failure. Associated complications may include: hypocalcemia, hypophosphatemia, and reduced bone density. Associated symptoms: Vitamin D deficiencies and insufficiencies may be associated with fatigue, weakness, bone pain, joint pain, and muscle pain. Recommendations: Patient may benefit from taking over-the-counter Vitamin D3 supplements. I recommend a vitamin D + Calcium supplements. "Natures Bounty", a brand easily found in most pharmacies, has a formulation containing Calcium 1200 mg plus Vitamin D3 1000 IU, in Softgels capsules that are easy to swallow. This should be taken once a day, preferably in the morning as vitamin D will increase energy levels and make it difficult to fall asleep, if taken at night. Patients with levels lower than 20 ng/ml should contact their primary care  physicians to receive replacement therapy. Vitamin D3 can be obtained over-the-counter, without a prescription. Vitamin D2 requires a prescription and it is used for replacement therapy.   

## 2016-02-03 ENCOUNTER — Other Ambulatory Visit
Admission: RE | Admit: 2016-02-03 | Discharge: 2016-02-03 | Disposition: A | Discharge status: Home / Self Care | Payer: Medicare HMO | Source: Ambulatory Visit | Attending: Pain Medicine | Admitting: Pain Medicine

## 2016-02-03 ENCOUNTER — Ambulatory Visit: Payer: Medicare HMO | Attending: Pain Medicine | Admitting: Pain Medicine

## 2016-02-03 ENCOUNTER — Encounter: Payer: Self-pay | Admitting: Pain Medicine

## 2016-02-03 VITALS — BP 212/108 | HR 89 | Temp 98.3°F | Resp 16 | Ht 62.0 in | Wt 159.0 lb

## 2016-02-03 DIAGNOSIS — M4806 Spinal stenosis, lumbar region: Secondary | ICD-10-CM | POA: Diagnosis not present

## 2016-02-03 DIAGNOSIS — R7982 Elevated C-reactive protein (CRP): Secondary | ICD-10-CM

## 2016-02-03 DIAGNOSIS — M5136 Other intervertebral disc degeneration, lumbar region: Secondary | ICD-10-CM | POA: Diagnosis not present

## 2016-02-03 DIAGNOSIS — M545 Low back pain: Secondary | ICD-10-CM | POA: Diagnosis not present

## 2016-02-03 DIAGNOSIS — M25461 Effusion, right knee: Secondary | ICD-10-CM | POA: Diagnosis not present

## 2016-02-03 DIAGNOSIS — Z79899 Other long term (current) drug therapy: Secondary | ICD-10-CM | POA: Insufficient documentation

## 2016-02-03 DIAGNOSIS — M50322 Other cervical disc degeneration at C5-C6 level: Secondary | ICD-10-CM | POA: Insufficient documentation

## 2016-02-03 DIAGNOSIS — Z87891 Personal history of nicotine dependence: Secondary | ICD-10-CM | POA: Diagnosis not present

## 2016-02-03 DIAGNOSIS — G8929 Other chronic pain: Secondary | ICD-10-CM | POA: Diagnosis not present

## 2016-02-03 DIAGNOSIS — M25512 Pain in left shoulder: Secondary | ICD-10-CM

## 2016-02-03 DIAGNOSIS — M25511 Pain in right shoulder: Secondary | ICD-10-CM

## 2016-02-03 DIAGNOSIS — M40294 Other kyphosis, thoracic region: Secondary | ICD-10-CM | POA: Diagnosis not present

## 2016-02-03 DIAGNOSIS — M25561 Pain in right knee: Secondary | ICD-10-CM

## 2016-02-03 DIAGNOSIS — M79604 Pain in right leg: Secondary | ICD-10-CM | POA: Diagnosis not present

## 2016-02-03 DIAGNOSIS — Z22322 Carrier or suspected carrier of Methicillin resistant Staphylococcus aureus: Secondary | ICD-10-CM | POA: Diagnosis not present

## 2016-02-03 DIAGNOSIS — M4802 Spinal stenosis, cervical region: Secondary | ICD-10-CM | POA: Insufficient documentation

## 2016-02-03 DIAGNOSIS — F1190 Opioid use, unspecified, uncomplicated: Secondary | ICD-10-CM | POA: Diagnosis not present

## 2016-02-03 DIAGNOSIS — I10 Essential (primary) hypertension: Secondary | ICD-10-CM | POA: Diagnosis not present

## 2016-02-03 DIAGNOSIS — Z79891 Long term (current) use of opiate analgesic: Secondary | ICD-10-CM | POA: Diagnosis not present

## 2016-02-03 DIAGNOSIS — R700 Elevated erythrocyte sedimentation rate: Secondary | ICD-10-CM | POA: Diagnosis not present

## 2016-02-03 DIAGNOSIS — M79605 Pain in left leg: Secondary | ICD-10-CM | POA: Insufficient documentation

## 2016-02-03 DIAGNOSIS — M778 Other enthesopathies, not elsewhere classified: Secondary | ICD-10-CM | POA: Insufficient documentation

## 2016-02-03 DIAGNOSIS — M170 Bilateral primary osteoarthritis of knee: Secondary | ICD-10-CM | POA: Diagnosis not present

## 2016-02-03 DIAGNOSIS — K219 Gastro-esophageal reflux disease without esophagitis: Secondary | ICD-10-CM | POA: Diagnosis not present

## 2016-02-03 DIAGNOSIS — M659 Synovitis and tenosynovitis, unspecified: Secondary | ICD-10-CM | POA: Insufficient documentation

## 2016-02-03 DIAGNOSIS — M25562 Pain in left knee: Secondary | ICD-10-CM

## 2016-02-03 MED ORDER — OXYCODONE HCL 5 MG PO TABS: 5.0000 mg | ORAL_TABLET | Freq: Four times a day (QID) | ORAL | 0 refills | Status: DC | PRN

## 2016-02-03 NOTE — Progress Notes (Signed)
Safety precautions to be maintained throughout the outpatient stay will include: orient to surroundings, keep bed in low position, maintain call bell within reach at all times, provide assistance with transfer out of bed and ambulation. Patient went to ER and was given percocet for pain about two weeks ago.Oxycodone/aceta 5/325mg  filled on 01/23/2016 0/20 tablets [facility].

## 2016-02-03 NOTE — Patient Instructions (Signed)
In[facility].

## 2016-02-03 NOTE — Progress Notes (Signed)
Patient's Name: Jennifer Rivers  MRN: [id number]  Referring Provider: Kirk Ruths, MD  DOB: 03/29/1946  PCP: Kirk Ruths., MD  DOS: 02/03/2016  Note by: Kathlen Brunswick. Dossie Arbour, MD  Service setting: Ambulatory outpatient  Specialty: Interventional Pain Management  Location: [facility] (AMB) Pain Management Facility    Patient type: Established   Primary Reason(s) for Visit: Encounter for evaluation before starting new chronic pain management plan of care (Level of risk: moderate) CC: Back Pain (lower back) and Leg Pain (both legs;back of legs to the knees)  HPI  Jennifer Rivers is a 70 y.o. year old, female patient, who returns today as an established patient. She has Adult idiopathic generalized osteoporosis; Atypical migraine; COPD (chronic obstructive pulmonary disease) ([place]); Generalized OA; BP (high blood pressure); Current tear of meniscus; Major depression in remission ([place]); Chronic knee pain (Location of Primary Source of Pain) (Bilateral) (R>L); Long term current use of opiate analgesic; Long term prescription opiate use; Opiate use (20 MME/Day); Encounter for pain management planning; Encounter for therapeutic drug level monitoring; Chronic low back pain (Location of Secondary source of pain) (Bilateral) (L>R); Chronic pain; Osteoarthritis of knee (Bilateral); Tricompartmental knee arthropathy (Bilateral); Chronic upper back pain (between shoulder blades) (Bilateral) (R>L); Chronic shoulder pain (Location of Tertiary source of pain) (Bilateral) (L>R); 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 Her primarily concern today is the Back Pain (lower back) and Leg Pain (both  legs;back of legs to the knees)  Pain Assessment: Self-Reported Pain Score: 7  Clinically the patient looks like a 3/10 Reported level is inconsistent with clinical observations. Information on the proper use of the pain score provided to the patient today. Pain Type: Chronic pain Pain Location: Back Pain Orientation: Lower Pain Descriptors / Indicators: Aching, Constant, Sharp Pain Frequency: Intermittent  The patient comes into the clinics today for post-procedure evaluation on the interventional treatment done on 12/28/2015. In addition, she comes in today for pharmacological management of her chronic pain.  The patient  reports that she does not use drugs.  Date of Last Visit: 12/28/15 Service Provided on Last Visit: Evaluation (new patient visit)  Controlled Substance Pharmacotherapy Assessment & REMS (Risk Evaluation and Mitigation Strategy)  Analgesic: Hydrocodone/APAP 5/325 one tablet every 6 hours (20 mg/day of hydrocodone) MME/day: 20 mg/day Pill Count: Oxycodone/aceta 5/356m filled on 01/23/2016 0[facility] Pharmacokinetics: Onset of action (Liberation/Absorption): Within expected pharmacological parameters Time to Peak effect (Distribution): Timing and results are as within normal expected parameters Duration of action (Metabolism/Excretion): Within normal limits for medication Pharmacodynamics: Analgesic Effect: More than 50% Activity Facilitation: Medication(s) allow patient to sit, stand, walk, and do the basic ADLs Perceived Effectiveness: Described as relatively effective, allowing for increase in activities of daily living (ADL) Side-effects or Adverse reactions: None reported Monitoring: [place] PMP: Online review of the past 154-montheriod conducted. Compliant with practice rules and regulations Last UDS on record: Summary  Date Value Ref Range Status  12/28/2015 FINAL  Final    Comment:     ==================================================================== TOXASSURE COMP DRUG ANALYSIS,UR ==================================================================== Test                             Result       Flag  Units Drug Present and Declared for Prescription Verification   Trazodone                      PRESENT      EXPECTED   1,3 chlorophenyl piperazine    PRESENT      EXPECTED    1,3-chlorophenyl piperazine is an expected metabolite of    trazodone. Drug Present not Declared for Prescription Verification   Diphenhydramine                PRESENT      UNEXPECTED Drug Absent but Declared for Prescription Verification   Hydrocodone                    Not Detected UNEXPECTED ng/mg creat   Acetaminophen                  Not Detected UNEXPECTED    Acetaminophen, as indicated in the declared medication list, is    not always detected even when used as directed. ==================================================================== Test                      Result    Flag   Units      Ref Range   Creatinine              56               mg/dL      >=20 ==================================================================== Declared Medications:  The flagging and interpretation on this report are based on the  following declared medications.  Unexpected results may arise from  inaccuracies in the declared medications.  **Note: The testing scope of this panel includes these medications:  Hydrocodone (Hydrocodone-Acetaminophen)  Trazodone (Desyrel)  **Note: The testing scope of this panel does not include small to  moderate amounts of these reported medications:  Acetaminophen (Hydrocodone-Acetaminophen)  **Note: The testing scope of this panel does not include following  reported medications:  Albuterol  Carvedilol  Meloxicam (Mobic)  Omeprazole ==================================================================== For clinical consultation, please call [phone]. ====================================================================    UDS interpretation: Compliant This may be an issue with the scheduling as opposed to taking more medication than prescribed. We will make an effort to schedule the return appointment before the patient runs out of medication. Medication Assessment Form: Reviewed. Patient indicates being compliant with therapy Treatment compliance: Not applicable yet Risk Assessment: Aberrant Behavior: None observed today Substance Use Disorder (SUD) Risk Level: Low Risk of opioid abuse or dependence: 0.7-3.0% with doses ? 36 MME/day and 6.1-26% with doses ? 120 MME/day. Opioid Risk Tool (ORT) Score: Total Score: 0 Low Risk for SUD (Score <3) Depression Scale Score: PHQ-2: PHQ-2 Total Score: 0 No depression (0) PHQ-9: PHQ-9 Total Score: 0 No depression (0-4)  Pharmacologic Plan: Today we may be taking over the patient's pharmacological regimen. See below  Previous Illicit Drug Screen Labs(s): No results found for: MDMA, COCAINSCRNUR, PCPSCRNUR, THCU  Laboratory Chemistry  Inflammation Markers Lab Results  Component Value Date   ESRSEDRATE 55 (H) 12/28/2015   CRP 3.0 (H) 12/28/2015    Renal Function Lab Results  Component Value Date   BUN 18 12/28/2015   CREATININE 1.15 (H) 12/28/2015   GFRAA 56 (L) 12/28/2015   GFRNONAA 48 (L) 12/28/2015    Hepatic Function Lab Results  Component Value Date   AST 24 12/28/2015   ALT 17 12/28/2015   ALBUMIN 4.2 12/28/2015    Electrolytes Lab Results  Component Value Date   NA 134 (L) 12/28/2015   K 4.3 12/28/2015   CL 99 (L) 12/28/2015   CALCIUM 9.8 12/28/2015   MG 1.9 12/28/2015    Pain Modulating Vitamins Lab Results  Component Value Date   25OHVITD1 24 (L) 12/28/2015   25OHVITD2 2.5 12/28/2015   25OHVITD3 21 12/28/2015   VITAMINB12 176 (L) 12/28/2015    Coagulation Parameters Lab Results  Component Value Date   PLT 444 (H) 09/06/2015     Cardiovascular Lab Results  Component Value Date   HGB 9.5 (L) 09/06/2015   HCT 29.5 (L) 09/06/2015   Note: Lab results reviewed.  Recent Diagnostic Imaging  Cervical Imaging: Cervical DG complete:  Results for orders placed during the hospital encounter of 12/28/15  DG Cervical Spine Complete   Narrative CLINICAL DATA:  Pain  EXAM: CERVICAL SPINE - COMPLETE 4+ VIEW  COMPARISON:  C-spine films of 02/03/2010  FINDINGS: There is mild retrolisthesis of C5 on C6 by 2 mm most likely degenerative in origin. Mild degenerative disc disease is present at C5-6 with slight loss of joint space. The remainder of intervertebral disc spaces are well preserved. Partial fusion of C2-3 is noted. No prevertebral soft tissue swelling is seen. There is mild foraminal narrowing at C5-6 bilaterally. The remainder of the foramina are patent. The odontoid process is intact. The lung apices are clear.  IMPRESSION: 1. 2 mm retrolisthesis of C5 on C6 most likely degenerative in origin. 2. Degenerative disc disease at C5-6 with some foraminal narrowing at that level.   Electronically Signed   By: Ivar Drape M.D.   On: 12/28/2015 13:49    Thoracic Imaging: Thoracic DG 2-3 views:  Results for orders placed during the hospital encounter of 12/28/15  DG Thoracic Spine 2 View   Narrative CLINICAL DATA:  Chronic back pain  EXAM: THORACIC SPINE 2 VIEWS  COMPARISON:  Chest x-ray of 03/10/2015  FINDINGS: There is slight thoracic kyphosis present with mild degenerative change. No compression deformity is seen. No prominent paravertebral soft tissue is noted.  IMPRESSION: Thoracic kyphosis with mild degenerative spurring. No acute abnormality.   Electronically Signed   By: Ivar Drape M.D.   On: 12/28/2015 13:55    Lumbosacral Imaging: Lumbar MR wo contrast:  Results for orders placed in visit on 11/24/10  MR [place] W/O Cm   Narrative * PRIOR REPORT IMPORTED FROM AN  EXTERNAL SYSTEM *   PRIOR REPORT IMPORTED FROM THE [facility] [place] EXAM:    back and left leg pain numbness unresponsive to phy  therapy and meds  COMMENTS:   PROCEDURE:     MMR - MMR LUMBAR SPINE WO CONTRAST  - Nov 24 2010 11:13AM   RESULT:     Comparison: 05/26/2006   Technique: Standard lumbar spine protocol, without administration of IV  contrast.   Findings:  There is normal alignment. Bone marrow endplate reactive changes are seen  at  L4-L5, with intervertebral disc height loss at this level. Vertebral body  heights are maintained. The conus termination is normal.   T12-L1: No significant disc bulge or neuroforaminal narrowing.   L1-L2: No significant disc bulge or neuroforaminal narrowing.   L2-L3: No significant disc bulge or neuroforaminal narrowing.   L3-L4: Mild posterior disc bulge with superimposed small left paracentral  disc protrusion cause mild mass effect on the ventral thecal sac. There is  mild canal stenosis. These findings are slightly increased from prior. No  neuroforaminal narrowing. There is mild degenerative facet disease.   L4-L5: Mild posterior disc bulge causes flattening of thecal sac. There is  mild degenerative facet disease. There is moderate right neuroforaminal  narrowing, which is increased from prior.   L5-S1: Mild posterior disc bulge causes minimal flattening of the ventral  thecal sac. There is mild degenerative facet disease. Mild right  neuroforaminal narrowing. This is similar to prior.   IMPRESSION:  Multilevel degenerative disc and facet disease, slightly increased at  L3-L4  where there is now mild canal stenosis. There is moderate right  neuroforaminal narrowing at L4-L5 which is increased from prior.       Lumbar DG 2-3 views:  Results for orders placed in visit on 10/03/07  DG Lumbar Spine 2-3 Views   Narrative * PRIOR REPORT IMPORTED FROM AN EXTERNAL SYSTEM *   PRIOR REPORT IMPORTED FROM THE  [facility] WORKFL[facility] SYSTEM   REASON FOR EXAM:    Fall  COMMENTS:   PROCEDURE:     DXR - DXR LUMBAR SPINE AP AND LATERAL  - Oct 03 2007   3:58PM   RESULT:     The vertebral body heights are well maintained. No fracture is  seen. The vertebral body alignment is normal. There is narrowing of the  L3-4, L4-5 and L5-S1 intervertebral disc spaces consistent with disc  disease  at multiple levels. This could be further evaluated by MRI, if such is  clinically indicated. The pedicles are bilaterally intact.   IMPRESSION:   1.  No fracture is seen.  2.  Vertebral body alignment is normal.  3.  There is slight degenerative spurring anteriorly at multiple levels.  4.  There are changes consistent with disc disease at multiple levels of  the  lower lumbar spine. This can be further evaluated by MRI, if clinically  indicated.   Thank you for this opportunity to contribute to the care of your patient.       Lumbar DG Bending views:  Results for orders placed during the hospital encounter of 12/28/15  DG Lumbar Spine Complete W/Bend   Narrative CLINICAL DATA:  Chronic back pain  EXAM: LUMBAR SPINE - COMPLETE WITH BENDING VIEWS  COMPARISON:  MR lumbar spine of 11/24/2010 and lumbar spine plain films of 10/03/2007  FINDINGS: The lumbar vertebrae are in normal alignment. There is degenerative disc disease at C3-4 and C4-5 level which has progressed somewhat since the images from 2009, with little change since the MRI in 2012. Through flexion and extension there is normal range of motion with no malalignment. 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  IMPRESSION: 1. Degenerative disc disease at L3-4 and L4-5 levels. 2. Normal range of motion through flexion and extension.   Electronically Signed   By: Ivar Drape M.D.   On:  12/28/2015 13:52    Sacroiliac Joint Imaging: Sacroiliac Joint DG:  Results for orders placed during the hospital encounter of 12/28/15  DG Si Joints   Narrative CLINICAL DATA:  Chronic back pain  EXAM: BILATERAL SACROILIAC JOINTS - 3+ VIEW  COMPARISON:  Lumbar spine films of 10/03/2007  FINDINGS: The SI joints appear well corticated. There is no evidence of sacroiliitis. The sacral foramina also are well corticated. No bony abnormality is seen.  IMPRESSION: No evidence of sacroiliitis.   Electronically Signed   By: Eddie Dibbles  Alvester Chou M.D.   On: 12/28/2015 13:54    Knee Imaging: Knee-R MR wo contrast:  Results for orders placed during the hospital encounter of 08/18/15  MR Knee Right Wo Contrast   Narrative CLINICAL DATA:  Knee pain for 6 months. Limited range of motion. No prior surgery.  EXAM: MRI OF THE RIGHT KNEE WITHOUT CONTRAST  TECHNIQUE: Multiplanar, multisequence MR imaging of the knee was performed. No intravenous contrast was administered.  COMPARISON:  None.  FINDINGS: MENISCI  Medial meniscus: Fairly extensive complex flap type tear involving the posterior horn and entire midbody region.  Lateral meniscus: Superior and inferior articular surface tears involving the posterior horn.  LIGAMENTS  Cruciates:  Intact.  Collaterals:  Intact.  CARTILAGE  Patellofemoral: Advanced degenerative chondrosis with areas of full-thickness cartilage loss along the patellar apex and underlying subchondral cystic change (grade 4 chondromalacia).  Medial: Moderate to advanced degenerative chondrosis with associated joint space narrowing and osteophytic spurring.  Lateral: Moderate degenerative chondrosis with early joint space narrowing and osteophytic spurring.  Joint: Large joint effusion and moderate synovitis. Patellar plica are also noted.  Popliteal Fossa:  No popliteal mass or Baker's cyst.  Extensor Mechanism: The patella retinacular structures  are intact and the quadriceps and patellar tendons are intact. Mild lateral orientation of the patella in relation to the femoral trochlear groove.  Bones: No acute bony findings. Tricompartmental degenerative changes.  IMPRESSION: 1. Extensive complex flap type tear involving the posterior horn and mid body region of the medial meniscus. 2. Superior and inferior articular surface tears involving the posterior horn of the lateral meniscus. 3. Intact ligamentous structures and no acute bony findings. 4. Tricompartmental degenerative changes as discussed above. 5. Large joint effusion and moderate synovitis.   Electronically Signed   By: Marijo Sanes M.D.   On: 08/18/2015 13:36    Knee-R DG 1-2 views:  Results for orders placed during the hospital encounter of 12/28/15  DG Knee 1-2 Views Right   Narrative CLINICAL DATA:  Chronic knee pain, prior right knee arthroscopy  EXAM: RIGHT KNEE - 1-2 VIEW  COMPARISON:  MR right knee of 08/18/2015  FINDINGS: There is bicompartmental degenerative joint disease the right knee primarily involving the medial and patellofemoral compartments with some loss of joint space and spurring. No fracture is seen. However there may be a small amount of joint fluid present.  IMPRESSION: 1. 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 2. Apparent small right knee joint effusion.   Electronically Signed   By: Ivar Drape M.D.   On: 12/28/2015 13:47    Knee-L DG 1-2 views:  Results for orders placed during the hospital encounter of 12/28/15  DG Knee 1-2 Views Left   Narrative CLINICAL DATA:  Chronic knee pain  EXAM: LEFT KNEE - 1-2 VIEW  COMPARISON:  None.  FINDINGS: Two views of the left knee show primarily medial compartment degenerative joint disease with loss of joint space and some sclerosis with spurring. Both the lateral compartment and patellofemoral compartments are relatively well preserved. No joint effusion  is seen. No fractures noted.  IMPRESSION: Degenerative joint disease involving the medial compartment. No acute abnormality.   Electronically Signed   By: Ivar Drape M.D.   On: 12/28/2015 13:45    Note: Imaging results reviewed.  Meds  The patient has a current medication list which includes the following prescription(s): albuterol, carvedilol, meloxicam, omeprazole, and oxycodone.  Current Outpatient Prescriptions on File Prior to Visit  Medication Sig  . albuterol (VENTOLIN  HFA) 108 (90 BASE) MCG/ACT inhaler Take 2 puffs by mouth every 6 (six) hours as needed.  . carvedilol (COREG) 6.25 MG tablet Take 6.25 mg by mouth 2 (two) times daily with a meal.  . meloxicam (MOBIC) 7.5 MG tablet Take 7.5 mg by mouth daily.   Marland Kitchen omeprazole (PRILOSEC) 20 MG capsule Take 20 mg by mouth daily.    No current facility-administered medications on file prior to visit.    ROS  Constitutional: Denies any fever or chills Gastrointestinal: No reported hemesis, hematochezia, vomiting, or acute GI distress Musculoskeletal: Denies any acute onset joint swelling, redness, loss of ROM, or weakness Neurological: No reported episodes of acute onset apraxia, aphasia, dysarthria, agnosia, amnesia, paralysis, loss of coordination, or loss of consciousness  Allergies  Jennifer Rivers is allergic to bupropion; codeine; gabapentin; tramadol; penicillins; and sulfa antibiotics.  [place]  Medical:  Jennifer Rivers  has a past medical history of Asthma; GERD (gastroesophageal reflux disease); Hypertension; MRSA (methicillin resistant staph aureus) culture positive (08/2015); Multilevel degenerative disc disease; Osteoarthritis; and Osteoporosis. Family: family history includes Diabetes in her mother; Heart attack in her father. Surgical:  has a past surgical history that includes Abdominal hysterectomy; Tonsillectomy; and Knee arthroscopy (Right, 09/15/2015). Tobacco:  reports that she quit smoking about 5 months ago. Her  smoking use included Cigarettes. She smoked 0.50 packs per day. She has never used smokeless tobacco. Alcohol:  reports that she does not drink alcohol. Drug:  reports that she does not use drugs.  Constitutional Exam  General appearance: Well nourished, well developed, and well hydrated. In no acute distress Vitals:   02/03/16 0837  BP: (!) 212/108  Pulse: 89  Resp: 16  Temp: 98.3 F (36.8 C)  TempSrc: Oral  SpO2: 100%  Weight: 159 lb (72.1 kg)  Height: 5' 2" (1.575 m)  BMI Assessment: Estimated body mass index is 29.08 kg/m as calculated from the following:   Height as of this encounter: 5' 2" (1.575 m).   Weight as of this encounter: 159 lb (72.1 kg).   BMI interpretation: (25-29.9 kg/m2) = Overweight: This range is associated with a 20% higher incidence of chronic pain. BMI Readings from Last 4 Encounters:  02/03/16 29.08 kg/m  01/23/16 28.35 kg/m  12/28/15 29.26 kg/m  09/15/15 27.44 kg/m   Wt Readings from Last 4 Encounters:  02/03/16 159 lb (72.1 kg)  01/23/16 155 lb (70.3 kg)  12/28/15 160 lb (72.6 kg)  09/15/15 150 lb (68 kg)  Psych/Mental status: Alert and oriented x 3 (person, place, & time) Eyes: PERLA Respiratory: No evidence of acute respiratory distress  Cervical Spine Exam  Inspection: No masses, redness, or swelling Alignment: Symmetrical Functional ROM: ROM appears unrestricted Stability: No instability detected Muscle strength & Tone: Functionally intact Sensory: Unimpaired Palpation: Non-contributory  Upper Extremity (UE) Exam    Side: Right upper extremity  Side: Left upper extremity  Inspection: No masses, redness, swelling, or asymmetry  Inspection: No masses, redness, swelling, or asymmetry  Functional ROM: ROM appears unrestricted          Functional ROM: ROM appears unrestricted          Muscle strength & Tone: Functionally intact  Muscle strength & Tone: Functionally intact  Sensory: Unimpaired  Sensory: Unimpaired  Palpation:  Non-contributory  Palpation: Non-contributory   Thoracic Spine Exam  Inspection: No masses, redness, or swelling Alignment: Symmetrical Functional ROM: ROM appears unrestricted Stability: No instability detected Sensory: Unimpaired Muscle strength & Tone: Functionally intact Palpation: Non-contributory  Lumbar  Spine Exam  Inspection: No masses, redness, or swelling Alignment: Symmetrical Functional ROM: ROM appears unrestricted Stability: No instability detected Muscle strength & Tone: Functionally intact Sensory: Unimpaired Palpation: Non-contributory Provocative Tests: Lumbar Hyperextension and rotation test: evaluation deferred today       Patrick's Maneuver: evaluation deferred today              Gait & Posture Assessment  Ambulation: Unassisted Gait: Relatively normal for age and body habitus Posture: WNL   Lower Extremity Exam    Side: Right lower extremity  Side: Left lower extremity  Inspection: No masses, redness, swelling, or asymmetry  Inspection: No masses, redness, swelling, or asymmetry  Functional ROM: ROM appears unrestricted          Functional ROM: ROM appears unrestricted          Muscle strength & Tone: Functionally intact  Muscle strength & Tone: Functionally intact  Sensory: Unimpaired  Sensory: Unimpaired  Palpation: Non-contributory  Palpation: Non-contributory    Assessment & Plan  Primary Diagnosis & Pertinent Problem List: The primary encounter diagnosis was Chronic pain. Diagnoses of Long term current use of opiate analgesic, Opiate use (20 MME/Day), MRSA (methicillin resistant staph aureus) culture positive, Elevated sed rate, Elevated C-reactive protein (CRP), Chronic knee pain (Location of Primary Source of Pain) (Bilateral) (R>L), Chronic low back pain (Location of Secondary source of pain) (Bilateral) (L>R), and Chronic shoulder pain (Location of Tertiary source of pain) (Bilateral) (L>R) were also pertinent to this visit.  Visit  Diagnosis: 1. Chronic pain   2. Long term current use of opiate analgesic   3. Opiate use (20 MME/Day)   4. MRSA (methicillin resistant staph aureus) culture positive   5. Elevated sed rate   6. Elevated C-reactive protein (CRP)   7. Chronic knee pain (Location of Primary Source of Pain) (Bilateral) (R>L)   8. Chronic low back pain (Location of Secondary source of pain) (Bilateral) (L>R)   9. Chronic shoulder pain (Location of Tertiary source of pain) (Bilateral) (L>R)     Problems updated and reviewed during this visit: Problem  Elevated Sed Rate  Elevated C-Reactive Protein (Crp)    Problem-specific Plan(s): No problem-specific Assessment & Plan notes found for this encounter.  No new Assessment & Plan notes have been filed under this hospital service since the last note was generated. Service: Pain Management   Plan of Care   Problem List Items Addressed This Visit      High   Chronic knee pain (Location of Primary Source of Pain) (Bilateral) (R>L) (Chronic)   Relevant Medications   oxyCODONE (OXY IR/ROXICODONE) 5 MG immediate release tablet   Chronic low back pain (Location of Secondary source of pain) (Bilateral) (L>R) (Chronic)   Relevant Medications   oxyCODONE (OXY IR/ROXICODONE) 5 MG immediate release tablet   Chronic pain - Primary (Chronic)   Relevant Medications   oxyCODONE (OXY IR/ROXICODONE) 5 MG immediate release tablet   Chronic shoulder pain (Location of Tertiary source of pain) (Bilateral) (L>R) (Chronic)     Medium   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  Pharmacotherapy  (Medications Ordered): Meds ordered this encounter  Medications  . oxyCODONE (OXY IR/ROXICODONE) 5 MG immediate release tablet    Sig: Take 1 tablet (5 mg total) by mouth every 6 (six) hours as needed for severe pain.    Dispense:  120 tablet    Refill:  0    Do not place this medication, or any other prescription from our practice, on "Automatic Refill". Patient may have prescription filled one day early if pharmacy is closed on scheduled refill date. Do not fill until: 02/03/16 To last until: 03/04/16    [facility] & Procedure Ordered: Orders Placed This Encounter  Procedures  . ANA Comprehensive Panel  . Rheumatoid Arthritis Profile    Imaging Ordered: None  Interventional Therapies: Scheduled:  None at this time.    Considering:   Diagnostic bilateral intra-articular knee injection without fluoroscopy or IV sedation.  Possible series of 5 bilateral Hyalgan intra-articular knee injections without fluoroscopy or IV sedation.  Diagnostic bilateral genicular nerve blocks under fluoroscopic guidance, with or without sedation.  Possible bilateral genicular nerve radiofrequency ablation under fluoroscopic guidance and IV sedation the pending on the results of the diagnostic injections.  Diagnostic bilateral lumbar facet block under fluoroscopic guidance and IV sedation.  Possible bilateral lumbar facet radiofrequency ablation under fluoroscopic guidance and IV sedation the pending on the results of the diagnostic injection.  Diagnostic bilateral intra-articular shoulder joint injection under fluoroscopic guidance, with or without sedation.  Diagnostic bilateral suprascapular nerve block under fluoroscopic guidance, with or without sedation.  Possible bilateral suprascapular nerve radiofrequency ablation under fluoroscopic guidance and IV sedation.  Diagnostic right-sided L5-S1 transforaminal epidural steroid injection under fluoroscopic guidance, with or without sedation. Diagnostic  left sided S1 transforaminal epidural steroid injection under fluoroscopic guidance, with or without sedation.  Diagnostic right-sided L4-5 lumbar epidural steroid injection under fluoroscopic guidance, with or without sedation.    PRN Procedures:  None at this time.    Referral(s) or Consult(s): None at this time.  New Prescriptions   OXYCODONE (OXY IR/ROXICODONE) 5 MG IMMEDIATE RELEASE TABLET    Take 1 tablet (5 mg total) by mouth every 6 (six) hours as needed for severe pain.    Medications administered during this visit: Jennifer Rivers had no medications administered during this visit.  Requested PM Follow-up: Return in 4 weeks (on 02/28/2016) for Med-Mgmt.  Future Appointments Date Time Provider [place]  03/09/2016 9:00 AM Milinda Pointer, MD [facility] None    Primary Care Physician: Kirk Ruths., MD Location: [facility] Note by: Kathlen Brunswick. Dossie Arbour, M.D, DABA, DABAPM, DABPM, DABIPP, FIPP  Pain Score Disclaimer: We use the NRS-11 scale. This is a self-reported, subjective measurement of pain severity with only modest accuracy. It is used primarily to identify changes within a particular patient. It must be understood that outpatient pain scales are significantly less accurate that those used for research, where they can be applied under ideal controlled circumstances with minimal exposure to variables. In reality, the score is likely to be a combination of pain intensity and pain affect, where pain affect describes the degree of emotional arousal or changes in action readiness caused by the sensory experience of pain. Factors such as social and work situation, setting, emotional state, anxiety levels, expectation, and prior pain experience may influence pain perception and show large inter-individual differences that may also be affected by time variables.  Patient instructions provided during this appointment: Patient Instructions   Instructed to get labwork today at the medical  mall.

## 2016-02-04 LAB — ANA COMPREHENSIVE PANEL
Anti JO-1: <0.2 AI (ref 0.0–0.9)
Chromatin Ab SerPl-aCnc: <0.2 AI (ref 0.0–0.9)
SSB (La) (ENA) Antibody, IgG: <0.2 AI (ref 0.0–0.9)
Scleroderma (Scl-70) (ENA) Antibody, IgG: 0.2 AI (ref 0.0–0.9)
ds DNA Ab: <1 IU/mL (ref 0–9)

## 2016-02-04 LAB — RHEUMATOID ARTHRITIS PROFILE
CCP Antibodies IgG/IgA: 11 units (ref 0–19)
Rhuematoid fact SerPl-aCnc: 14.6 IU/mL — ABNORMAL HIGH (ref 0.0–13.9)

## 2016-02-11 ENCOUNTER — Encounter: Payer: Self-pay | Admitting: Pain Medicine

## 2016-02-17 NOTE — Progress Notes (Signed)
Normal levels of Rheumatoid Factor (RF) are between 0.0 - 13.9 IU/mL.  The RF test must be interpreted in conjunction with a person's symptoms and clinical history.  In those with symptoms and clinical signs of rheumatoid arthritis, the presence of significant concentrations of RF indicates that it is likely that they have RA. Higher levels of RF generally correlate with more severe disease and a poorer prognosis.  A negative RF test does not rule out Rheumatoid Arthritis. About 20% of people with RA will have very low levels of or no detectable RF levels. In these cases, a cyclic citrullinated peptide (CCP) antibody test may be positive and used to confirm RA.  Positive RF test results may also be seen in 1-5% of healthy people and in some people with conditions such as: Sjgren syndrome, scleroderma, systemic lupus erythematosus (lupus), sarcoidosis, endocarditis, tuberculosis, syphilis, HIV/AIDS, hepatitis, infectious mononucleosis, cancers such as leukemia and multiple myeloma, parasitic infection, or disease of the liver, lung, or kidney. The RF test is not used to diagnose or monitor these other conditions.

## 2016-03-09 ENCOUNTER — Encounter: Payer: Self-pay | Admitting: Pain Medicine

## 2016-03-09 ENCOUNTER — Ambulatory Visit: Payer: Medicare HMO | Attending: Pain Medicine | Admitting: Pain Medicine

## 2016-03-09 VITALS — BP 186/95 | HR 82 | Temp 98.1°F | Resp 14 | Ht 62.0 in | Wt 150.0 lb

## 2016-03-09 DIAGNOSIS — I10 Essential (primary) hypertension: Secondary | ICD-10-CM | POA: Insufficient documentation

## 2016-03-09 DIAGNOSIS — G894 Chronic pain syndrome: Secondary | ICD-10-CM | POA: Insufficient documentation

## 2016-03-09 DIAGNOSIS — M5410 Radiculopathy, site unspecified: Secondary | ICD-10-CM | POA: Insufficient documentation

## 2016-03-09 DIAGNOSIS — Z79891 Long term (current) use of opiate analgesic: Secondary | ICD-10-CM | POA: Insufficient documentation

## 2016-03-09 DIAGNOSIS — M170 Bilateral primary osteoarthritis of knee: Secondary | ICD-10-CM | POA: Insufficient documentation

## 2016-03-09 DIAGNOSIS — G8929 Other chronic pain: Secondary | ICD-10-CM

## 2016-03-09 DIAGNOSIS — J449 Chronic obstructive pulmonary disease, unspecified: Secondary | ICD-10-CM | POA: Insufficient documentation

## 2016-03-09 DIAGNOSIS — F17210 Nicotine dependence, cigarettes, uncomplicated: Secondary | ICD-10-CM | POA: Insufficient documentation

## 2016-03-09 DIAGNOSIS — M19012 Primary osteoarthritis, left shoulder: Secondary | ICD-10-CM | POA: Diagnosis not present

## 2016-03-09 DIAGNOSIS — M25512 Pain in left shoulder: Secondary | ICD-10-CM | POA: Insufficient documentation

## 2016-03-09 DIAGNOSIS — M79604 Pain in right leg: Secondary | ICD-10-CM

## 2016-03-09 DIAGNOSIS — Z885 Allergy status to narcotic agent status: Secondary | ICD-10-CM | POA: Diagnosis not present

## 2016-03-09 DIAGNOSIS — Z833 Family history of diabetes mellitus: Secondary | ICD-10-CM | POA: Diagnosis not present

## 2016-03-09 DIAGNOSIS — M5440 Lumbago with sciatica, unspecified side: Secondary | ICD-10-CM | POA: Diagnosis not present

## 2016-03-09 DIAGNOSIS — Z888 Allergy status to other drugs, medicaments and biological substances status: Secondary | ICD-10-CM | POA: Diagnosis not present

## 2016-03-09 DIAGNOSIS — M25511 Pain in right shoulder: Secondary | ICD-10-CM | POA: Diagnosis not present

## 2016-03-09 DIAGNOSIS — M25562 Pain in left knee: Secondary | ICD-10-CM

## 2016-03-09 DIAGNOSIS — Z8249 Family history of ischemic heart disease and other diseases of the circulatory system: Secondary | ICD-10-CM | POA: Insufficient documentation

## 2016-03-09 DIAGNOSIS — Z880 Allergy status to penicillin: Secondary | ICD-10-CM | POA: Diagnosis not present

## 2016-03-09 DIAGNOSIS — Z882 Allergy status to sulfonamides status: Secondary | ICD-10-CM | POA: Insufficient documentation

## 2016-03-09 DIAGNOSIS — M19011 Primary osteoarthritis, right shoulder: Secondary | ICD-10-CM | POA: Diagnosis not present

## 2016-03-09 DIAGNOSIS — K219 Gastro-esophageal reflux disease without esophagitis: Secondary | ICD-10-CM | POA: Diagnosis not present

## 2016-03-09 DIAGNOSIS — M25561 Pain in right knee: Secondary | ICD-10-CM | POA: Diagnosis present

## 2016-03-09 DIAGNOSIS — M818 Other osteoporosis without current pathological fracture: Secondary | ICD-10-CM | POA: Diagnosis not present

## 2016-03-09 DIAGNOSIS — M79605 Pain in left leg: Secondary | ICD-10-CM

## 2016-03-09 DIAGNOSIS — Z79899 Other long term (current) drug therapy: Secondary | ICD-10-CM | POA: Insufficient documentation

## 2016-03-09 MED ORDER — OXYCODONE HCL 5 MG PO TABS: 5.0000 mg | ORAL_TABLET | Freq: Four times a day (QID) | ORAL | 0 refills | Status: DC | PRN

## 2016-03-09 NOTE — Progress Notes (Signed)
Safety precautions to be maintained throughout the outpatient stay will include: orient to surroundings, keep bed in low position, maintain call bell within reach at all times, provide assistance with transfer out of bed and ambulation.  

## 2016-03-09 NOTE — Progress Notes (Addendum)
Patient's Name: Jennifer Rivers  MRN: [id number]  Referring Provider: Lauro RegulusAnderson, Marshall W, MD  DOB: [birth year]  PCP: Lauro RegulusANDERSON,MARSHALL W., MD  DOS: 03/09/2016  Note by: Sydnee LevansFrancisco A. Laban EmperorNaveira, MD  Service setting: Ambulatory outpatient  Specialty: Interventional Pain Management  Location: [facility] (AMB) Pain Management Facility    Patient type: Established   Primary Reason(s) for Visit: Encounter for prescription drug management (Level of risk: moderate) CC: Knee Pain (right) and Back Pain (upper, lower)  HPI  Ms. Jennifer Rivers is a 70 y.o. year old, female patient, who comes today for an initial evaluation. She has Adult idiopathic generalized osteoporosis; Atypical migraine; COPD (chronic obstructive pulmonary disease) (HCC); Generalized OA; BP (high blood pressure); Current tear of meniscus; Major depression in remission (HCC); Chronic knee pain (Location of Primary Source of Pain) (Bilateral) (R>L); Long term current use of opiate analgesic; Long term prescription opiate use; Opiate use (20 MME/Day); Encounter for pain management planning; Encounter for therapeutic drug level monitoring; Chronic low back pain (Location of Secondary source of pain) (Bilateral) (L>R); Chronic pain; Osteoarthritis of knee (Bilateral); Tricompartmental knee arthropathy (Bilateral); Chronic upper back pain (between shoulder blades) (Bilateral) (R>L); Chronic shoulder pain (Location of Tertiary source of pain) (Bilateral) (L>R); 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 Her primarily concern today is the Knee Pain (right) and Back Pain (upper, lower)  Pain Assessment: Self-Reported Pain Score: 6 /10 Clinically  the patient looks like a 2/10 Reported level is inconsistent with clinical observations. Information on the proper use of the pain score provided to the patient today. Pain Type: Chronic pain Pain Location: Knee Pain Orientation: Right Pain Descriptors / Indicators: Throbbing Pain Frequency: Constant  Ms. Jennifer Rivers was last seen on 02/03/2016 for medication management. During today's appointment we reviewed Ms. Skirvin's chronic pain status, as well as her outpatient medication regimen. Assessment details and plan are as follows.  The patient  reports that she does not use drugs. Her body mass index is 27.44 kg/m.  Controlled Substance Pharmacotherapy Assessment REMS (Risk Evaluation and Mitigation Strategy)  Analgesic:Hydrocodone/APAP 5/325 one tablet every 6 hours (20 mg/dayof hydrocodone) MME/day:20mg /day Pharmacokinetics: Onset of action (Liberation/Absorption): Within expected pharmacological parameters Time to Peak effect (Distribution): Timing and results are as within normal expected parameters Duration of action (Metabolism/Excretion): Within normal limits for medication Pharmacodynamics: Analgesic Effect: More than 50% Activity Facilitation: Medication(s) allow patient to sit, stand, walk, and do the basic ADLs Perceived Effectiveness: Described as relatively effective, allowing for increase in activities of daily living (ADL) Side-effects or Adverse reactions: None reported Monitoring:  PMP: Online review of the past 4351-month period conducted. Compliant with practice rules and regulations List of all UDS test(s) done:  Lab Results  Component Value Date   SUMMARY FINAL 12/28/2015   Last UDS on record: Summary  Date Value Ref Range Status  12/28/2015 FINAL  Final    Comment:    ==================================================================== TOXASSURE COMP DRUG ANALYSIS,UR ==================================================================== Test                              Result       Flag       Units Drug Present and Declared for Prescription Verification   Trazodone  PRESENT      EXPECTED   1,3 chlorophenyl piperazine    PRESENT      EXPECTED    1,3-chlorophenyl piperazine is an expected metabolite of    trazodone. Drug Present not Declared for Prescription Verification   Diphenhydramine                PRESENT      UNEXPECTED Drug Absent but Declared for Prescription Verification   Hydrocodone                    Not Detected UNEXPECTED ng/mg creat   Acetaminophen                  Not Detected UNEXPECTED    Acetaminophen, as indicated in the declared medication list, is    not always detected even when used as directed. ==================================================================== Test                      Result    Flag   Units      Ref Range   Creatinine              56               mg/dL      >=16 ==================================================================== Declared Medications:  The flagging and interpretation on this report are based on the  following declared medications.  Unexpected results may arise from  inaccuracies in the declared medications.  **Note: The testing scope of this panel includes these medications:  Hydrocodone (Hydrocodone-Acetaminophen)  Trazodone (Desyrel)  **Note: The testing scope of this panel does not include small to  moderate amounts of these reported medications:  Acetaminophen (Hydrocodone-Acetaminophen)  **Note: The testing scope of this panel does not include following  reported medications:  Albuterol  Carvedilol  Meloxicam (Mobic)  Omeprazole ==================================================================== For clinical consultation, please call [phone]. ====================================================================    UDS interpretation: Compliant          Medication Assessment Form: Reviewed. Patient indicates being compliant with  therapy Treatment compliance: Compliant Risk Assessment Profile: Aberrant/High Risk Behavior: None observed or detected today Risk Factors for Fatal Opioid Overdose: None new ones identified today Substance Use Disorder (SUD) Risk Level: Low Opioid Risk Tool (ORT) Total Score:    ORT Score Interpretation Table:  Score <3 = Low Risk for SUD  Score between 4-7 = Moderate Risk for SUD  Score >8 = High Risk for Opioid Abuse   Risk Mitigation Strategies:  Patient Counseling:  Covered Patient-Prescriber Agreement (PPA): Present and active  Notification to other healthcare providers: Done  Pharmacologic Plan: No change in therapy, at this time  Laboratory Chemistry  Inflammation Markers Lab Results  Component Value Date   ESRSEDRATE 55 (H) 12/28/2015   CRP 3.0 (H) 12/28/2015   Renal Function Lab Results  Component Value Date   BUN 18 12/28/2015   CREATININE 1.15 (H) 12/28/2015   GFRAA 56 (L) 12/28/2015   GFRNONAA 48 (L) 12/28/2015   Hepatic Function Lab Results  Component Value Date   AST 24 12/28/2015   ALT 17 12/28/2015   ALBUMIN 4.2 12/28/2015   Electrolytes Lab Results  Component Value Date   NA 134 (L) 12/28/2015   K 4.3 12/28/2015   CL 99 (L) 12/28/2015   CALCIUM 9.8 12/28/2015   MG 1.9 12/28/2015   Pain Modulating Vitamins Lab Results  Component Value Date   25OHVITD1 24 (L) 12/28/2015   25OHVITD2 2.5 12/28/2015  25OHVITD3 21 12/28/2015   VITAMINB12 176 (L) 12/28/2015   Coagulation Parameters Lab Results  Component Value Date   PLT 444 (H) 09/06/2015   Cardiovascular Lab Results  Component Value Date   HGB 9.5 (L) 09/06/2015   HCT 29.5 (L) 09/06/2015   Note: Lab results reviewed.  Recent Diagnostic Imaging Review  No results found.  Meds  The patient has a current medication list which includes the following prescription(s): albuterol, omeprazole, carvedilol, cholecalciferol, cyanocobalamin, oxycodone, and oxycodone.  Current Outpatient  Prescriptions on File Prior to Visit  Medication Sig  . albuterol (VENTOLIN HFA) 108 (90 BASE) MCG/ACT inhaler Take 2 puffs by mouth every 6 (six) hours as needed.  Marland Kitchen omeprazole (PRILOSEC) 20 MG capsule Take 20 mg by mouth daily.    No current facility-administered medications on file prior to visit.    ROS  Constitutional: Denies any fever or chills Gastrointestinal: No reported hemesis, hematochezia, vomiting, or acute GI distress Musculoskeletal: Denies any acute onset joint swelling, redness, loss of ROM, or weakness Neurological: No reported episodes of acute onset apraxia, aphasia, dysarthria, agnosia, amnesia, paralysis, loss of coordination, or loss of consciousness  Allergies  Ms. Azzarello is allergic to bupropion; codeine; gabapentin; tramadol; penicillins; and sulfa antibiotics.  PFSH  Drug: Ms. Coppa  reports that she does not use drugs. Alcohol:  reports that she does not drink alcohol. Tobacco:  reports that she has been smoking Cigarettes.  She has never used smokeless tobacco. Medical:  has a past medical history of Asthma; GERD (gastroesophageal reflux disease); Hypertension; MRSA (methicillin resistant staph aureus) culture positive (08/2015); Multilevel degenerative disc disease; Osteoarthritis; and Osteoporosis. Family: family history includes Diabetes in her mother; Heart attack in her father.  Past Surgical History:  Procedure Laterality Date  . ABDOMINAL HYSTERECTOMY    . KNEE ARTHROSCOPY Right 09/15/2015   Procedure: ARTHROSCOPY KNEE partial medial and lateral menisectomy, chondroplasty;  Surgeon: Erin Sons, MD;  Location: [facility];  Service: Orthopedics;  Laterality: Right;  . TONSILLECTOMY     Constitutional Exam  General appearance: Well nourished, well developed, and well hydrated. In no apparent acute distress Vitals:   03/09/16 0858  BP: (!) 186/95  Pulse: 82  Resp: 14  Temp: 98.1 F (36.7 C)  TempSrc: Oral  SpO2: 100%  Weight: 150 lb (68 kg)   Height: 5\' 2"  (1.575 m)   BMI Assessment: Estimated body mass index is 27.44 kg/m as calculated from the following:   Height as of this encounter: 5\' 2"  (1.575 m).   Weight as of this encounter: 150 lb (68 kg).  BMI interpretation table: BMI level Category Range association with higher incidence of chronic pain  <18 kg/m2 Underweight   18.5-24.9 kg/m2 Ideal body weight   25-29.9 kg/m2 Overweight Increased incidence by 20%  30-34.9 kg/m2 Obese (Class I) Increased incidence by 68%  35-39.9 kg/m2 Severe obesity (Class II) Increased incidence by 136%  >40 kg/m2 Extreme obesity (Class III) Increased incidence by 254%   BMI Readings from Last 4 Encounters:  04/06/16 27.44 kg/m  03/09/16 27.44 kg/m  02/03/16 29.08 kg/m  01/23/16 28.35 kg/m   Wt Readings from Last 4 Encounters:  04/06/16 150 lb (68 kg)  03/09/16 150 lb (68 kg)  02/03/16 159 lb (72.1 kg)  01/23/16 155 lb (70.3 kg)  Psych/Mental status: Alert, oriented x 3 (person, place, & time) Eyes: PERLA Respiratory: No evidence of acute respiratory distress  Cervical Spine Exam  Inspection: No masses, redness, or swelling Alignment: Symmetrical Functional ROM:  Unrestricted ROM Stability: No instability detected Muscle strength & Tone: Functionally intact Sensory: Unimpaired Palpation: Non-contributory  Upper Extremity (UE) Exam    Side: Right upper extremity  Side: Left upper extremity  Inspection: No masses, redness, swelling, or asymmetry  Inspection: No masses, redness, swelling, or asymmetry  Functional ROM: Unrestricted ROM         Functional ROM: Unrestricted ROM          Muscle strength & Tone: Functionally intact  Muscle strength & Tone: Functionally intact  Sensory: Unimpaired  Sensory: Unimpaired  Palpation: Non-contributory  Palpation: Non-contributory   Thoracic Spine Exam  Inspection: No masses, redness, or swelling Alignment: Symmetrical Functional ROM: Unrestricted ROM Stability: No instability  detected Sensory: Unimpaired Muscle strength & Tone: Functionally intact Palpation: Non-contributory  Lumbar Spine Exam  Inspection: No masses, redness, or swelling Alignment: Symmetrical Functional ROM: Decreased ROM Stability: No instability detected Muscle strength & Tone: Functionally intact Sensory: Movement-associated discomfort Palpation: Complains of area being tender to palpation Provocative Tests: Lumbar Hyperextension and rotation test: evaluation deferred today       Patrick's Maneuver: evaluation deferred today              Gait & Posture Assessment  Ambulation: Unassisted Gait: Relatively normal for age and body habitus Posture: WNL   Lower Extremity Exam    Side: Right lower extremity  Side: Left lower extremity  Inspection: No masses, redness, swelling, or asymmetry  Inspection: No masses, redness, swelling, or asymmetry  Functional ROM: Unrestricted ROM          Functional ROM: Unrestricted ROM          Muscle strength & Tone: Functionally intact  Muscle strength & Tone: Functionally intact  Sensory: Unimpaired  Sensory: Unimpaired  Palpation: Non-contributory  Palpation: Non-contributory   Assessment  Primary Diagnosis & Pertinent Problem List: The primary encounter diagnosis was Chronic low back pain with sciatica, sciatica laterality unspecified, unspecified back pain laterality. Diagnoses of Chronic pain of both lower extremities, Chronic lower extremity radicular pain (Bilateral) (R>L), Chronic knee pain (Location of Primary Source of Pain) (Bilateral) (R>L), Primary osteoarthritis of both knees, and Chronic pain syndrome were also pertinent to this visit.  Visit Diagnosis: 1. Chronic low back pain with sciatica, sciatica laterality unspecified, unspecified back pain laterality   2. Chronic pain of both lower extremities   3. Chronic lower extremity radicular pain (Bilateral) (R>L)   4. Chronic knee pain (Location of Primary Source of Pain) (Bilateral)  (R>L)   5. Primary osteoarthritis of both knees   6. Chronic pain syndrome    Plan of Care  Pharmacotherapy (Medications Ordered): Meds ordered this encounter  Medications  . oxyCODONE (OXY IR/ROXICODONE) 5 MG immediate release tablet    Sig: Take 1 tablet (5 mg total) by mouth every 6 (six) hours as needed for severe pain.    Dispense:  120 tablet    Refill:  0    Do not place this medication, or any other prescription from our practice, on "Automatic Refill". Patient may have prescription filled one day early if pharmacy is closed on scheduled refill date. Do not fill until: 03/09/16 To last until: 04/08/16   New Prescriptions   No medications on file   Medications administered during this visit: Ms. Eichhorn had no medications administered during this visit. Lab-work, Procedure(s), & Referral(s) Ordered: Orders Placed This Encounter  Procedures  . KNEE INJECTION  . MR LUMBAR SPINE WO CONTRAST   Imaging & Referral(s) Ordered: MR LUMBAR  SPINE WO CONTRAST  Interventional Therapies: Pending/Scheduled/Planned:   Diagnostic bilateral intra-articular knee injection, without fluoroscopy and IV sedation.   Considering:   Diagnostic bilateral intra-articular knee injection without fluoroscopy or IV sedation.  Possible series of 5 bilateral Hyalgan intra-articular knee injections without fluoroscopy or IV sedation.  Diagnostic bilateral genicular nerve blocks under fluoroscopic guidance, with or without sedation.  Possible bilateral genicular nerve radiofrequency ablation under fluoroscopic guidance and IV sedation the pending on the results of the diagnostic injections.  Diagnostic bilateral lumbar facet block under fluoroscopic guidance and IV sedation.  Possible bilateral lumbar facet radiofrequency ablation under fluoroscopic guidance and IV sedation the pending on the results of the diagnostic injection.  Diagnostic bilateral intra-articular shoulder joint injection under  fluoroscopic guidance, with or without sedation.  Diagnostic bilateral suprascapular nerve block under fluoroscopic guidance, with or without sedation.  Possible bilateral suprascapular nerve radiofrequency ablation under fluoroscopic guidance and IV sedation.  Diagnostic right-sided L5-S1 transforaminal epidural steroid injection under fluoroscopic guidance, with or without sedation. Diagnostic left sided S1 transforaminal epidural steroid injection under fluoroscopic guidance, with or without sedation.  Diagnostic right-sided L4-5 lumbar epidural steroid injection under fluoroscopic guidance, with or without sedation.    PRN Procedures:   None at this time    Requested PM Follow-up: Return in about 1 month (around 04/09/2016) for Med-Mgmt, In addition, Schedule Procedure, (ASAP).  Future Appointments Date Time Provider Department Center  04/17/2016 10:00 AM Delano Metz, MD [facility] None  04/18/2016 10:00 AM [facility]   Primary Care Physician: Lauro Regulus., MD Location: [facility] Note by: Sydnee Levans. Laban Emperor, M.D, DABA, DABAPM, DABPM, DABIPP, FIPP  Pain Score Disclaimer: We use the NRS-11 scale. This is a self-reported, subjective measurement of pain severity with only modest accuracy. It is used primarily to identify changes within a particular patient. It must be understood that outpatient pain scales are significantly less accurate that those used for research, where they can be applied under ideal controlled circumstances with minimal exposure to variables. In reality, the score is likely to be a combination of pain intensity and pain affect, where pain affect describes the degree of emotional arousal or changes in action readiness caused by the sensory experience of pain. Factors such as social and work situation, setting, emotional state, anxiety levels, expectation, and prior pain experience may influence pain perception and  show large inter-individual differences that may also be affected by time variables.  Patient instructions provided during this appointment: Patient Instructions   GENERAL RISKS AND COMPLICATIONS  What are the risk, side effects and possible complications? Generally speaking, most procedures are safe.  However, with any procedure there are risks, side effects, and the possibility of complications.  The risks and complications are dependent upon the sites that are lesioned, or the type of nerve block to be performed.  The closer the procedure is to the spine, the more serious the risks are.  Great care is taken when placing the radio frequency needles, block needles or lesioning probes, but sometimes complications can occur. 1. Infection: Any time there is an injection through the skin, there is a risk of infection.  This is why sterile conditions are used for these blocks.  There are four possible types of infection. 1. Localized skin infection. 2. Central Nervous System Infection-This can be in the form of Meningitis, which can be deadly. 3. Epidural Infections-This can be in the form of an epidural abscess, which can cause pressure inside of the  spine, causing compression of the spinal cord with subsequent paralysis. This would require an emergency surgery to decompress, and there are no guarantees that the patient would recover from the paralysis. 4. Discitis-This is an infection of the intervertebral discs.  It occurs in about 1% of discography procedures.  It is difficult to treat and it may lead to surgery.        2. Pain: the needles have to go through skin and soft tissues, will cause soreness.       3. Damage to internal structures:  The nerves to be lesioned may be near blood vessels or    other nerves which can be potentially damaged.       4. Bleeding: Bleeding is more common if the patient is taking blood thinners such as  aspirin, Coumadin, Ticiid, Plavix, etc., or if he/she have some  genetic predisposition  such as hemophilia. Bleeding into the spinal canal can cause compression of the spinal  cord with subsequent paralysis.  This would require an emergency surgery to  decompress and there are no guarantees that the patient would recover from the  paralysis.       5. Pneumothorax:  Puncturing of a lung is a possibility, every time a needle is introduced in  the area of the chest or upper back.  Pneumothorax refers to free air around the  collapsed lung(s), inside of the thoracic cavity (chest cavity).  Another two possible  complications related to a similar event would include: Hemothorax and Chylothorax.   These are variations of the Pneumothorax, where instead of air around the collapsed  lung(s), you may have blood or chyle, respectively.       6. Spinal headaches: They may occur with any procedures in the area of the spine.       7. Persistent CSF (Cerebro-Spinal Fluid) leakage: This is a rare problem, but may occur  with prolonged intrathecal or epidural catheters either due to the formation of a fistulous  track or a dural tear.       8. Nerve damage: By working so close to the spinal cord, there is always a possibility of  nerve damage, which could be as serious as a permanent spinal cord injury with  paralysis.       9. Death:  Although rare, severe deadly allergic reactions known as "Anaphylactic  reaction" can occur to any of the medications used.      10. Worsening of the symptoms:  We can always make thing worse.  What are the chances of something like this happening? Chances of any of this occuring are extremely low.  By statistics, you have more of a chance of getting killed in a motor vehicle accident: while driving to the hospital than any of the above occurring .  Nevertheless, you should be aware that they are possibilities.  In general, it is similar to taking a shower.  Everybody knows that you can slip, hit your head and get killed.  Does that mean that you should  not shower again?  Nevertheless always keep in mind that statistics do not mean anything if you happen to be on the wrong side of them.  Even if a procedure has a 1 (one) in a 1,000,000 (million) chance of going wrong, it you happen to be that one..Also, keep in mind that by statistics, you have more of a chance of having something go wrong when taking medications.  Who should not have this procedure? If you  are on a blood thinning medication (e.g. Coumadin, Plavix, see list of "Blood Thinners"), or if you have an active infection going on, you should not have the procedure.  If you are taking any blood thinners, please inform your physician.  How should I prepare for this procedure?  Do not eat or drink anything at least six hours prior to the procedure.  Bring a driver with you .  It cannot be a taxi.  Come accompanied by an adult that can drive you back, and that is strong enough to help you if your legs get weak or numb from the local anesthetic.  Take all of your medicines the morning of the procedure with just enough water to swallow them.  If you have diabetes, make sure that you are scheduled to have your procedure done first thing in the morning, whenever possible.  If you have diabetes, take only half of your insulin dose and notify our nurse that you have done so as soon as you arrive at the clinic.  If you are diabetic, but only take blood sugar pills (oral hypoglycemic), then do not take them on the morning of your procedure.  You may take them after you have had the procedure.  Do not take aspirin or any aspirin-containing medications, at least eleven (11) days prior to the procedure.  They may prolong bleeding.  Wear loose fitting clothing that may be easy to take off and that you would not mind if it got stained with Betadine or blood.  Do not wear any jewelry or perfume  Remove any nail coloring.  It will interfere with some of our monitoring equipment.  NOTE: Remember  that this is not meant to be interpreted as a complete list of all possible complications.  Unforeseen problems may occur.  BLOOD THINNERS The following drugs contain aspirin or other products, which can cause increased bleeding during surgery and should not be taken for 2 weeks prior to and 1 week after surgery.  If you should need take something for relief of minor pain, you may take acetaminophen which is found in Tylenol,m Datril, Anacin-3 and Panadol. It is not blood thinner. The products listed below are.  Do not take any of the products listed below in addition to any listed on your instruction sheet.  A.P.C or A.P.C with Codeine Codeine Phosphate Capsules #3 Ibuprofen Ridaura  ABC compound Congesprin Imuran rimadil  Advil Cope Indocin Robaxisal  Alka-Seltzer Effervescent Pain Reliever and Antacid Coricidin or Coricidin-D  Indomethacin Rufen  Alka-Seltzer plus [place] Cosprin Ketoprofen S-A-C Tablets  Anacin Analgesic Tablets or Capsules Coumadin Korlgesic Salflex  Anacin Extra Strength Analgesic tablets or capsules CP-2 Tablets Lanoril Salicylate  Anaprox Cuprimine Capsules Levenox Salocol  Anexsia-D Dalteparin Magan Salsalate  Anodynos Darvon compound Magnesium Salicylate Sine-off  Ansaid Dasin Capsules Magsal Sodium Salicylate  Anturane Depen Capsules Marnal Soma  APF Arthritis pain formula Dewitt's Pills Measurin Stanback  Argesic Dia-Gesic Meclofenamic Sulfinpyrazone  Arthritis Bayer Timed Release Aspirin Diclofenac Meclomen Sulindac  Arthritis pain formula Anacin Dicumarol Medipren Supac  Analgesic (Safety coated) Arthralgen Diffunasal Mefanamic Suprofen  Arthritis Strength Bufferin Dihydrocodeine Mepro Compound Suprol  Arthropan liquid Dopirydamole Methcarbomol with Aspirin Synalgos  ASA tablets/Enseals Disalcid Micrainin Tagament  Ascriptin Doan's Midol Talwin  Ascriptin A/D Dolene Mobidin Tanderil  Ascriptin Extra Strength Dolobid Moblgesic Ticlid  Ascriptin with  Codeine Doloprin or Doloprin with Codeine Momentum Tolectin  Asperbuf Duoprin Mono-gesic Trendar  Aspergum Duradyne Motrin or Motrin IB Triminicin  Aspirin plain,  buffered or enteric coated Durasal Myochrisine Trigesic  Aspirin Suppositories Easprin Nalfon Trillsate  Aspirin with Codeine Ecotrin Regular or Extra Strength Naprosyn Uracel  Atromid-S Efficin Naproxen Ursinus  Auranofin Capsules Elmiron Neocylate Vanquish  Axotal Emagrin Norgesic Verin  Azathioprine Empirin or Empirin with Codeine Normiflo Vitamin E  Azolid Emprazil Nuprin Voltaren  Bayer Aspirin plain, buffered or children's or timed BC Tablets or powders Encaprin Orgaran Warfarin Sodium  Buff-a-Comp Enoxaparin Orudis Zorpin  Buff-a-Comp with Codeine Equegesic Os-Cal-Gesic   Buffaprin Excedrin plain, buffered or Extra Strength Oxalid   Bufferin Arthritis Strength Feldene Oxphenbutazone   Bufferin plain or Extra Strength Feldene Capsules Oxycodone with Aspirin   Bufferin with Codeine Fenoprofen Fenoprofen Pabalate or Pabalate-SF   Buffets II Flogesic Panagesic   Buffinol plain or Extra Strength Florinal or Florinal with Codeine Panwarfarin   Buf-Tabs Flurbiprofen Penicillamine   Butalbital Compound Four-way cold tablets Penicillin   Butazolidin Fragmin Pepto-Bismol   Carbenicillin Geminisyn Percodan   Carna Arthritis Reliever Geopen Persantine   Carprofen Gold's salt Persistin   Chloramphenicol Goody's Phenylbutazone   Chloromycetin Haltrain Piroxlcam   Clmetidine heparin Plaquenil   Cllnoril Hyco-pap Ponstel   Clofibrate Hydroxy chloroquine Propoxyphen         Before stopping any of these medications, be sure to consult the physician who ordered them.  Some, such as Coumadin (Warfarin) are ordered to prevent or treat serious conditions such as "deep thrombosis", "pumonary embolisms", and other heart problems.  The amount of time that you may need off of the medication may also vary with the medication and the reason for  which you were taking it.  If you are taking any of these medications, please make sure you notify your pain physician before you undergo any procedures.

## 2016-03-09 NOTE — Patient Instructions (Signed)

## 2016-03-31 ENCOUNTER — Telehealth: Payer: Self-pay | Admitting: *Deleted

## 2016-04-03 ENCOUNTER — Ambulatory Visit: Payer: Medicare HMO

## 2016-04-06 ENCOUNTER — Encounter: Payer: Self-pay | Admitting: Pain Medicine

## 2016-04-06 ENCOUNTER — Ambulatory Visit: Payer: Medicare HMO | Attending: Pain Medicine | Admitting: Pain Medicine

## 2016-04-06 VITALS — BP 157/99 | HR 100 | Temp 97.7°F | Resp 16 | Ht 62.0 in | Wt 150.0 lb

## 2016-04-06 DIAGNOSIS — Z882 Allergy status to sulfonamides status: Secondary | ICD-10-CM | POA: Insufficient documentation

## 2016-04-06 DIAGNOSIS — Z79891 Long term (current) use of opiate analgesic: Secondary | ICD-10-CM | POA: Diagnosis not present

## 2016-04-06 DIAGNOSIS — Z885 Allergy status to narcotic agent status: Secondary | ICD-10-CM | POA: Diagnosis not present

## 2016-04-06 DIAGNOSIS — Z8614 Personal history of Methicillin resistant Staphylococcus aureus infection: Secondary | ICD-10-CM | POA: Insufficient documentation

## 2016-04-06 DIAGNOSIS — I10 Essential (primary) hypertension: Secondary | ICD-10-CM | POA: Insufficient documentation

## 2016-04-06 DIAGNOSIS — M545 Low back pain, unspecified: Secondary | ICD-10-CM

## 2016-04-06 DIAGNOSIS — Z79899 Other long term (current) drug therapy: Secondary | ICD-10-CM | POA: Insufficient documentation

## 2016-04-06 DIAGNOSIS — M25511 Pain in right shoulder: Secondary | ICD-10-CM

## 2016-04-06 DIAGNOSIS — M25562 Pain in left knee: Secondary | ICD-10-CM

## 2016-04-06 DIAGNOSIS — J449 Chronic obstructive pulmonary disease, unspecified: Secondary | ICD-10-CM | POA: Insufficient documentation

## 2016-04-06 DIAGNOSIS — M818 Other osteoporosis without current pathological fracture: Secondary | ICD-10-CM | POA: Insufficient documentation

## 2016-04-06 DIAGNOSIS — M25561 Pain in right knee: Secondary | ICD-10-CM | POA: Diagnosis not present

## 2016-04-06 DIAGNOSIS — M19012 Primary osteoarthritis, left shoulder: Secondary | ICD-10-CM | POA: Insufficient documentation

## 2016-04-06 DIAGNOSIS — M19011 Primary osteoarthritis, right shoulder: Secondary | ICD-10-CM | POA: Diagnosis not present

## 2016-04-06 DIAGNOSIS — G8929 Other chronic pain: Secondary | ICD-10-CM

## 2016-04-06 DIAGNOSIS — G894 Chronic pain syndrome: Secondary | ICD-10-CM | POA: Diagnosis not present

## 2016-04-06 DIAGNOSIS — K219 Gastro-esophageal reflux disease without esophagitis: Secondary | ICD-10-CM | POA: Insufficient documentation

## 2016-04-06 DIAGNOSIS — M170 Bilateral primary osteoarthritis of knee: Secondary | ICD-10-CM | POA: Diagnosis not present

## 2016-04-06 DIAGNOSIS — F17210 Nicotine dependence, cigarettes, uncomplicated: Secondary | ICD-10-CM | POA: Diagnosis not present

## 2016-04-06 DIAGNOSIS — F1190 Opioid use, unspecified, uncomplicated: Secondary | ICD-10-CM

## 2016-04-06 DIAGNOSIS — Z5181 Encounter for therapeutic drug level monitoring: Secondary | ICD-10-CM | POA: Diagnosis not present

## 2016-04-06 DIAGNOSIS — Z833 Family history of diabetes mellitus: Secondary | ICD-10-CM | POA: Insufficient documentation

## 2016-04-06 DIAGNOSIS — M25512 Pain in left shoulder: Secondary | ICD-10-CM

## 2016-04-06 DIAGNOSIS — Z880 Allergy status to penicillin: Secondary | ICD-10-CM | POA: Insufficient documentation

## 2016-04-06 DIAGNOSIS — Z888 Allergy status to other drugs, medicaments and biological substances status: Secondary | ICD-10-CM | POA: Diagnosis not present

## 2016-04-06 DIAGNOSIS — M79604 Pain in right leg: Secondary | ICD-10-CM

## 2016-04-06 DIAGNOSIS — Z8249 Family history of ischemic heart disease and other diseases of the circulatory system: Secondary | ICD-10-CM | POA: Insufficient documentation

## 2016-04-06 DIAGNOSIS — M79605 Pain in left leg: Secondary | ICD-10-CM

## 2016-04-06 MED ORDER — OXYCODONE HCL 5 MG PO TABS: 5.0000 mg | ORAL_TABLET | Freq: Four times a day (QID) | ORAL | 0 refills | Status: DC | PRN

## 2016-04-06 NOTE — Progress Notes (Signed)
Patient's Name: Jennifer Rivers  MRN: [id number]  Referring Provider: Kirk Ruths, MD  DOB: 07-29-1945  PCP: Kirk Ruths, MD  DOS: 04/06/2016  Note by: Kathlen Brunswick. Dossie Arbour, MD  Service setting: Ambulatory outpatient  Specialty: Interventional Pain Management  Location: [facility] (AMB) Pain Management Facility    Patient type: Established   Primary Reason(s) for Visit: Encounter for prescription drug management (Level of risk: moderate) CC: Knee Pain (right); Back Pain (lower); and Shoulder Pain (left)  HPI  Jennifer Rivers is a 70 y.o. year old, female patient, who comes today for a medication management evaluation. She has Adult idiopathic generalized osteoporosis; Atypical migraine; COPD (chronic obstructive pulmonary disease) ([place]); Generalized OA; BP (high blood pressure); Current tear of meniscus; Major depression in remission ([place]); Chronic knee pain (Location of Primary Source of Pain) (Bilateral) (R>L); Long term current use of opiate analgesic; Long term prescription opiate use; Opiate use (20 MME/Day); Encounter for pain management planning; Encounter for therapeutic drug level monitoring; Chronic low back pain (Location of Secondary source of pain) (Bilateral) (L>R); Tricompartmental knee arthropathy (Bilateral); Chronic upper back pain (between shoulder blades) (Bilateral) (R>L); Chronic shoulder pain (Location of Tertiary source of pain) (Bilateral) (L>R); 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 Her primarily concern today is the Knee Pain (right); Back Pain (lower); and Shoulder Pain (left)  Pain  Assessment: Self-Reported Pain Score: 6 /10 Clinically the patient looks like a 2/10 Reported level is inconsistent with clinical observations. Information on the proper use of the pain score provided to the patient today. Pain Type: Chronic pain Pain Location: Knee Pain Orientation: Right Pain Descriptors / Indicators: Aching Pain Frequency: Intermittent  Jennifer Rivers was last seen on 03/09/2016 for medication management. During today's appointment we reviewed Jennifer Rivers's chronic pain status, as well as her outpatient medication regimen.  The patient  reports that she does not use drugs. Her body mass index is 27.44 kg/m.  Further details on both, my assessment(s), as well as the proposed treatment plan, please see below.  Controlled Substance Pharmacotherapy Assessment REMS (Risk Evaluation and Mitigation Strategy)  Analgesic: Oxycodone IR 5 mg 1 tablet by mouth every 6 hours (20 mg/day of oxycodone) MME/day: 30 mg/day.  [name], RN  04/06/2016  1:40 PM  Sign at close encounter Nursing Pain Medication Assessment:  Safety precautions to be maintained throughout the outpatient stay will include: orient to surroundings, keep bed in low position, maintain call bell within reach at all times, provide assistance with transfer out of bed and ambulation.  Medication Inspection Compliance: Pill count conducted under aseptic conditions, in front of the patient. Neither the pills nor the bottle was removed from the patient's sight at any time. Once count was completed pills were immediately returned to the patient in their original bottle.  Medication: Oxycodone IR Pill Count: 9 of 120 pills remain Bottle Appearance: Standard pharmacy container. Clearly labeled. Filled Date: 48 / 19 / 2017 Medication last intake: 04-06-16 at 1000   Pharmacokinetics: Liberation and absorption (onset of action): WNL Distribution (time to peak effect): WNL Metabolism and excretion (duration of action):  WNL         Pharmacodynamics: Desired effects: Analgesia: Jennifer Rivers reports >50% benefit. Functional ability:  Patient reports that medication allows her to accomplish basic ADLs Clinically meaningful improvement in function (CMIF): Sustained CMIF goals met Perceived effectiveness: Described as relatively effective, allowing for increase in activities of daily living (ADL) Undesirable effects: Side-effects or Adverse reactions: None reported Monitoring: [place] PMP: Online review of the past 12-monthperiod conducted. Compliant with practice rules and regulations List of all UDS test(s) done:  Lab Results  Component Value Date   SUMMARY FINAL 12/28/2015   Last UDS on record: No results found for: TOXASSSELUR UDS interpretation: Compliant          Medication Assessment Form: Reviewed. Patient indicates being compliant with therapy Treatment compliance: Compliant Risk Assessment Profile: Aberrant behavior: See prior evaluations. None observed or detected today Comorbid factors increasing risk of overdose: See prior notes. No additional risks detected today Risk of substance use disorder (SUD): Low Opioid Risk Tool (ORT) Total Score: 0  Interpretation Table:  Score <3 = Low Risk for SUD  Score between 4-7 = Moderate Risk for SUD  Score >8 = High Risk for Opioid Abuse   Risk Mitigation Strategies:  Patient Counseling: Covered Patient-Prescriber Agreement (PPA): Present and active  Notification to other healthcare providers: Done  Pharmacologic Plan: No change in therapy, at this time  Laboratory Chemistry  Inflammation Markers Lab Results  Component Value Date   ESRSEDRATE 55 (H) 12/28/2015   CRP 3.0 (H) 12/28/2015   Renal Function Lab Results  Component Value Date   BUN 18 12/28/2015   CREATININE 1.15 (H) 12/28/2015   GFRAA 56 (L) 12/28/2015   GFRNONAA 48 (L) 12/28/2015   Hepatic Function Lab Results  Component Value Date   AST 24 12/28/2015   ALT 17 12/28/2015    ALBUMIN 4.2 12/28/2015   Electrolytes Lab Results  Component Value Date   NA 134 (L) 12/28/2015   K 4.3 12/28/2015   CL 99 (L) 12/28/2015   CALCIUM 9.8 12/28/2015   MG 1.9 12/28/2015   Pain Modulating Vitamins Lab Results  Component Value Date   25OHVITD1 24 (L) 12/28/2015   25OHVITD2 2.5 12/28/2015   25OHVITD3 21 12/28/2015   VITAMINB12 176 (L) 12/28/2015   Coagulation Parameters Lab Results  Component Value Date   PLT 444 (H) 09/06/2015   Cardiovascular Lab Results  Component Value Date   HGB 9.5 (L) 09/06/2015   HCT 29.5 (L) 09/06/2015   Note: Lab results reviewed.  Recent Diagnostic Imaging Review  No results found. Note: Imaging results reviewed.  Meds  The patient has a current medication list which includes the following prescription(s): albuterol, carvedilol, cholecalciferol, cyanocobalamin, omeprazole, and oxycodone.  Current Outpatient Prescriptions on File Prior to Visit  Medication Sig  . albuterol (VENTOLIN HFA) 108 (90 BASE) MCG/ACT inhaler Take 2 puffs by mouth every 6 (six) hours as needed.  .Marland Kitchenomeprazole (PRILOSEC) 20 MG capsule Take 20 mg by mouth daily.    No current facility-administered medications on file prior to visit.    ROS  Constitutional: Denies any fever or chills Gastrointestinal: No reported hemesis, hematochezia, vomiting, or acute GI distress Musculoskeletal: Denies any acute onset joint swelling, redness, loss of ROM, or weakness Neurological: No reported episodes of acute onset apraxia, aphasia, dysarthria, agnosia, amnesia, paralysis, loss of coordination, or loss of consciousness  Allergies  Jennifer Rivers allergic to bupropion; codeine; gabapentin; tramadol; penicillins; and sulfa antibiotics.  [place] Drug: Ms. RJoubert reports that she does not use drugs. Alcohol:  reports that she does not drink alcohol. Tobacco:  reports  that she has been smoking Cigarettes.  She has never used smokeless tobacco. Medical:  has a past  medical history of Asthma; GERD (gastroesophageal reflux disease); Hypertension; MRSA (methicillin resistant staph aureus) culture positive (08/2015); Multilevel degenerative disc disease; Osteoarthritis; and Osteoporosis. Family: family history includes Diabetes in her mother; Heart attack in her father.  Past Surgical History:  Procedure Laterality Date  . ABDOMINAL HYSTERECTOMY    . KNEE ARTHROSCOPY Right 09/15/2015   Procedure: ARTHROSCOPY KNEE partial medial and lateral menisectomy, chondroplasty;  Surgeon: Leanor Kail, MD;  Location: [facility] ORS;  Service: Orthopedics;  Laterality: Right;  . TONSILLECTOMY     Constitutional Exam  General appearance: Well nourished, well developed, and well hydrated. In no apparent acute distress Vitals:   04/06/16 1332  BP: (!) 157/99  Pulse: 100  Resp: 16  Temp: 97.7 F (36.5 C)  TempSrc: Oral  SpO2: (!) 83%  Weight: 150 lb (68 kg)  Height: 5' 2"  (1.575 m)   BMI Assessment: Estimated body mass index is 27.44 kg/m as calculated from the following:   Height as of this encounter: 5' 2"  (1.575 m).   Weight as of this encounter: 150 lb (68 kg).  BMI interpretation table: BMI level Category Range association with higher incidence of chronic pain  <18 kg/m2 Underweight   18.5-24.9 kg/m2 Ideal body weight   25-29.9 kg/m2 Overweight Increased incidence by 20%  30-34.9 kg/m2 Obese (Class I) Increased incidence by 68%  35-39.9 kg/m2 Severe obesity (Class II) Increased incidence by 136%  >40 kg/m2 Extreme obesity (Class III) Increased incidence by 254%   BMI Readings from Last 4 Encounters:  04/06/16 27.44 kg/m  03/09/16 27.44 kg/m  02/03/16 29.08 kg/m  01/23/16 28.35 kg/m   Wt Readings from Last 4 Encounters:  04/06/16 150 lb (68 kg)  03/09/16 150 lb (68 kg)  02/03/16 159 lb (72.1 kg)  01/23/16 155 lb (70.3 kg)  Psych/Mental status: Alert, oriented x 3 (person, place, & time) Eyes: PERLA Respiratory: No evidence of acute respiratory  distress  Cervical Spine Exam  Inspection: No masses, redness, or swelling Alignment: Symmetrical Functional ROM: Unrestricted ROM Stability: No instability detected Muscle strength & Tone: Functionally intact Sensory: Unimpaired Palpation: Non-contributory  Upper Extremity (UE) Exam    Side: Right upper extremity  Side: Left upper extremity  Inspection: No masses, redness, swelling, or asymmetry  Inspection: No masses, redness, swelling, or asymmetry  Functional ROM: Unrestricted ROM          Functional ROM: Unrestricted ROM          Muscle strength & Tone: Functionally intact  Muscle strength & Tone: Functionally intact  Sensory: Unimpaired  Sensory: Unimpaired  Palpation: Non-contributory  Palpation: Non-contributory   Thoracic Spine Exam  Inspection: No masses, redness, or swelling Alignment: Symmetrical Functional ROM: Unrestricted ROM Stability: No instability detected Sensory: Unimpaired Muscle strength & Tone: Functionally intact Palpation: Non-contributory  Lumbar Spine Exam  Inspection: No masses, redness, or swelling Alignment: Symmetrical Functional ROM: Unrestricted ROM Stability: No instability detected Muscle strength & Tone: Functionally intact Sensory: Unimpaired Palpation: Non-contributory Provocative Tests: Lumbar Hyperextension and rotation test: evaluation deferred today       Patrick's Maneuver: evaluation deferred today              Gait & Posture Assessment  Ambulation: Unassisted Gait: Relatively normal for age and body habitus Posture: WNL   Lower Extremity Exam    Side: Right lower extremity  Side: Left lower extremity  Inspection: No masses, redness,  swelling, or asymmetry  Inspection: No masses, redness, swelling, or asymmetry  Functional ROM: Unrestricted ROM          Functional ROM: Unrestricted ROM          Muscle strength & Tone: Functionally intact  Muscle strength & Tone: Functionally intact  Sensory: Unimpaired  Sensory: Unimpaired   Palpation: Non-contributory  Palpation: Non-contributory   Assessment  Primary Diagnosis & Pertinent Problem List: The primary encounter diagnosis was Chronic pain syndrome. Diagnoses of Long term current use of opiate analgesic, Opiate use (20 MME/Day), Chronic knee pain (Location of Primary Source of Pain) (Bilateral) (R>L), Chronic low back pain (Location of Secondary source of pain) (Bilateral) (L>R), Chronic lower extremity pain (Bilateral) (R>L), Chronic shoulder pain (Location of Tertiary source of pain) (Bilateral) (L>R), and Osteoarthritis of knees (Bilateral) were also pertinent to this visit.  Visit Diagnosis: 1. Chronic pain syndrome   2. Long term current use of opiate analgesic   3. Opiate use (20 MME/Day)   4. Chronic knee pain (Location of Primary Source of Pain) (Bilateral) (R>L)   5. Chronic low back pain (Location of Secondary source of pain) (Bilateral) (L>R)   6. Chronic lower extremity pain (Bilateral) (R>L)   7. Chronic shoulder pain (Location of Tertiary source of pain) (Bilateral) (L>R)   8. Osteoarthritis of knees (Bilateral)    Plan of Care  Pharmacotherapy (Medications Ordered): Meds ordered this encounter  Medications  . oxyCODONE (OXY IR/ROXICODONE) 5 MG immediate release tablet    Sig: Take 1 tablet (5 mg total) by mouth every 6 (six) hours as needed for severe pain.    Dispense:  120 tablet    Refill:  0    Do not place this medication, or any other prescription from our practice, on "Automatic Refill". Patient may have prescription filled one day early if pharmacy is closed on scheduled refill date. Do not fill until: 04/08/16 To last until: 05/08/16   New Prescriptions   No medications on file   Medications administered today: Jennifer Rivers had no medications administered during this visit. Lab-work, procedure(s), and/or referral(s): Orders Placed This Encounter  Procedures  . KNEE INJECTION  . ToxASSURE Select 13 (MW), Urine   Imaging and/or  referral(s): None  Interventional therapies: Planned, scheduled, and/or pending:   Diagnostic bilateral intra-articular knee injection, without fluoroscopy and IV sedation.   Considering:   Diagnostic bilateral intra-articular knee injection without fluoroscopy or IV sedation.  Possible series of 5 bilateral Hyalgan intra-articular knee injections without fluoroscopy or IV sedation.  Diagnostic bilateral genicular nerve blocks under fluoroscopic guidance, with or without sedation.  Possible bilateral genicular nerve radiofrequency ablation under fluoroscopic guidance and IV sedation the pending on the results of the diagnostic injections.  Diagnostic bilateral lumbar facet block under fluoroscopic guidance and IV sedation.  Possible bilateral lumbar facet radiofrequency ablation under fluoroscopic guidance and IV sedation the pending on the results of the diagnostic injection.  Diagnostic bilateral intra-articular shoulder joint injection under fluoroscopic guidance, with or without sedation.  Diagnostic bilateral suprascapular nerve block under fluoroscopic guidance, with or without sedation.  Possible bilateral suprascapular nerve radiofrequency ablation under fluoroscopic guidance and IV sedation.  Diagnostic right-sided L5-S1 transforaminal epidural steroid injection under fluoroscopic guidance, with or without sedation. Diagnostic left sided S1 transforaminal epidural steroid injection under fluoroscopic guidance, with or without sedation.  Diagnostic right-sided L4-5 lumbar epidural steroid injection under fluoroscopic guidance, with or without sedation.    Palliative PRN treatment(s):   Not at this time.  Provider-requested follow-up: Return in about 1 month (around 05/06/2016) for Med-Mgmt.  Future Appointments Date Time Provider [place]  04/17/2016 10:00 AM Milinda Pointer, MD [facility] None  04/18/2016 10:00 AM [facility]  05/04/2016 2:00 PM Milinda Pointer, MD [facility] None   Primary Care Physician: Kirk Ruths, MD Location: [facility] Outpatient Pain Management Facility Note by: Kathlen Brunswick. Dossie Arbour, M.D, DABA, DABAPM, DABPM, DABIPP, FIPP  Pain Score Disclaimer: We use the NRS-11 scale. This is a self-reported, subjective measurement of pain severity with only modest accuracy. It is used primarily to identify changes within a particular patient. It must be understood that outpatient pain scales are significantly less accurate that those used for research, where they can be applied under ideal controlled circumstances with minimal exposure to variables. In reality, the score is likely to be a combination of pain intensity and pain affect, where pain affect describes the degree of emotional arousal or changes in action readiness caused by the sensory experience of pain. Factors such as social and work situation, setting, emotional state, anxiety levels, expectation, and prior pain experience may influence pain perception and show large inter-individual differences that may also be affected by time variables.  Patient instructions provided during this appointment: There are no Patient Instructions on file for this visit.

## 2016-04-06 NOTE — Progress Notes (Signed)
Nursing Pain Medication Assessment:  Safety precautions to be maintained throughout the outpatient stay will include: orient to surroundings, keep bed in low position, maintain call bell within reach at all times, provide assistance with transfer out of bed and ambulation.  Medication Inspection Compliance: Pill count conducted under aseptic conditions, in front of the patient. Neither the pills nor the bottle was removed from the patient's sight at any time. Once count was completed pills were immediately returned to the patient in their original bottle.  Medication: Oxycodone IR Pill Count: 9 of 120 pills remain Bottle Appearance: Standard pharmacy container. Clearly labeled. Filled Date: 10 / 19 / 2017 Medication last intake: 04-06-16 at 1000

## 2016-04-15 LAB — TOXASSURE SELECT 13 (MW), URINE

## 2016-04-17 ENCOUNTER — Encounter: Payer: Self-pay | Admitting: Pain Medicine

## 2016-04-17 ENCOUNTER — Ambulatory Visit: Payer: Medicare HMO | Attending: Pain Medicine | Admitting: Pain Medicine

## 2016-04-17 VITALS — BP 187/94 | HR 86 | Temp 98.7°F | Resp 16 | Ht 62.0 in | Wt 150.0 lb

## 2016-04-17 DIAGNOSIS — M170 Bilateral primary osteoarthritis of knee: Secondary | ICD-10-CM

## 2016-04-17 DIAGNOSIS — Z885 Allergy status to narcotic agent status: Secondary | ICD-10-CM | POA: Diagnosis not present

## 2016-04-17 DIAGNOSIS — Z882 Allergy status to sulfonamides status: Secondary | ICD-10-CM | POA: Insufficient documentation

## 2016-04-17 DIAGNOSIS — Z880 Allergy status to penicillin: Secondary | ICD-10-CM | POA: Diagnosis not present

## 2016-04-17 DIAGNOSIS — M25562 Pain in left knee: Secondary | ICD-10-CM

## 2016-04-17 DIAGNOSIS — M25561 Pain in right knee: Secondary | ICD-10-CM

## 2016-04-17 DIAGNOSIS — Z888 Allergy status to other drugs, medicaments and biological substances status: Secondary | ICD-10-CM | POA: Diagnosis not present

## 2016-04-17 DIAGNOSIS — G8929 Other chronic pain: Secondary | ICD-10-CM | POA: Diagnosis not present

## 2016-04-17 MED ORDER — METHYLPREDNISOLONE ACETATE 40 MG/ML IJ SUSP
40.0000 mg | Freq: Once | INTRAMUSCULAR | Status: AC
  Administered 2016-04-17: 40 mg
  Filled 2016-04-17: qty 1

## 2016-04-17 MED ORDER — ROPIVACAINE HCL 2 MG/ML IJ SOLN
4.0000 mL | Freq: Once | INTRAMUSCULAR | Status: AC
  Administered 2016-04-17: 4 mL

## 2016-04-17 MED ORDER — ROPIVACAINE HCL 2 MG/ML IJ SOLN
4.0000 mL | Freq: Once | INTRAMUSCULAR | Status: AC
  Administered 2016-04-17: 4 mL
  Filled 2016-04-17: qty 10

## 2016-04-17 MED ORDER — LIDOCAINE HCL (PF) 1 % IJ SOLN
10.0000 mL | Freq: Once | INTRAMUSCULAR | Status: AC
  Administered 2016-04-17: 10 mL
  Filled 2016-04-17: qty 10

## 2016-04-17 NOTE — Progress Notes (Signed)
Patient's Name: Jennifer Rivers  MRN: [id number]  Referring Provider: Lauro RegulusAnderson, Marshall W, MD  DOB: 31-Jul-1945  PCP: Lauro RegulusMarshall W Anderson, MD  DOS: 04/17/2016  Note by: Sydnee LevansFrancisco A. Laban EmperorNaveira, MD  Service setting: Ambulatory outpatient  Location: [facility] (AMB) Pain Management Facility  Visit type: Procedure  Specialty: Interventional Pain Management  Patient type: Established   Primary Reason for Visit: Interventional Pain Management Treatment. CC: Knee Pain (both knees)  Procedure:  Anesthesia, Analgesia, Anxiolysis:  Type: Diagnostic Intra-Articular Local anesthetic and steroid Knee Injection Region: Lateral infrapatellar Knee Region Level: Knee Joint Laterality: Bilateral  Type: Local Anesthesia Local Anesthetic: Lidocaine 1% Route: Infiltration ([place]/IM) IV Access: Declined Sedation: Declined  Indication(s): Analgesia          Indications: 1. Osteoarthritis of knees (Bilateral)   2. Chronic knee pain (Location of Primary Source of Pain) (Bilateral) (R>L)    Pain Score: Pre-procedure: 5 /10 Post-procedure: 0-No pain/10  Pre-Procedure Assessment:  Jennifer Rivers is a 70 y.o. (year old), female patient, seen today for interventional treatment. She  has a past surgical history that includes Abdominal hysterectomy; Tonsillectomy; and Knee arthroscopy (Right, 09/15/2015).. Her primarily concern today is the Knee Pain (both knees) The primary encounter diagnosis was Osteoarthritis of knees (Bilateral). A diagnosis of Chronic knee pain (Location of Primary Source of Pain) (Bilateral) (R>L) was also pertinent to this visit.  Pain Type: Chronic pain Pain Location: Knee Pain Orientation: Left, Right Pain Descriptors / Indicators: Aching, Constant, Throbbing (tightness, some swelling to ankle and knee) Pain Frequency: Constant  Date of Last Visit: 04/06/16 Service Provided on Last Visit: Med Refill  Coagulation Parameters Lab Results  Component Value Date   PLT 444 (H) 09/06/2015   Verification  of the correct person, correct site (including marking of site), and correct procedure were performed and confirmed by the patient.  Consent: Before the procedure and under the influence of no sedative(s), amnesic(s), or anxiolytics, the patient was informed of the treatment options, risks and possible complications. To fulfill our ethical and legal obligations, as recommended by the American Medical Association's Code of Ethics, I have informed the patient of my clinical impression; the nature and purpose of the treatment or procedure; the risks, benefits, and possible complications of the intervention; the alternatives, including doing nothing; the risk(s) and benefit(s) of the alternative treatment(s) or procedure(s); and the risk(s) and benefit(s) of doing nothing. The patient was provided information about the general risks and possible complications associated with the procedure. These may include, but are not limited to: failure to achieve desired goals, infection, bleeding, organ or nerve damage, allergic reactions, paralysis, and death. In addition, the patient was informed of those risks and complications associated to the procedure, such as failure to decrease pain; infection; bleeding; organ or nerve damage with subsequent damage to sensory, motor, and/or autonomic systems, resulting in permanent pain, numbness, and/or weakness of one or several areas of the body; allergic reactions; (i.e.: anaphylactic reaction); and/or death. Furthermore, the patient was informed of those risks and complications associated with the medications. These include, but are not limited to: allergic reactions (i.e.: anaphylactic or anaphylactoid reaction(s)); adrenal axis suppression; blood sugar elevation that in diabetics may result in ketoacidosis or comma; water retention that in patients with history of congestive heart failure may result in shortness of breath, pulmonary edema, and decompensation with resultant  heart failure; weight gain; swelling or edema; medication-induced neural toxicity; particulate matter embolism and blood vessel occlusion with resultant organ, and/or nervous system infarction; and/or aseptic necrosis  of one or more joints. Finally, the patient was informed that Medicine is not an exact science; therefore, there is also the possibility of unforeseen or unpredictable risks and/or possible complications that may result in a catastrophic outcome. The patient indicated having understood very clearly. We have given the patient no guarantees and we have made no promises. Enough time was given to the patient to ask questions, all of which were answered to the patient's satisfaction. Jennifer Rivers has indicated that she wanted to continue with the procedure.  Consent Attestation: I, the ordering provider, attest that I have discussed with the patient the benefits, risks, side-effects, alternatives, likelihood of achieving goals, and potential problems during recovery for the procedure that I have provided informed consent.  Pre-Procedure Preparation:  Safety Precautions: Allergies reviewed. The patient was asked about blood thinners, or active infections, both of which were denied. The patient was asked to confirm the procedure and laterality, before marking the site, and again before commencing the procedure. Appropriate site, procedure, and patient were confirmed by following the Joint Commission's Universal Protocol (UP.01.01.01), in the form of a "Time Out". The patient was asked to participate by confirming the accuracy of the "Time Out" information. Patient was assessed for positional comfort and pressure points before starting the procedure. Allergies: She is allergic to bupropion; codeine; gabapentin; tramadol; penicillins; and sulfa antibiotics. Allergy Precautions: None required Infection Control Precautions: Sterile technique used. Standard Universal Precautions were taken as recommended  by the Department of [facility] for Disease Control and Prevention (CDC). Standard pre-surgical skin prep was conducted. Respiratory hygiene and cough etiquette was practiced. Hand hygiene observed. Safe injection practices and needle disposal techniques followed. SDV (single dose vial) medications used. Medications properly checked for expiration dates and contaminants. Personal protective equipment (PPE) used as per protocol. Monitoring:  As per clinic protocol. Vitals:   04/17/16 1057 04/17/16 1112 04/17/16 1116 04/17/16 1118  BP: (!) 165/91 (!) 180/94 (!) 170/100 (!) 187/94  Pulse: 95 96 97 86  Resp: 16 16 18 16   Temp:      TempSrc:      SpO2: 100% 100% 100% 100%  Weight:      Height:      Calculated BMI: Body mass index is 27.44 kg/m. Time-out: "Time-out" completed before starting procedure, as per protocol.  Description of Procedure Process:   Time-out: "Time-out" completed before starting procedure, as per protocol. Position: Sitting Target Area: Knee Joint Approach: Just above the Lateral tibial plateau, lateral to the infrapatellar tendon. Area Prepped: Entire knee area, from the mid-thigh to the mid-shin. Prepping solution: ChloraPrep (2% chlorhexidine gluconate and 70% isopropyl alcohol) Safety Precautions: Aspiration looking for blood return was conducted prior to all injections. At no point did we inject any substances, as a needle was being advanced. No attempts were made at seeking any paresthesias. Safe injection practices and needle disposal techniques used. Medications properly checked for expiration dates. SDV (single dose vial) medications used. Description of the Procedure: Protocol guidelines were followed. The patient was placed in position over the fluoroscopy table. The target area was identified and the area prepped in the usual manner. Skin desensitized using vapocoolant spray. Skin & deeper tissues infiltrated with local anesthetic. Appropriate amount of  time allowed to pass for local anesthetics to take effect. The procedure needles were then advanced to the target area. Proper needle placement secured. Negative aspiration confirmed. Solution injected in intermittent fashion, asking for systemic symptoms every 0.5cc of injectate. The needles were then removed  and the area cleansed, making sure to leave some of the prepping solution back to take advantage of its long term bactericidal properties. EBL: None Materials & Medications Used:  Needle(s) Used: 22g - 1.5" Needle(s) Medications Administered today: Please see chart for details.  Imaging Guidance:  Type of Imaging Technique: None used Indication(s): N/A Exposure Time: No patient exposure Contrast: None used. Fluoroscopic Guidance: N/A Ultrasound Guidance: N/A Interpretation: N/A  Antibiotic Prophylaxis:  Indication(s): No indications identified. Type:  Antibiotics Given (last 72 hours)    None      Post-operative Assessment:  Complications: No immediate post-treatment complications observed by team, or reported by patient. Disposition: The patient tolerated the entire procedure well. A repeat set of vitals were taken after the procedure and the patient was kept under observation following institutional policy, for this type of procedure. Post-procedural neurological assessment was performed, showing return to baseline, prior to discharge. The patient was provided with post-procedure discharge instructions, including a section on how to identify potential problems. Should any problems arise concerning this procedure, the patient was given instructions to immediately contact us, at any time, without hesitation. In any case, we plan to contact the patient by telephone for a follow-up status report regarding this interventional procedure. Comments:  No additional relevant information.  Plan of Care  Discharge to: Discharge home  Medications ordered for procedure: Meds ordered this  encounter  Medications  . methylPREDNISolone acetate (DEPO-MEDROL) injection 40 mg  . methylPREDNISolone acetate (DEPO-MEDROL) injection 40 mg  . ropivacaine (PF) 2 mg/ml (0.2%) (NAROPIN) epidural 4 mL  . ropivacaine (PF) 2 mg/ml (0.2%) (NAROPIN) epidural 4 mL  . lidocaine (PF) (XYLOCAINE) 1 % injection 10 mL   Medications administered: (For more details, see medical record) We administered methylPREDNISolone acetate, methylPREDNISolone acetate, ropivacaine (PF) 2 mg/ml (0.2%), ropivacaine (PF) 2 mg/ml (0.2%), and lidocaine (PF). Lab-work, Procedure(s), & Referral(s) Ordered: Orders Placed This Encounter  Procedures  . KNEE INJECTION   Imaging Ordered: No results found for this or any previous visit. New Prescriptions   No medications on file   Physician-requested Follow-up:  Return in about 2 weeks (around 05/01/2016) for Post-Procedure evaluation.  Future Appointments Date Time Provider Department Center  04/18/2016 10:00 AM [facility] 2 [facility]  05/04/2016 2:00 PM Delano Metz, MD [facility] None   Primary Care Physician: Lauro Regulus, MD Location: [facility] Note by: Sydnee Levans. Laban Emperor, M.D, DABA, DABAPM, DABPM, DABIPP, FIPP  Disclaimer:  Medicine is not an exact science. The only guarantee in medicine is that nothing is guaranteed. It is important to note that the decision to proceed with this intervention was based on the information collected from the patient. The Data and conclusions were drawn from the patient's questionnaire, the interview, and the physical examination. Because the information was provided in large part by the patient, it cannot be guaranteed that it has not been purposely or unconsciously manipulated. Every effort has been made to obtain as much relevant data as possible for this evaluation. It is important to note that the conclusions that lead to this procedure are derived in large part from the available  data. Always take into account that the treatment will also be dependent on availability of resources and existing treatment guidelines, considered by other Pain Management Practitioners as being common knowledge and practice, at the time of the intervention. For Medico-Legal purposes, it is also important to point out that variation in procedural techniques and pharmacological choices are the acceptable norm. The  indications, contraindications, technique, and results of the above procedure should only be interpreted and judged by a Board-Certified Interventional Pain Specialist with extensive familiarity and expertise in the same exact procedure and technique. Attempts at providing opinions without similar or greater experience and expertise than that of the treating physician will be considered as inappropriate and unethical, and shall result in a formal complaint to the state medical board and applicable specialty societies.  Instructions provided at this appointment: Patient Instructions  Steps to Quit Smoking Smoking tobacco can be harmful to your health and can affect almost every organ in your body. Smoking puts you, and those around you, at risk for developing many serious chronic diseases. Quitting smoking is difficult, but it is one of the best things that you can do for your health. It is never too late to quit. What are the benefits of quitting smoking? When you quit smoking, you lower your risk of developing serious diseases and conditions, such as:  Lung cancer or lung disease, such as COPD.  Heart disease.  Stroke.  Heart attack.  Infertility.  Osteoporosis and bone fractures. Additionally, symptoms such as coughing, wheezing, and shortness of breath may get better when you quit. You may also find that you get sick less often because your body is stronger at fighting off colds and infections. If you are pregnant, quitting smoking can help to reduce your chances of having a baby of  low birth weight. How do I get ready to quit? When you decide to quit smoking, create a plan to make sure that you are successful. Before you quit:  Pick a date to quit. Set a date within the next two weeks to give you time to prepare.  Write down the reasons why you are quitting. Keep this list in places where you will see it often, such as on your bathroom mirror or in your car or wallet.  Identify the people, places, things, and activities that make you want to smoke (triggers) and avoid them. Make sure to take these actions:  Throw away all cigarettes at home, at work, and in your car.  Throw away smoking accessories, such as Set designer.  Clean your car and make sure to empty the ashtray.  Clean your home, including curtains and carpets.  Tell your family, friends, and coworkers that you are quitting. Support from your loved ones can make quitting easier.  Talk with your health care provider about your options for quitting smoking.  Find out what treatment options are covered by your health insurance. What strategies can I use to quit smoking? Talk with your healthcare provider about different strategies to quit smoking. Some strategies include:  Quitting smoking altogether instead of gradually lessening how much you smoke over a period of time. Research shows that quitting "cold [place]" is more successful than gradually quitting.  Attending in-person counseling to help you build problem-solving skills. You are more likely to have success in quitting if you attend several counseling sessions. Even short sessions of 10 minutes can be effective.  Finding resources and support systems that can help you to quit smoking and remain smoke-free after you quit. These resources are most helpful when you use them often. They can include:  Online chats with a Veterinary surgeon.  Telephone quitlines.  Printed Materials engineer.  Support groups or group counseling.  Text messaging  programs.  Mobile phone applications.  Taking medicines to help you quit smoking. (If you are pregnant or breastfeeding, talk with your  health care provider first.) Some medicines contain nicotine and some do not. Both types of medicines help with cravings, but the medicines that include nicotine help to relieve withdrawal symptoms. Your health care provider may recommend:  Nicotine patches, gum, or lozenges.  Nicotine inhalers or sprays.  Non-nicotine medicine that is taken by mouth. Talk with your health care provider about combining strategies, such as taking medicines while you are also receiving in-person counseling. Using these two strategies together makes you more likely to succeed in quitting than if you used either strategy on its own. If you are pregnant or breastfeeding, talk with your health care provider about finding counseling or other support strategies to quit smoking. Do not take medicine to help you quit smoking unless told to do so by your health care provider. What things can I do to make it easier to quit? Quitting smoking might feel overwhelming at first, but there is a lot that you can do to make it easier. Take these important actions:  Reach out to your family and friends and ask that they support and encourage you during this time. Call telephone quitlines, reach out to support groups, or work with a counselor for support.  Ask people who smoke to avoid smoking around you.  Avoid places that trigger you to smoke, such as bars, parties, or smoke-break areas at work.  Spend time around people who do not smoke.  Lessen stress in your life, because stress can be a smoking trigger for some people. To lessen stress, try:  Exercising regularly.  Deep-breathing exercises.  Yoga.  Meditating.  Performing a body scan. This involves closing your eyes, scanning your body from head to toe, and noticing which parts of your body are particularly tense. Purposefully  relax the muscles in those areas.  Download or purchase mobile phone or tablet apps (applications) that can help you stick to your quit plan by providing reminders, tips, and encouragement. There are many free apps, such as QuitGuide from the [employer] developer for Disease Control and Prevention). You can find other support for quitting smoking (smoking cessation) through smokefree.gov and other websites. How will I feel when I quit smoking? Within the first 24 hours of quitting smoking, you may start to feel some withdrawal symptoms. These symptoms are usually most noticeable 2-3 days after quitting, but they usually do not last beyond 2-3 weeks. Changes or symptoms that you might experience include:  Mood swings.  Restlessness, anxiety, or irritation.  Difficulty concentrating.  Dizziness.  Strong cravings for sugary foods in addition to nicotine.  Mild weight gain.  Constipation.  Nausea.  Coughing or a sore throat.  Changes in how your medicines work in your body.  A depressed mood.  Difficulty sleeping (insomnia). After the first 2-3 weeks of quitting, you may start to notice more positive results, such as:  Improved sense of smell and taste.  Decreased coughing and sore throat.  Slower heart rate.  Lower blood pressure.  Clearer skin.  The ability to breathe more easily.  Fewer sick days. Quitting smoking is very challenging for most people. Do not get discouraged if you are not successful the first time. Some people need to make many attempts to quit before they achieve long-term success. Do your best to stick to your quit plan, and talk with your health care provider if you have any questions or concerns. This information is not intended to replace advice given to you by your health care provider. Make sure you discuss  any questions you have with your health care provider. Document Released: 05/02/2001 Document Revised: 01/04/2016 Document Reviewed:  09/22/2014 Elsevier Interactive Patient Education  2017 [employer]. Steps to Quit Smoking Smoking tobacco can be bad for your health. It can also affect almost every organ in your body. Smoking puts you and people around you at risk for many serious long-lasting (chronic) diseases. Quitting smoking is hard, but it is one of the best things that you can do for your health. It is never too late to quit. What are the benefits of quitting smoking? When you quit smoking, you lower your risk for getting serious diseases and conditions. They can include:  Lung cancer or lung disease.  Heart disease.  Stroke.  Heart attack.  Not being able to have children (infertility).  Weak bones (osteoporosis) and broken bones (fractures). If you have coughing, wheezing, and shortness of breath, those symptoms may get better when you quit. You may also get sick less often. If you are pregnant, quitting smoking can help to lower your chances of having a baby of low birth weight. What can I do to help me quit smoking? Talk with your doctor about what can help you quit smoking. Some things you can do (strategies) include:  Quitting smoking totally, instead of slowly cutting back how much you smoke over a period of time.  Going to in-person counseling. You are more likely to quit if you go to many counseling sessions.  Using resources and support systems, such as:  Online chats with a Veterinary surgeon.  Phone quitlines.  Printed Materials engineer.  Support groups or group counseling.  Text messaging programs.  Mobile phone apps or applications.  Taking medicines. Some of these medicines may have nicotine in them. If you are pregnant or breastfeeding, do not take any medicines to quit smoking unless your doctor says it is okay. Talk with your doctor about counseling or other things that can help you. Talk with your doctor about using more than one strategy at the same time, such as taking medicines  while you are also going to in-person counseling. This can help make quitting easier. What things can I do to make it easier to quit? Quitting smoking might feel very hard at first, but there is a lot that you can do to make it easier. Take these steps:  Talk to your family and friends. Ask them to support and encourage you.  Call phone quitlines, reach out to support groups, or work with a Veterinary surgeon.  Ask people who smoke to not smoke around you.  Avoid places that make you want (trigger) to smoke, such as:  Bars.  Parties.  Smoke-break areas at work.  Spend time with people who do not smoke.  Lower the stress in your life. Stress can make you want to smoke. Try these things to help your stress:  Getting regular exercise.  Deep-breathing exercises.  Yoga.  Meditating.  Doing a body scan. To do this, close your eyes, focus on one area of your body at a time from head to toe, and notice which parts of your body are tense. Try to relax the muscles in those areas.  Download or buy apps on your mobile phone or tablet that can help you stick to your quit plan. There are many free apps, such as QuitGuide from the [employer] developer for Disease Control and Prevention). You can find more support from smokefree.gov and other websites. This information is not intended to replace advice given  to you by your health care provider. Make sure you discuss any questions you have with your health care provider. Document Released: 03/04/2009 Document Revised: 01/04/2016 Document Reviewed: 09/22/2014 Elsevier Interactive Patient Education  2017 [employer]

## 2016-04-17 NOTE — Patient Instructions (Signed)
Steps to Quit Smoking Smoking tobacco can be harmful to your health and can affect almost every organ in your body. Smoking puts you, and those around you, at risk for developing many serious chronic diseases. Quitting smoking is difficult, but it is one of the best things that you can do for your health. It is never too late to quit. What are the benefits of quitting smoking? When you quit smoking, you lower your risk of developing serious diseases and conditions, such as:  Lung cancer or lung disease, such as COPD.  Heart disease.  Stroke.  Heart attack.  Infertility.  Osteoporosis and bone fractures. Additionally, symptoms such as coughing, wheezing, and shortness of breath may get better when you quit. You may also find that you get sick less often because your body is stronger at fighting off colds and infections. If you are pregnant, quitting smoking can help to reduce your chances of having a baby of low birth weight. How do I get ready to quit? When you decide to quit smoking, create a plan to make sure that you are successful. Before you quit:  Pick a date to quit. Set a date within the next two weeks to give you time to prepare.  Write down the reasons why you are quitting. Keep this list in places where you will see it often, such as on your bathroom mirror or in your car or wallet.  Identify the people, places, things, and activities that make you want to smoke (triggers) and avoid them. Make sure to take these actions:  Throw away all cigarettes at home, at work, and in your car.  Throw away smoking accessories, such as Scientist, research (medical).  Clean your car and make sure to empty the ashtray.  Clean your home, including curtains and carpets.  Tell your family, friends, and coworkers that you are quitting. Support from your loved ones can make quitting easier.  Talk with your health care provider about your options for quitting smoking.  Find out what treatment  options are covered by your health insurance. What strategies can I use to quit smoking? Talk with your healthcare provider about different strategies to quit smoking. Some strategies include:  Quitting smoking altogether instead of gradually lessening how much you smoke over a period of time. Research shows that quitting "cold [place]" is more successful than gradually quitting.  Attending in-person counseling to help you build problem-solving skills. You are more likely to have success in quitting if you attend several counseling sessions. Even short sessions of 10 minutes can be effective.  Finding resources and support systems that can help you to quit smoking and remain smoke-free after you quit. These resources are most helpful when you use them often. They can include:  Online chats with a Social worker.  Telephone quitlines.  Printed Furniture conservator/restorer.  Support groups or group counseling.  Text messaging programs.  Mobile phone applications.  Taking medicines to help you quit smoking. (If you are pregnant or breastfeeding, talk with your health care provider first.) Some medicines contain nicotine and some do not. Both types of medicines help with cravings, but the medicines that include nicotine help to relieve withdrawal symptoms. Your health care provider may recommend:  Nicotine patches, gum, or lozenges.  Nicotine inhalers or sprays.  Non-nicotine medicine that is taken by mouth. Talk with your health care provider about combining strategies, such as taking medicines while you are also receiving in-person counseling. Using these two strategies together makes you more  likely to succeed in quitting than if you used either strategy on its own. If you are pregnant or breastfeeding, talk with your health care provider about finding counseling or other support strategies to quit smoking. Do not take medicine to help you quit smoking unless told to do so by your health care  provider. What things can I do to make it easier to quit? Quitting smoking might feel overwhelming at first, but there is a lot that you can do to make it easier. Take these important actions:  Reach out to your family and friends and ask that they support and encourage you during this time. Call telephone quitlines, reach out to support groups, or work with a counselor for support.  Ask people who smoke to avoid smoking around you.  Avoid places that trigger you to smoke, such as bars, parties, or smoke-break areas at work.  Spend time around people who do not smoke.  Lessen stress in your life, because stress can be a smoking trigger for some people. To lessen stress, try:  Exercising regularly.  Deep-breathing exercises.  Yoga.  Meditating.  Performing a body scan. This involves closing your eyes, scanning your body from head to toe, and noticing which parts of your body are particularly tense. Purposefully relax the muscles in those areas.  Download or purchase mobile phone or tablet apps (applications) that can help you stick to your quit plan by providing reminders, tips, and encouragement. There are many free apps, such as QuitGuide from the [employer] developer(Centers for Disease Control and Prevention). You can find other support for quitting smoking (smoking cessation) through smokefree.gov and other websites. How will I feel when I quit smoking? Within the first 24 hours of quitting smoking, you may start to feel some withdrawal symptoms. These symptoms are usually most noticeable 2-3 days after quitting, but they usually do not last beyond 2-3 weeks. Changes or symptoms that you might experience include:  Mood swings.  Restlessness, anxiety, or irritation.  Difficulty concentrating.  Dizziness.  Strong cravings for sugary foods in addition to nicotine.  Mild weight gain.  Constipation.  Nausea.  Coughing or a sore throat.  Changes in how your medicines work in your  body.  A depressed mood.  Difficulty sleeping (insomnia). After the first 2-3 weeks of quitting, you may start to notice more positive results, such as:  Improved sense of smell and taste.  Decreased coughing and sore throat.  Slower heart rate.  Lower blood pressure.  Clearer skin.  The ability to breathe more easily.  Fewer sick days. Quitting smoking is very challenging for most people. Do not get discouraged if you are not successful the first time. Some people need to make many attempts to quit before they achieve long-term success. Do your best to stick to your quit plan, and talk with your health care provider if you have any questions or concerns. This information is not intended to replace advice given to you by your health care provider. Make sure you discuss any questions you have with your health care provider. Document Released: 05/02/2001 Document Revised: 01/04/2016 Document Reviewed: 09/22/2014 Elsevier Interactive Patient Education  2017 [employer] Steps to Quit Smoking Smoking tobacco can be bad for your health. It can also affect almost every organ in your body. Smoking puts you and people around you at risk for many serious long-lasting (chronic) diseases. Quitting smoking is hard, but it is one of the best things that you can do for your health.  It is never too late to quit. What are the benefits of quitting smoking? When you quit smoking, you lower your risk for getting serious diseases and conditions. They can include:  Lung cancer or lung disease.  Heart disease.  Stroke.  Heart attack.  Not being able to have children (infertility).  Weak bones (osteoporosis) and broken bones (fractures). If you have coughing, wheezing, and shortness of breath, those symptoms may get better when you quit. You may also get sick less often. If you are pregnant, quitting smoking can help to lower your chances of having a baby of low birth weight. What can I do to  help me quit smoking? Talk with your doctor about what can help you quit smoking. Some things you can do (strategies) include:  Quitting smoking totally, instead of slowly cutting back how much you smoke over a period of time.  Going to in-person counseling. You are more likely to quit if you go to many counseling sessions.  Using resources and support systems, such as:  Online chats with a Veterinary surgeoncounselor.  Phone quitlines.  Printed Materials engineerself-help materials.  Support groups or group counseling.  Text messaging programs.  Mobile phone apps or applications.  Taking medicines. Some of these medicines may have nicotine in them. If you are pregnant or breastfeeding, do not take any medicines to quit smoking unless your doctor says it is okay. Talk with your doctor about counseling or other things that can help you. Talk with your doctor about using more than one strategy at the same time, such as taking medicines while you are also going to in-person counseling. This can help make quitting easier. What things can I do to make it easier to quit? Quitting smoking might feel very hard at first, but there is a lot that you can do to make it easier. Take these steps:  Talk to your family and friends. Ask them to support and encourage you.  Call phone quitlines, reach out to support groups, or work with a Veterinary surgeoncounselor.  Ask people who smoke to not smoke around you.  Avoid places that make you want (trigger) to smoke, such as:  Bars.  Parties.  Smoke-break areas at work.  Spend time with people who do not smoke.  Lower the stress in your life. Stress can make you want to smoke. Try these things to help your stress:  Getting regular exercise.  Deep-breathing exercises.  Yoga.  Meditating.  Doing a body scan. To do this, close your eyes, focus on one area of your body at a time from head to toe, and notice which parts of your body are tense. Try to relax the muscles in those  areas.  Download or buy apps on your mobile phone or tablet that can help you stick to your quit plan. There are many free apps, such as QuitGuide from the [employer] developer(Centers for Disease Control and Prevention). You can find more support from smokefree.gov and other websites. This information is not intended to replace advice given to you by your health care provider. Make sure you discuss any questions you have with your health care provider. Document Released: 03/04/2009 Document Revised: 01/04/2016 Document Reviewed: 09/22/2014 Elsevier Interactive Patient Education  2017 [employer]

## 2016-04-17 NOTE — Progress Notes (Signed)
Safety precautions to be maintained throughout the outpatient stay will include: orient to surroundings, keep bed in low position, maintain call bell within reach at all times, provide assistance with transfer out of bed and ambulation.  

## 2016-04-18 ENCOUNTER — Telehealth: Payer: Self-pay | Admitting: *Deleted

## 2016-04-18 ENCOUNTER — Ambulatory Visit: Payer: Medicare HMO

## 2016-04-18 NOTE — Telephone Encounter (Signed)
LVM for patient to return call for any post-procedure concerns. - 

## 2016-05-04 ENCOUNTER — Encounter: Payer: Self-pay | Admitting: Pain Medicine

## 2016-05-04 ENCOUNTER — Ambulatory Visit: Payer: Medicare HMO | Attending: Pain Medicine | Admitting: Pain Medicine

## 2016-05-04 VITALS — BP 185/81 | HR 69 | Temp 98.6°F | Resp 16 | Ht 62.0 in | Wt 150.0 lb

## 2016-05-04 DIAGNOSIS — M19011 Primary osteoarthritis, right shoulder: Secondary | ICD-10-CM | POA: Insufficient documentation

## 2016-05-04 DIAGNOSIS — F325 Major depressive disorder, single episode, in full remission: Secondary | ICD-10-CM | POA: Insufficient documentation

## 2016-05-04 DIAGNOSIS — Z79891 Long term (current) use of opiate analgesic: Secondary | ICD-10-CM | POA: Insufficient documentation

## 2016-05-04 DIAGNOSIS — Z880 Allergy status to penicillin: Secondary | ICD-10-CM | POA: Diagnosis not present

## 2016-05-04 DIAGNOSIS — K219 Gastro-esophageal reflux disease without esophagitis: Secondary | ICD-10-CM | POA: Insufficient documentation

## 2016-05-04 DIAGNOSIS — M170 Bilateral primary osteoarthritis of knee: Secondary | ICD-10-CM | POA: Insufficient documentation

## 2016-05-04 DIAGNOSIS — Z833 Family history of diabetes mellitus: Secondary | ICD-10-CM | POA: Diagnosis not present

## 2016-05-04 DIAGNOSIS — M545 Low back pain, unspecified: Secondary | ICD-10-CM

## 2016-05-04 DIAGNOSIS — R7982 Elevated C-reactive protein (CRP): Secondary | ICD-10-CM | POA: Insufficient documentation

## 2016-05-04 DIAGNOSIS — Z5181 Encounter for therapeutic drug level monitoring: Secondary | ICD-10-CM | POA: Diagnosis not present

## 2016-05-04 DIAGNOSIS — I10 Essential (primary) hypertension: Secondary | ICD-10-CM | POA: Diagnosis not present

## 2016-05-04 DIAGNOSIS — M19012 Primary osteoarthritis, left shoulder: Secondary | ICD-10-CM | POA: Insufficient documentation

## 2016-05-04 DIAGNOSIS — M5412 Radiculopathy, cervical region: Secondary | ICD-10-CM | POA: Diagnosis not present

## 2016-05-04 DIAGNOSIS — M810 Age-related osteoporosis without current pathological fracture: Secondary | ICD-10-CM | POA: Insufficient documentation

## 2016-05-04 DIAGNOSIS — F17210 Nicotine dependence, cigarettes, uncomplicated: Secondary | ICD-10-CM | POA: Diagnosis not present

## 2016-05-04 DIAGNOSIS — Z79899 Other long term (current) drug therapy: Secondary | ICD-10-CM | POA: Diagnosis not present

## 2016-05-04 DIAGNOSIS — Z8249 Family history of ischemic heart disease and other diseases of the circulatory system: Secondary | ICD-10-CM | POA: Insufficient documentation

## 2016-05-04 DIAGNOSIS — G894 Chronic pain syndrome: Secondary | ICD-10-CM | POA: Insufficient documentation

## 2016-05-04 DIAGNOSIS — Z22322 Carrier or suspected carrier of Methicillin resistant Staphylococcus aureus: Secondary | ICD-10-CM

## 2016-05-04 DIAGNOSIS — Z882 Allergy status to sulfonamides status: Secondary | ICD-10-CM | POA: Diagnosis not present

## 2016-05-04 DIAGNOSIS — M25561 Pain in right knee: Secondary | ICD-10-CM

## 2016-05-04 DIAGNOSIS — R700 Elevated erythrocyte sedimentation rate: Secondary | ICD-10-CM | POA: Diagnosis not present

## 2016-05-04 DIAGNOSIS — Z885 Allergy status to narcotic agent status: Secondary | ICD-10-CM | POA: Insufficient documentation

## 2016-05-04 DIAGNOSIS — M25562 Pain in left knee: Secondary | ICD-10-CM

## 2016-05-04 DIAGNOSIS — G8929 Other chronic pain: Secondary | ICD-10-CM

## 2016-05-04 DIAGNOSIS — J449 Chronic obstructive pulmonary disease, unspecified: Secondary | ICD-10-CM | POA: Insufficient documentation

## 2016-05-04 DIAGNOSIS — M25511 Pain in right shoulder: Secondary | ICD-10-CM | POA: Diagnosis not present

## 2016-05-04 DIAGNOSIS — Z888 Allergy status to other drugs, medicaments and biological substances status: Secondary | ICD-10-CM | POA: Diagnosis not present

## 2016-05-04 DIAGNOSIS — M25512 Pain in left shoulder: Secondary | ICD-10-CM

## 2016-05-04 DIAGNOSIS — F1190 Opioid use, unspecified, uncomplicated: Secondary | ICD-10-CM

## 2016-05-04 MED ORDER — OXYCODONE HCL 5 MG PO TABS: 5.0000 mg | ORAL_TABLET | Freq: Four times a day (QID) | ORAL | 0 refills | Status: DC | PRN

## 2016-05-04 NOTE — Progress Notes (Signed)
Nursing Pain Medication Assessment:  Safety precautions to be maintained throughout the outpatient stay will include: orient to surroundings, keep bed in low position, maintain call bell within reach at all times, provide assistance with transfer out of bed and ambulation.  Medication Inspection Compliance: Pill count conducted under aseptic conditions, in front of the patient. Neither the pills nor the bottle was removed from the patient's sight at any time. Once count was completed pills were immediately returned to the patient in their original bottle.  Medication: Oxycodone IR Pill Count: 7 of 120 pills remain Bottle Appearance: Standard pharmacy container. Clearly labeled. Filled Date: 11 /18/ 2017 Medication last intake: 05-04-16 at 0900

## 2016-05-04 NOTE — Patient Instructions (Signed)
GENERAL RISKS AND COMPLICATIONS  What are the risk, side effects and possible complications? Generally speaking, most procedures are safe.  However, with any procedure there are risks, side effects, and the possibility of complications.  The risks and complications are dependent upon the sites that are lesioned, or the type of nerve block to be performed.  The closer the procedure is to the spine, the more serious the risks are.  Great care is taken when placing the radio frequency needles, block needles or lesioning probes, but sometimes complications can occur. 1. Infection: Any time there is an injection through the skin, there is a risk of infection.  This is why sterile conditions are used for these blocks.  There are four possible types of infection. 1. Localized skin infection. 2. Central Nervous System Infection-This can be in the form of Meningitis, which can be deadly. 3. Epidural Infections-This can be in the form of an epidural abscess, which can cause pressure inside of the spine, causing compression of the spinal cord with subsequent paralysis. This would require an emergency surgery to decompress, and there are no guarantees that the patient would recover from the paralysis. 4. Discitis-This is an infection of the intervertebral discs.  It occurs in about 1% of discography procedures.  It is difficult to treat and it may lead to surgery.        2. Pain: the needles have to go through skin and soft tissues, will cause soreness.       3. Damage to internal structures:  The nerves to be lesioned may be near blood vessels or    other nerves which can be potentially damaged.       4. Bleeding: Bleeding is more common if the patient is taking blood thinners such as  aspirin, Coumadin, Ticiid, Plavix, etc., or if he/she have some genetic predisposition  such as hemophilia. Bleeding into the spinal canal can cause compression of the spinal  cord with subsequent paralysis.  This would require an  emergency surgery to  decompress and there are no guarantees that the patient would recover from the  paralysis.       5. Pneumothorax:  Puncturing of a lung is a possibility, every time a needle is introduced in  the area of the chest or upper back.  Pneumothorax refers to free air around the  collapsed lung(s), inside of the thoracic cavity (chest cavity).  Another two possible  complications related to a similar event would include: Hemothorax and Chylothorax.   These are variations of the Pneumothorax, where instead of air around the collapsed  lung(s), you may have blood or chyle, respectively.       6. Spinal headaches: They may occur with any procedures in the area of the spine.       7. Persistent CSF (Cerebro-Spinal Fluid) leakage: This is a rare problem, but may occur  with prolonged intrathecal or epidural catheters either due to the formation of a fistulous  track or a dural tear.       8. Nerve damage: By working so close to the spinal cord, there is always a possibility of  nerve damage, which could be as serious as a permanent spinal cord injury with  paralysis.       9. Death:  Although rare, severe deadly allergic reactions known as "Anaphylactic  reaction" can occur to any of the medications used.      10. Worsening of the symptoms:  We can always make thing worse.    What are the chances of something like this happening? Chances of any of this occuring are extremely low.  By statistics, you have more of a chance of getting killed in a motor vehicle accident: while driving to the hospital than any of the above occurring .  Nevertheless, you should be aware that they are possibilities.  In general, it is similar to taking a shower.  Everybody knows that you can slip, hit your head and get killed.  Does that mean that you should not shower again?  Nevertheless always keep in mind that statistics do not mean anything if you happen to be on the wrong side of them.  Even if a procedure has a 1  (one) in a 1,000,000 (million) chance of going wrong, it you happen to be that one..Also, keep in mind that by statistics, you have more of a chance of having something go wrong when taking medications.  Who should not have this procedure? If you are on a blood thinning medication (e.g. Coumadin, Plavix, see list of "Blood Thinners"), or if you have an active infection going on, you should not have the procedure.  If you are taking any blood thinners, please inform your physician.  How should I prepare for this procedure?  Do not eat or drink anything at least six hours prior to the procedure.  Bring a driver with you .  It cannot be a taxi.  Come accompanied by an adult that can drive you back, and that is strong enough to help you if your legs get weak or numb from the local anesthetic.  Take all of your medicines the morning of the procedure with just enough water to swallow them.  If you have diabetes, make sure that you are scheduled to have your procedure done first thing in the morning, whenever possible.  If you have diabetes, take only half of your insulin dose and notify our nurse that you have done so as soon as you arrive at the clinic.  If you are diabetic, but only take blood sugar pills (oral hypoglycemic), then do not take them on the morning of your procedure.  You may take them after you have had the procedure.  Do not take aspirin or any aspirin-containing medications, at least eleven (11) days prior to the procedure.  They may prolong bleeding.  Wear loose fitting clothing that may be easy to take off and that you would not mind if it got stained with Betadine or blood.  Do not wear any jewelry or perfume  Remove any nail coloring.  It will interfere with some of our monitoring equipment.  NOTE: Remember that this is not meant to be interpreted as a complete list of all possible complications.  Unforeseen problems may occur.  BLOOD THINNERS The following drugs  contain aspirin or other products, which can cause increased bleeding during surgery and should not be taken for 2 weeks prior to and 1 week after surgery.  If you should need take something for relief of minor pain, you may take acetaminophen which is found in Tylenol,m Datril, Anacin-3 and Panadol. It is not blood thinner. The products listed below are.  Do not take any of the products listed below in addition to any listed on your instruction sheet.  A.P.C or A.P.C with Codeine Codeine Phosphate Capsules #3 Ibuprofen Ridaura  ABC compound Congesprin Imuran rimadil  Advil Cope Indocin Robaxisal  Alka-Seltzer Effervescent Pain Reliever and Antacid Coricidin or Coricidin-D  Indomethacin Rufen    Alka-Seltzer plus Cold Medicine Cosprin Ketoprofen S-A-C Tablets  Anacin Analgesic Tablets or Capsules Coumadin Korlgesic Salflex  Anacin Extra Strength Analgesic tablets or capsules CP-2 Tablets Lanoril Salicylate  Anaprox Cuprimine Capsules Levenox Salocol  Anexsia-D Dalteparin Magan Salsalate  Anodynos Darvon compound Magnesium Salicylate Sine-off  Ansaid Dasin Capsules Magsal Sodium Salicylate  Anturane Depen Capsules Marnal Soma  APF Arthritis pain formula Dewitt's Pills Measurin Stanback  Argesic Dia-Gesic Meclofenamic Sulfinpyrazone  Arthritis Bayer Timed Release Aspirin Diclofenac Meclomen Sulindac  Arthritis pain formula Anacin Dicumarol Medipren Supac  Analgesic (Safety coated) Arthralgen Diffunasal Mefanamic Suprofen  Arthritis Strength Bufferin Dihydrocodeine Mepro Compound Suprol  Arthropan liquid Dopirydamole Methcarbomol with Aspirin Synalgos  ASA tablets/Enseals Disalcid Micrainin Tagament  Ascriptin Doan's Midol Talwin  Ascriptin A/D Dolene Mobidin Tanderil  Ascriptin Extra Strength Dolobid Moblgesic Ticlid  Ascriptin with Codeine Doloprin or Doloprin with Codeine Momentum Tolectin  Asperbuf Duoprin Mono-gesic Trendar  Aspergum Duradyne Motrin or Motrin IB Triminicin  Aspirin  plain, buffered or enteric coated Durasal Myochrisine Trigesic  Aspirin Suppositories Easprin Nalfon Trillsate  Aspirin with Codeine Ecotrin Regular or Extra Strength Naprosyn Uracel  Atromid-S Efficin Naproxen Ursinus  Auranofin Capsules Elmiron Neocylate Vanquish  Axotal Emagrin Norgesic Verin  Azathioprine Empirin or Empirin with Codeine Normiflo Vitamin E  Azolid Emprazil Nuprin Voltaren  Bayer Aspirin plain, buffered or children's or timed BC Tablets or powders Encaprin Orgaran Warfarin Sodium  Buff-a-Comp Enoxaparin Orudis Zorpin  Buff-a-Comp with Codeine Equegesic Os-Cal-Gesic   Buffaprin Excedrin plain, buffered or Extra Strength Oxalid   Bufferin Arthritis Strength Feldene Oxphenbutazone   Bufferin plain or Extra Strength Feldene Capsules Oxycodone with Aspirin   Bufferin with Codeine Fenoprofen Fenoprofen Pabalate or Pabalate-SF   Buffets II Flogesic Panagesic   Buffinol plain or Extra Strength Florinal or Florinal with Codeine Panwarfarin   Buf-Tabs Flurbiprofen Penicillamine   Butalbital Compound Four-way cold tablets Penicillin   Butazolidin Fragmin Pepto-Bismol   Carbenicillin Geminisyn Percodan   Carna Arthritis Reliever Geopen Persantine   Carprofen Gold's salt Persistin   Chloramphenicol Goody's Phenylbutazone   Chloromycetin Haltrain Piroxlcam   Clmetidine heparin Plaquenil   Cllnoril Hyco-pap Ponstel   Clofibrate Hydroxy chloroquine Propoxyphen         Before stopping any of these medications, be sure to consult the physician who ordered them.  Some, such as Coumadin (Warfarin) are ordered to prevent or treat serious conditions such as "deep thrombosis", "pumonary embolisms", and other heart problems.  The amount of time that you may need off of the medication may also vary with the medication and the reason for which you were taking it.  If you are taking any of these medications, please make sure you notify your pain physician before you undergo any  procedures.         Epidural Steroid Injection An epidural steroid injection is a shot of steroid medicine and numbing medicine that is given into the space between the spinal cord and the bones in your back (epidural space). The shot helps relieve pain caused by an irritated or swollen nerve root. The amount of pain relief you get from the injection depends on what is causing the nerve to be swollen and irritated, and how long your pain lasts. You are more likely to benefit from this injection if your pain is strong and comes on suddenly rather than if you have had pain for a long time. Tell a health care provider about:  Any allergies you have.  All medicines you are taking, including vitamins, herbs,   eye drops, creams, and over-the-counter medicines.  Any problems you or family members have had with anesthetic medicines.  Any blood disorders you have.  Any surgeries you have had.  Any medical conditions you have.  Whether you are pregnant or may be pregnant. What are the risks? Generally, this is a safe procedure. However, problems may occur, including:  Headache.  Bleeding.  Infection.  Allergic reaction to medicines.  Damage to your nerves.  What happens before the procedure? Staying hydrated Follow instructions from your health care provider about hydration, which may include:  Up to 2 hours before the procedure - you may continue to drink clear liquids, such as water, clear fruit juice, black coffee, and plain tea.  Eating and drinking restrictions Follow instructions from your health care provider about eating and drinking, which may include:  8 hours before the procedure - stop eating heavy meals or foods such as meat, fried foods, or fatty foods.  6 hours before the procedure - stop eating light meals or foods, such as toast or cereal.  6 hours before the procedure - stop drinking milk or drinks that contain milk.  2 hours before the procedure - stop  drinking clear liquids.  Medicine  You may be given medicines to lower anxiety.  Ask your health care provider about: ? Changing or stopping your regular medicines. This is especially important if you are taking diabetes medicines or blood thinners. ? Taking medicines such as aspirin and ibuprofen. These medicines can thin your blood. Do not take these medicines before your procedure if your health care provider instructs you not to. General instructions  Plan to have someone take you home from the hospital or clinic. What happens during the procedure?  You may receive a medicine to help you relax (sedative).  You will be asked to lie on your abdomen.  The injection site will be cleaned.  A numbing medicine (local anesthetic) will be used to numb the injection site.  A needle will be inserted through your skin into the epidural space. You may feel some discomfort when this happens. An X-ray machine will be used to make sure the needle is put as close as possible to the affected nerve.  A steroid medicine and a local anesthetic will be injected into the epidural space.  The needle will be removed.  A bandage (dressing) will be put over the injection site. What happens after the procedure?  Your blood pressure, heart rate, breathing rate, and blood oxygen level will be monitored until the medicines you were given have worn off.  Your arm or leg may feel weak or numb for a few hours.  The injection site may feel sore.  Do not drive for 24 hours if you received a sedative. This information is not intended to replace advice given to you by your health care provider. Make sure you discuss any questions you have with your health care provider. Document Released: 08/15/2007 Document Revised: 10/20/2015 Document Reviewed: 08/24/2015 [employer] Interactive Patient Education  2017 [employer]  

## 2016-05-04 NOTE — Progress Notes (Signed)
Patient's Name: Jennifer Rivers  MRN: [id number]  Referring Provider: Kirk Ruths, MD  DOB: 1946-02-16  PCP: Kirk Ruths, MD  DOS: 05/04/2016  Note by: Kathlen Brunswick. Dossie Arbour, MD  Service setting: Ambulatory outpatient  Specialty: Interventional Pain Management  Location: [facility] (AMB) Pain Management Facility    Patient type: Established   Primary Reason(s) for Visit: Encounter for prescription drug management & post-procedure evaluation of chronic illness with mild to moderate exacerbation(Level of risk: moderate) CC: Knee Pain (right)  HPI  Jennifer Rivers is a 70 y.o. year old, female patient, who comes today for a post-procedure evaluation and medication management. She has Adult idiopathic generalized osteoporosis; Atypical migraine; COPD (chronic obstructive pulmonary disease) ([place]); Generalized OA; BP (high blood pressure); Current tear of meniscus; Major depression in remission ([place]); Chronic knee pain (Location of Primary Source of Pain) (Bilateral) (R>L); Long term current use of opiate analgesic; Long term prescription opiate use; Opiate use (20 MME/Day); Encounter for pain management planning; Encounter for therapeutic drug level monitoring; Chronic low back pain (Location of Secondary source of pain) (Bilateral) (L>R); Tricompartmental knee arthropathy (Bilateral); Chronic upper back pain (between shoulder blades) (Bilateral) (R>L); Chronic shoulder pain (Location of Tertiary source of pain) (Bilateral) (L>R); 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 Her primarily concern today is the Knee Pain  (right)  Pain Assessment: Self-Reported Pain Score: 4 /10             Reported level is compatible with observation.       Pain Type: Chronic pain Pain Location: Knee Pain Orientation: Right Pain Descriptors / Indicators: Aching, Throbbing Pain Frequency: Intermittent  Jennifer Rivers was last seen on 04/17/2016 for a procedure. During today's appointment we reviewed Jennifer Rivers's post-procedure results, as well as her outpatient medication regimen.  Further details on both, my assessment(s), as well as the proposed treatment plan, please see below.  Controlled Substance Pharmacotherapy Assessment REMS (Risk Evaluation and Mitigation Strategy)  Analgesic: Oxycodone IR 5 mg 1 tablet by mouth every 6 hours (20 mg/day of oxycodone) MME/day: 30 mg/day.  [name], RN  05/04/2016  2:26 PM  Sign at close encounter Nursing Pain Medication Assessment:  Safety precautions to be maintained throughout the outpatient stay will include: orient to surroundings, keep bed in low position, maintain call bell within reach at all times, provide assistance with transfer out of bed and ambulation.  Medication Inspection Compliance: Pill count conducted under aseptic conditions, in front of the patient. Neither the pills nor the bottle was removed from the patient's sight at any time. Once count was completed pills were immediately returned to the patient in their original bottle.  Medication: Oxycodone IR Pill Count: 7 of 120 pills remain Bottle Appearance: Standard pharmacy container. Clearly labeled. Filled Date: 11 /18/ 2017 Medication last intake: 05-04-16 at 0900   Pharmacokinetics: Liberation and absorption (onset of action): WNL Distribution (time to peak effect): WNL Metabolism and excretion (duration of action): WNL         Pharmacodynamics: Desired effects: Analgesia: Jennifer Rivers reports >50% benefit. Functional ability: Patient reports that medication allows her to accomplish basic  ADLs Clinically meaningful improvement in function (CMIF): Sustained CMIF goals met Perceived effectiveness: Described as relatively  effective, allowing for increase in activities of daily living (ADL) Undesirable effects: Side-effects or Adverse reactions: None reported Monitoring: [place] PMP: Online review of the past 21-monthperiod conducted. Compliant with practice rules and regulations List of all UDS test(s) done:  Lab Results  Component Value Date   TOXASSSELUR FINAL 04/06/2016   SUMMARY FINAL 12/28/2015   Last UDS on record: ToxAssure Select 13  Date Value Ref Range Status  04/06/2016 FINAL  Final    Comment:    ==================================================================== TOXASSURE SELECT 13 (MW) ==================================================================== Test                             Result       Flag       Units Drug Present and Declared for Prescription Verification   Oxycodone                      821          EXPECTED   ng/mg creat   Oxymorphone                    1518         EXPECTED   ng/mg creat   Noroxycodone                   1696         EXPECTED   ng/mg creat   Noroxymorphone                 296          EXPECTED   ng/mg creat    Sources of oxycodone are scheduled prescription medications.    Oxymorphone, noroxycodone, and noroxymorphone are expected    metabolites of oxycodone. Oxymorphone is also available as a    scheduled prescription medication. ==================================================================== Test                      Result    Flag   Units      Ref Range   Creatinine              28               mg/dL      >=20 ==================================================================== Declared Medications:  The flagging and interpretation on this report are based on the  following declared medications.  Unexpected results may arise from  inaccuracies in the declared medications.  **Note: The testing scope of this panel  includes these medications:  Oxycodone  **Note: The testing scope of this panel does not include following  reported medications:  Albuterol  Carvedilol  Cholecalciferol  Cyanocobalamin  Omeprazole ==================================================================== For clinical consultation, please call ([phone] ====================================================================    UDS interpretation: Compliant          Medication Assessment Form: Reviewed. Patient indicates being compliant with therapy Treatment compliance: Compliant Risk Assessment Profile: Aberrant behavior: See prior evaluations. None observed or detected today Comorbid factors increasing risk of overdose: See prior notes. No additional risks detected today Risk of substance use disorder (SUD): Low Opioid Risk Tool (ORT) Total Score:    Interpretation Table:  Score <3 = Low Risk for SUD  Score between 4-7 = Moderate Risk for SUD  Score >8 = High Risk for Opioid Abuse   Risk Mitigation Strategies:  Patient Counseling: Covered Patient-Prescriber Agreement (PPA): Present and active  Notification to other healthcare providers: Done  Pharmacologic Plan: No change in therapy, at this time  Post-Procedure Assessment  04/17/2016 Procedure: Diagnostic bilateral intra-articular knee joint injection Post-procedure pain score: 0/10 (100% relief) Influential Factors: BMI: 27.44 kg/m Intra-procedural challenges: None observed Assessment challenges: None detected         Post-procedural side-effects, adverse reactions, or complications: None reported Reported issues: None  Sedation: No sedation used. When no sedatives are used, the analgesic levels obtained are directly associated to the effectiveness of the local anesthetics. However, when sedation is provided, the level of analgesia obtained during the initial 1 hour following the intervention, is believed to be the result of a combination of factors.  These factors may include, but are not limited to: 1. The effectiveness of the local anesthetics used. 2. The effects of the analgesic(s) and/or anxiolytic(s) used. 3. The degree of discomfort experienced by the patient at the time of the procedure. 4. The patients ability and reliability in recalling and recording the events. 5. The presence and influence of possible secondary gains and/or psychosocial factors. Reported result: Relief experienced during the 1st hour after the procedure: 100 % (Ultra-Short Term Relief) Interpretative annotation: No Analgesic or Anxiolytic given, therefore benefits are completely due to Local Anesthetics.          Effects of local anesthetic: The analgesic effects attained during this period are directly associated to the localized infiltration of local anesthetics and therefore cary significant diagnostic value as to the etiological location, or anatomical origin, of the pain. Expected duration of relief is directly dependent on the pharmacodynamics of the local anesthetic used. Long-acting (4-6 hours) anesthetics used.  Reported result: Relief during the next 4 to 6 hour after the procedure: 98 % (Short-Term Relief) Interpretative annotation: Complete relief would suggest area to be the source of the pain.          Long-term benefit: Defined as the period of time past the expected duration of local anesthetics. With the possible exception of prolonged sympathetic blockade from the local anesthetics, benefits during this period are typically attributed to, or associated with, other factors such as analgesic sensory neuropraxia, antiinflammatory effects, or beneficial biochemical changes provided by agents other than the local anesthetics Reported result: Extended relief following procedure: 75 % (Long-Term Relief) Interpretative annotation: Good relief. This could suggest inflammation to be a significant component in the etiology to the pain.          Current  benefits: Defined as persistent relief that continues at this point in time.   Reported results: Treated area: 75 % In addition, the patient reports improvement in function Interpretative annotation: Ongoing benefits would suggest effective therapeutic approach  Interpretation: Results would suggest a successful diagnostic intervention.          Laboratory Chemistry  Inflammation Markers Lab Results  Component Value Date   ESRSEDRATE 55 (H) 12/28/2015   CRP 3.0 (H) 12/28/2015   Renal Function Lab Results  Component Value Date   BUN 18 12/28/2015   CREATININE 1.15 (H) 12/28/2015   GFRAA 56 (L) 12/28/2015   GFRNONAA 48 (L) 12/28/2015   Hepatic Function Lab Results  Component Value Date   AST 24 12/28/2015   ALT 17 12/28/2015   ALBUMIN 4.2 12/28/2015   Electrolytes Lab Results  Component Value Date   NA 134 (L) 12/28/2015   K 4.3 12/28/2015   CL 99 (L) 12/28/2015   CALCIUM 9.8 12/28/2015   MG 1.9 12/28/2015   Pain Modulating Vitamins Lab Results  Component Value Date  25OHVITD1 24 (L) 12/28/2015   25OHVITD2 2.5 12/28/2015   25OHVITD3 21 12/28/2015   VITAMINB12 176 (L) 12/28/2015   Coagulation Parameters Lab Results  Component Value Date   PLT 444 (H) 09/06/2015   Cardiovascular Lab Results  Component Value Date   HGB 9.5 (L) 09/06/2015   HCT 29.5 (L) 09/06/2015   Note: Lab results reviewed.  Recent Diagnostic Imaging Review  No results found. Note: Imaging results reviewed.          Meds  The patient has a current medication list which includes the following prescription(s): albuterol, carvedilol, cholecalciferol, cyanocobalamin, omeprazole, oxycodone, oxycodone, and oxycodone.  Current Outpatient Prescriptions on File Prior to Visit  Medication Sig  . albuterol (VENTOLIN HFA) 108 (90 BASE) MCG/ACT inhaler Take 2 puffs by mouth every 6 (six) hours as needed.  . carvedilol (COREG) 12.5 MG tablet 12.5 mg 2 (two) times daily.   . Cholecalciferol  (VITAMIN D-1000 MAX ST) 1000 units tablet Take by mouth daily.   . Cyanocobalamin (RA VITAMIN B-12 TR) 1000 MCG TBCR Take 1,000 mcg by mouth daily.   Marland Kitchen omeprazole (PRILOSEC) 20 MG capsule Take 20 mg by mouth daily.    No current facility-administered medications on file prior to visit.    ROS  Constitutional: Denies any fever or chills Gastrointestinal: No reported hemesis, hematochezia, vomiting, or acute GI distress Musculoskeletal: Denies any acute onset joint swelling, redness, loss of ROM, or weakness Neurological: No reported episodes of acute onset apraxia, aphasia, dysarthria, agnosia, amnesia, paralysis, loss of coordination, or loss of consciousness  Allergies  Ms. Krell is allergic to bupropion; codeine; gabapentin; tramadol; penicillins; and sulfa antibiotics.  Conception  Drug: Ms. Lorah  reports that she does not use drugs. Alcohol:  reports that she does not drink alcohol. Tobacco:  reports that she has been smoking Cigarettes.  She has never used smokeless tobacco. Medical:  has a past medical history of Asthma; GERD (gastroesophageal reflux disease); Hypertension; MRSA (methicillin resistant staph aureus) culture positive (08/2015); Multilevel degenerative disc disease; Osteoarthritis; and Osteoporosis. Family: family history includes Diabetes in her mother; Heart attack in her father.  Past Surgical History:  Procedure Laterality Date  . ABDOMINAL HYSTERECTOMY    . KNEE ARTHROSCOPY Right 09/15/2015   Procedure: ARTHROSCOPY KNEE partial medial and lateral menisectomy, chondroplasty;  Surgeon: Leanor Kail, MD;  Location: [facility] ORS;  Service: Orthopedics;  Laterality: Right;  . TONSILLECTOMY     Constitutional Exam  General appearance: Well nourished, well developed, and well hydrated. In no apparent acute distress Vitals:   05/04/16 1420  BP: (!) 185/81  Pulse: 69  Resp: 16  Temp: 98.6 F (37 C)  TempSrc: Oral  SpO2: 99%  Weight: 150 lb (68 kg)  Height: _0   (1.575 m)   BMI Assessment: Estimated body mass index is 27.44 kg/m as calculated from the following:   Height as of this encounter: _1  (1.575 m).   Weight as of this encounter: 150 lb (68 kg).  BMI interpretation table: BMI level Category Range association with higher incidence of chronic pain  <18 kg/m2 Underweight   18.5-24.9 kg/m2 Ideal body weight   25-29.9 kg/m2 Overweight Increased incidence by 20%  30-34.9 kg/m2 Obese (Class I) Increased incidence by 68%  35-39.9 kg/m2 Severe obesity (Class II) Increased incidence by 136%  >40 kg/m2 Extreme obesity (Class III) Increased incidence by 254%   BMI Readings from Last 4 Encounters:  05/04/16 27.44 kg/m  04/17/16 27.44 kg/m  04/06/16 27.44 kg/m  03/09/16 27.44 kg/m   Wt Readings from Last 4 Encounters:  05/04/16 150 lb (68 kg)  04/17/16 150 lb (68 kg)  04/06/16 150 lb (68 kg)  03/09/16 150 lb (68 kg)  Psych/Mental status: Alert, oriented x 3 (person, place, & time) Eyes: PERLA Respiratory: No evidence of acute respiratory distress  Cervical Spine Exam  Inspection: No masses, redness, or swelling Alignment: Symmetrical Functional ROM: Unrestricted ROM Stability: No instability detected Muscle strength & Tone: Functionally intact Sensory: Unimpaired Palpation: Non-contributory  Upper Extremity (UE) Exam    Side: Right upper extremity  Side: Left upper extremity  Inspection: No masses, redness, swelling, or asymmetry  Inspection: No masses, redness, swelling, or asymmetry  Functional ROM: Unrestricted ROM          Functional ROM: Unrestricted ROM          Muscle strength & Tone: Functionally intact  Muscle strength & Tone: Functionally intact  Sensory: Unimpaired  Sensory: Unimpaired  Palpation: Non-contributory  Palpation: Non-contributory   Thoracic Spine Exam  Inspection: No masses, redness, or swelling Alignment: Symmetrical Functional ROM: Unrestricted ROM Stability: No instability detected Sensory:  Unimpaired Muscle strength & Tone: Functionally intact Palpation: Non-contributory  Lumbar Spine Exam  Inspection: No masses, redness, or swelling Alignment: Symmetrical Functional ROM: Unrestricted ROM Stability: No instability detected Muscle strength & Tone: Functionally intact Sensory: Unimpaired Palpation: Non-contributory Provocative Tests: Lumbar Hyperextension and rotation test: evaluation deferred today       Patrick's Maneuver: evaluation deferred today              Gait & Posture Assessment  Ambulation: Unassisted Gait: Relatively normal for age and body habitus Posture: WNL   Lower Extremity Exam    Side: Right lower extremity  Side: Left lower extremity  Inspection: No masses, redness, swelling, or asymmetry  Inspection: No masses, redness, swelling, or asymmetry  Functional ROM: Unrestricted ROM          Functional ROM: Unrestricted ROM          Muscle strength & Tone: Functionally intact  Muscle strength & Tone: Functionally intact  Sensory: Unimpaired  Sensory: Unimpaired  Palpation: Non-contributory  Palpation: Non-contributory   Assessment  Primary Diagnosis & Pertinent Problem List: The primary encounter diagnosis was Chronic pain syndrome. Diagnoses of Chronic knee pain (Location of Primary Source of Pain) (Bilateral) (R>L), Chronic low back pain (Location of Secondary source of pain) (Bilateral) (L>R), Chronic shoulder pain (Location of Tertiary source of pain) (Bilateral) (L>R), Chronic cervical radicular pain (C4/C5 dermatomes), MRSA (methicillin resistant staph aureus) culture positive, Long term current use of opiate analgesic, and Opiate use (20 MME/Day) were also pertinent to this visit.  Status Diagnosis   Stable  Stable  Stable 1. Chronic pain syndrome   2. Chronic knee pain (Location of Primary Source of Pain) (Bilateral) (R>L)   3. Chronic low back pain (Location of Secondary source of pain) (Bilateral) (L>R)   4. Chronic shoulder pain (Location  of Tertiary source of pain) (Bilateral) (L>R)   5. Chronic cervical radicular pain (C4/C5 dermatomes)   6. MRSA (methicillin resistant staph aureus) culture positive   7. Long term current use of opiate analgesic   8. Opiate use (20 MME/Day)      Plan of Care  Pharmacotherapy (Medications Ordered): Meds ordered this encounter  Medications  . oxyCODONE (OXY IR/ROXICODONE) 5 MG immediate release tablet    Sig: Take 1 tablet (5 mg total) by mouth every 6 (six) hours as needed for severe pain.  Dispense:  120 tablet    Refill:  0    Do not place this medication, or any other prescription from our practice, on "Automatic Refill". Patient may have prescription filled one day early if pharmacy is closed on scheduled refill date. Do not fill until: 05/08/16 To last until: 06/07/16  . oxyCODONE (OXY IR/ROXICODONE) 5 MG immediate release tablet    Sig: Take 1 tablet (5 mg total) by mouth every 6 (six) hours as needed for severe pain.    Dispense:  120 tablet    Refill:  0    Do not place this medication, or any other prescription from our practice, on "Automatic Refill". Patient may have prescription filled one day early if pharmacy is closed on scheduled refill date. Do not fill until: 06/07/16 To last until: 07/07/16  . oxyCODONE (OXY IR/ROXICODONE) 5 MG immediate release tablet    Sig: Take 1 tablet (5 mg total) by mouth every 6 (six) hours as needed for severe pain.    Dispense:  120 tablet    Refill:  0    Do not place this medication, or any other prescription from our practice, on "Automatic Refill". Patient may have prescription filled one day early if pharmacy is closed on scheduled refill date. Do not fill until: 07/07/16 To last until: 08/06/16   New Prescriptions   No medications on file   Medications administered today: Ms. Stanbery had no medications administered during this visit. Lab-work, procedure(s), and/or referral(s): Orders Placed This Encounter  Procedures  .  Cervical Epidural Injection   Imaging and/or referral(s): None  Interventional therapies: Planned, scheduled, and/or pending:   Diagnostic Left CESI under fluoro and IV sedation.   Considering:   Diagnostic bilateral intra-articular knee injection without fluoroscopy or IV sedation.  Possible series of 5 bilateral Hyalgan intra-articular knee injections without fluoroscopy or IV sedation.  Diagnostic bilateral genicular nerve blocks under fluoroscopic guidance, with or without sedation.  Possible bilateral genicular nerve radiofrequency ablation under fluoroscopic guidance and IV sedation the pending on the results of the diagnostic injections.  Diagnostic bilateral lumbar facet block under fluoroscopic guidance and IV sedation.  Possible bilateral lumbar facet radiofrequency ablation under fluoroscopic guidance and IV sedation the pending on the results of the diagnostic injection.  Diagnostic bilateral intra-articular shoulder joint injection under fluoroscopic guidance, with or without sedation.  Diagnostic bilateral suprascapular nerve block under fluoroscopic guidance, with or without sedation.  Possible bilateral suprascapular nerve radiofrequency ablation under fluoroscopic guidance and IV sedation.  Diagnostic right-sided L5-S1 transforaminal epidural steroid injection under fluoroscopic guidance, with or without sedation. Diagnostic left sided S1 transforaminal epidural steroid injection under fluoroscopic guidance, with or without sedation.  Diagnostic right-sided L4-5 lumbar epidural steroid injection under fluoroscopic guidance, with or without sedation.    Palliative PRN treatment(s):   Not at this time.   Provider-requested follow-up: Return in about 3 months (around 08/02/2016) for Med-Mgmt, in addition, procedure: Left CESI.  Future Appointments Date Time Provider Reading  08/01/2016 10:15 AM Milinda Pointer, MD [facility] None   Primary Care Physician:  Kirk Ruths, MD Location: [facility] Outpatient [facility] Management Facility Note by: Kathlen Brunswick. Dossie Arbour, M.D, DABA, DABAPM, DABPM, DABIPP, FIPP Date: 05/04/16; Time: 4:51 PM  Pain Score Disclaimer: We use the NRS-11 scale. This is a self-reported, subjective measurement of pain severity with only modest accuracy. It is used primarily to identify changes within a particular patient. It must be understood that outpatient pain scales are significantly less accurate that those  used for research, where they can be applied under ideal controlled circumstances with minimal exposure to variables. In reality, the score is likely to be a combination of pain intensity and pain affect, where pain affect describes the degree of emotional arousal or changes in action readiness caused by the sensory experience of pain. Factors such as social and work situation, setting, emotional state, anxiety levels, expectation, and prior pain experience may influence pain perception and show large inter-individual differences that may also be affected by time variables.  Patient instructions provided during this appointment: Patient Instructions   GENERAL RISKS AND COMPLICATIONS  What are the risk, side effects and possible complications? Generally speaking, most procedures are safe.  However, with any procedure there are risks, side effects, and the possibility of complications.  The risks and complications are dependent upon the sites that are lesioned, or the type of nerve block to be performed.  The closer the procedure is to the spine, the more serious the risks are.  Great care is taken when placing the radio frequency needles, block needles or lesioning probes, but sometimes complications can occur. 1. Infection: Any time there is an injection through the skin, there is a risk of infection.  This is why sterile conditions are used for these blocks.  There are four possible types of infection. 1. Localized skin  infection. 2. Central Nervous System Infection-This can be in the form of Meningitis, which can be deadly. 3. Epidural Infections-This can be in the form of an epidural abscess, which can cause pressure inside of the spine, causing compression of the spinal cord with subsequent paralysis. This would require an emergency surgery to decompress, and there are no guarantees that the patient would recover from the paralysis. 4. Discitis-This is an infection of the intervertebral discs.  It occurs in about 1% of discography procedures.  It is difficult to treat and it may lead to surgery.        2. Pain: the needles have to go through skin and soft tissues, will cause soreness.       3. Damage to internal structures:  The nerves to be lesioned may be near blood vessels or    other nerves which can be potentially damaged.       4. Bleeding: Bleeding is more common if the patient is taking blood thinners such as  aspirin, Coumadin, Ticiid, Plavix, etc., or if he/she have some genetic predisposition  such as hemophilia. Bleeding into the spinal canal can cause compression of the spinal  cord with subsequent paralysis.  This would require an emergency surgery to  decompress and there are no guarantees that the patient would recover from the  paralysis.       5. Pneumothorax:  Puncturing of a lung is a possibility, every time a needle is introduced in  the area of the chest or upper back.  Pneumothorax refers to free air around the  collapsed lung(s), inside of the thoracic cavity (chest cavity).  Another two possible  complications related to a similar event would include: Hemothorax and Chylothorax.   These are variations of the Pneumothorax, where instead of air around the collapsed  lung(s), you may have blood or chyle, respectively.       6. Spinal headaches: They may occur with any procedures in the area of the spine.       7. Persistent CSF (Cerebro-Spinal Fluid) leakage: This is a rare problem, but may occur   with prolonged intrathecal or epidural  catheters either due to the formation of a fistulous  track or a dural tear.       8. Nerve damage: By working so close to the spinal cord, there is always a possibility of  nerve damage, which could be as serious as a permanent spinal cord injury with  paralysis.       9. Death:  Although rare, severe deadly allergic reactions known as "Anaphylactic  reaction" can occur to any of the medications used.      10. Worsening of the symptoms:  We can always make thing worse.  What are the chances of something like this happening? Chances of any of this occuring are extremely low.  By statistics, you have more of a chance of getting killed in a motor vehicle accident: while driving to the hospital than any of the above occurring .  Nevertheless, you should be aware that they are possibilities.  In general, it is similar to taking a shower.  Everybody knows that you can slip, hit your head and get killed.  Does that mean that you should not shower again?  Nevertheless always keep in mind that statistics do not mean anything if you happen to be on the wrong side of them.  Even if a procedure has a 1 (one) in a 1,000,000 (million) chance of going wrong, it you happen to be that one..Also, keep in mind that by statistics, you have more of a chance of having something go wrong when taking medications.  Who should not have this procedure? If you are on a blood thinning medication (e.g. Coumadin, Plavix, see list of "Blood Thinners"), or if you have an active infection going on, you should not have the procedure.  If you are taking any blood thinners, please inform your physician.  How should I prepare for this procedure?  Do not eat or drink anything at least six hours prior to the procedure.  Bring a driver with you .  It cannot be a taxi.  Come accompanied by an adult that can drive you back, and that is strong enough to help you if your legs get weak or numb from the  local anesthetic.  Take all of your medicines the morning of the procedure with just enough water to swallow them.  If you have diabetes, make sure that you are scheduled to have your procedure done first thing in the morning, whenever possible.  If you have diabetes, take only half of your insulin dose and notify our nurse that you have done so as soon as you arrive at the clinic.  If you are diabetic, but only take blood sugar pills (oral hypoglycemic), then do not take them on the morning of your procedure.  You may take them after you have had the procedure.  Do not take aspirin or any aspirin-containing medications, at least eleven (11) days prior to the procedure.  They may prolong bleeding.  Wear loose fitting clothing that may be easy to take off and that you would not mind if it got stained with Betadine or blood.  Do not wear any jewelry or perfume  Remove any nail coloring.  It will interfere with some of our monitoring equipment.  NOTE: Remember that this is not meant to be interpreted as a complete list of all possible complications.  Unforeseen problems may occur.  BLOOD THINNERS The following drugs contain aspirin or other products, which can cause increased bleeding during surgery and should not be taken for  2 weeks prior to and 1 week after surgery.  If you should need take something for relief of minor pain, you may take acetaminophen which is found in Tylenol,m Datril, Anacin-3 and Panadol. It is not blood thinner. The products listed below are.  Do not take any of the products listed below in addition to any listed on your instruction sheet.  A.P.C or A.P.C with Codeine Codeine Phosphate Capsules #3 Ibuprofen Ridaura  ABC compound Congesprin Imuran rimadil  Advil Cope Indocin Robaxisal  Alka-Seltzer Effervescent Pain Reliever and Antacid Coricidin or Coricidin-D  Indomethacin Rufen  Alka-Seltzer plus Cold Medicine Cosprin Ketoprofen S-A-C Tablets  Anacin Analgesic  Tablets or Capsules Coumadin Korlgesic Salflex  Anacin Extra Strength Analgesic tablets or capsules CP-2 Tablets Lanoril Salicylate  Anaprox Cuprimine Capsules Levenox Salocol  Anexsia-D Dalteparin Magan Salsalate  Anodynos Darvon compound Magnesium Salicylate Sine-off  Ansaid Dasin Capsules Magsal Sodium Salicylate  Anturane Depen Capsules Marnal Soma  APF Arthritis pain formula Dewitt's Pills Measurin Stanback  Argesic Dia-Gesic Meclofenamic Sulfinpyrazone  Arthritis Bayer Timed Release Aspirin Diclofenac Meclomen Sulindac  Arthritis pain formula Anacin Dicumarol Medipren Supac  Analgesic (Safety coated) Arthralgen Diffunasal Mefanamic Suprofen  Arthritis Strength Bufferin Dihydrocodeine Mepro Compound Suprol  Arthropan liquid Dopirydamole Methcarbomol with Aspirin Synalgos  ASA tablets/Enseals Disalcid Micrainin Tagament  Ascriptin Doan's Midol Talwin  Ascriptin A/D Dolene Mobidin Tanderil  Ascriptin Extra Strength Dolobid Moblgesic Ticlid  Ascriptin with Codeine Doloprin or Doloprin with Codeine Momentum Tolectin  Asperbuf Duoprin Mono-gesic Trendar  Aspergum Duradyne Motrin or Motrin IB Triminicin  Aspirin plain, buffered or enteric coated Durasal Myochrisine Trigesic  Aspirin Suppositories Easprin Nalfon Trillsate  Aspirin with Codeine Ecotrin Regular or Extra Strength Naprosyn Uracel  Atromid-S Efficin Naproxen Ursinus  Auranofin Capsules Elmiron Neocylate Vanquish  Axotal Emagrin Norgesic Verin  Azathioprine Empirin or Empirin with Codeine Normiflo Vitamin E  Azolid Emprazil Nuprin Voltaren  Bayer Aspirin plain, buffered or children's or timed BC Tablets or powders Encaprin Orgaran Warfarin Sodium  Buff-a-Comp Enoxaparin Orudis Zorpin  Buff-a-Comp with Codeine Equegesic Os-Cal-Gesic   Buffaprin Excedrin plain, buffered or Extra Strength Oxalid   Bufferin Arthritis Strength Feldene Oxphenbutazone   Bufferin plain or Extra Strength Feldene Capsules Oxycodone with Aspirin    Bufferin with Codeine Fenoprofen Fenoprofen Pabalate or Pabalate-SF   Buffets II Flogesic Panagesic   Buffinol plain or Extra Strength Florinal or Florinal with Codeine Panwarfarin   Buf-Tabs Flurbiprofen Penicillamine   Butalbital Compound Four-way cold tablets Penicillin   Butazolidin Fragmin Pepto-Bismol   Carbenicillin Geminisyn Percodan   Carna Arthritis Reliever Geopen Persantine   Carprofen Gold's salt Persistin   Chloramphenicol Goody's Phenylbutazone   Chloromycetin Haltrain Piroxlcam   Clmetidine heparin Plaquenil   Cllnoril Hyco-pap Ponstel   Clofibrate Hydroxy chloroquine Propoxyphen         Before stopping any of these medications, be sure to consult the physician who ordered them.  Some, such as Coumadin (Warfarin) are ordered to prevent or treat serious conditions such as "deep thrombosis", "pumonary embolisms", and other heart problems.  The amount of time that you may need off of the medication may also vary with the medication and the reason for which you were taking it.  If you are taking any of these medications, please make sure you notify your pain physician before you undergo any procedures.         Epidural Steroid Injection An epidural steroid injection is a shot of steroid medicine and numbing medicine that is given into the space between the  spinal cord and the bones in your back (epidural space). The shot helps relieve pain caused by an irritated or swollen nerve root. The amount of pain relief you get from the injection depends on what is causing the nerve to be swollen and irritated, and how long your pain lasts. You are more likely to benefit from this injection if your pain is strong and comes on suddenly rather than if you have had pain for a long time. Tell a health care provider about:  Any allergies you have.  All medicines you are taking, including vitamins, herbs, eye drops, creams, and over-the-counter medicines.  Any problems you or family  members have had with anesthetic medicines.  Any blood disorders you have.  Any surgeries you have had.  Any medical conditions you have.  Whether you are pregnant or may be pregnant. What are the risks? Generally, this is a safe procedure. However, problems may occur, including:  Headache.  Bleeding.  Infection.  Allergic reaction to medicines.  Damage to your nerves. What happens before the procedure? Staying hydrated  Follow instructions from your health care provider about hydration, which may include:  Up to 2 hours before the procedure - you may continue to drink clear liquids, such as water, clear fruit juice, black coffee, and plain tea. Eating and drinking restrictions  Follow instructions from your health care provider about eating and drinking, which may include:  8 hours before the procedure - stop eating heavy meals or foods such as meat, fried foods, or fatty foods.  6 hours before the procedure - stop eating light meals or foods, such as toast or cereal.  6 hours before the procedure - stop drinking milk or drinks that contain milk.  2 hours before the procedure - stop drinking clear liquids. Medicine  You may be given medicines to lower anxiety.  Ask your health care provider about:  Changing or stopping your regular medicines. This is especially important if you are taking diabetes medicines or blood thinners.  Taking medicines such as aspirin and ibuprofen. These medicines can thin your blood. Do not take these medicines before your procedure if your health care provider instructs you not to. General instructions  Plan to have someone take you home from the hospital or clinic. What happens during the procedure?  You may receive a medicine to help you relax (sedative).  You will be asked to lie on your abdomen.  The injection site will be cleaned.  A numbing medicine (local anesthetic) will be used to numb the injection site.  A needle will  be inserted through your skin into the epidural space. You may feel some discomfort when this happens. An X-ray machine will be used to make sure the needle is put as close as possible to the affected nerve.  A steroid medicine and a local anesthetic will be injected into the epidural space.  The needle will be removed.  A bandage (dressing) will be put over the injection site. What happens after the procedure?  Your blood pressure, heart rate, breathing rate, and blood oxygen level will be monitored until the medicines you were given have worn off.  Your arm or leg may feel weak or numb for a few hours.  The injection site may feel sore.  Do not drive for 24 hours if you received a sedative. This information is not intended to replace advice given to you by your health care provider. Make sure you discuss any questions you have with  your health care provider. Document Released: 08/15/2007 Document Revised: 10/20/2015 Document Reviewed: 08/24/2015 Elsevier Interactive Patient Education  2017 [employer].

## 2016-05-30 ENCOUNTER — Telehealth: Payer: Self-pay | Admitting: Pain Medicine

## 2016-05-30 NOTE — Telephone Encounter (Signed)
Insurance verification this am for prior authorization for procedure - [employer] HMO INVALID per EPIC  LVM for patient for new insurance information so that I may proceed with prior authorization if required with new insurance.

## 2016-05-31 NOTE — Telephone Encounter (Signed)
Patient's daughter called in with new insurance information - [employer]

## 2016-06-12 ENCOUNTER — Encounter: Payer: Self-pay | Admitting: Pain Medicine

## 2016-06-12 ENCOUNTER — Ambulatory Visit (HOSPITAL_BASED_OUTPATIENT_CLINIC_OR_DEPARTMENT_OTHER): Payer: Medicare HMO | Admitting: Pain Medicine

## 2016-06-12 ENCOUNTER — Ambulatory Visit
Admission: RE | Admit: 2016-06-12 | Discharge: 2016-06-12 | Disposition: A | Discharge status: Home / Self Care | Payer: Medicare HMO | Source: Ambulatory Visit | Attending: Pain Medicine | Admitting: Pain Medicine

## 2016-06-12 VITALS — BP 147/70 | HR 77 | Temp 97.6°F | Resp 16 | Ht 62.0 in | Wt 150.0 lb

## 2016-06-12 DIAGNOSIS — Z882 Allergy status to sulfonamides status: Secondary | ICD-10-CM | POA: Insufficient documentation

## 2016-06-12 DIAGNOSIS — M5412 Radiculopathy, cervical region: Secondary | ICD-10-CM | POA: Insufficient documentation

## 2016-06-12 DIAGNOSIS — M549 Dorsalgia, unspecified: Secondary | ICD-10-CM

## 2016-06-12 DIAGNOSIS — M5410 Radiculopathy, site unspecified: Secondary | ICD-10-CM | POA: Insufficient documentation

## 2016-06-12 DIAGNOSIS — Z888 Allergy status to other drugs, medicaments and biological substances status: Secondary | ICD-10-CM | POA: Insufficient documentation

## 2016-06-12 DIAGNOSIS — M542 Cervicalgia: Secondary | ICD-10-CM | POA: Diagnosis present

## 2016-06-12 DIAGNOSIS — M25511 Pain in right shoulder: Secondary | ICD-10-CM | POA: Insufficient documentation

## 2016-06-12 DIAGNOSIS — G8929 Other chronic pain: Secondary | ICD-10-CM | POA: Diagnosis not present

## 2016-06-12 DIAGNOSIS — Z880 Allergy status to penicillin: Secondary | ICD-10-CM | POA: Insufficient documentation

## 2016-06-12 DIAGNOSIS — M25512 Pain in left shoulder: Secondary | ICD-10-CM | POA: Insufficient documentation

## 2016-06-12 DIAGNOSIS — M79605 Pain in left leg: Secondary | ICD-10-CM | POA: Diagnosis not present

## 2016-06-12 DIAGNOSIS — Z90710 Acquired absence of both cervix and uterus: Secondary | ICD-10-CM | POA: Insufficient documentation

## 2016-06-12 DIAGNOSIS — Z885 Allergy status to narcotic agent status: Secondary | ICD-10-CM | POA: Insufficient documentation

## 2016-06-12 DIAGNOSIS — M5440 Lumbago with sciatica, unspecified side: Secondary | ICD-10-CM

## 2016-06-12 DIAGNOSIS — M79604 Pain in right leg: Secondary | ICD-10-CM | POA: Insufficient documentation

## 2016-06-12 MED ORDER — SODIUM CHLORIDE 0.9 % IJ SOLN
INTRAMUSCULAR | Status: AC
  Administered 2016-06-12: 12:00:00
  Filled 2016-06-12: qty 10

## 2016-06-12 MED ORDER — SODIUM CHLORIDE 0.9% FLUSH: 1.0000 mL | Freq: Once | INTRAVENOUS | Status: DC

## 2016-06-12 MED ORDER — ROPIVACAINE HCL 2 MG/ML IJ SOLN
1.0000 mL | Freq: Once | INTRAMUSCULAR | Status: DC
  Filled 2016-06-12: qty 10

## 2016-06-12 MED ORDER — MIDAZOLAM HCL 5 MG/5ML IJ SOLN: 1.0000 mg | INTRAMUSCULAR | Status: DC | PRN

## 2016-06-12 MED ORDER — FENTANYL CITRATE (PF) 100 MCG/2ML IJ SOLN
INTRAMUSCULAR | Status: AC
  Administered 2016-06-12: 50 ug via INTRAVENOUS
  Filled 2016-06-12: qty 2

## 2016-06-12 MED ORDER — LIDOCAINE HCL (PF) 1 % IJ SOLN
10.0000 mL | Freq: Once | INTRAMUSCULAR | Status: DC
  Filled 2016-06-12: qty 10

## 2016-06-12 MED ORDER — FENTANYL CITRATE (PF) 100 MCG/2ML IJ SOLN: 25.0000 ug | INTRAMUSCULAR | Status: DC | PRN

## 2016-06-12 MED ORDER — DEXAMETHASONE SODIUM PHOSPHATE 4 MG/ML IJ SOLN
10.0000 mg | Freq: Once | INTRAMUSCULAR | Status: DC
  Filled 2016-06-12: qty 3

## 2016-06-12 MED ORDER — MIDAZOLAM HCL 5 MG/5ML IJ SOLN
INTRAMUSCULAR | Status: AC
  Administered 2016-06-12: 2 mg via INTRAVENOUS
  Filled 2016-06-12: qty 5

## 2016-06-12 MED ORDER — LACTATED RINGERS IV SOLN: 1000.0000 mL | Freq: Once | INTRAVENOUS | Status: DC

## 2016-06-12 MED ORDER — IOPAMIDOL (ISOVUE-M 200) INJECTION 41%
10.0000 mL | Freq: Once | INTRAMUSCULAR | Status: DC
  Filled 2016-06-12: qty 10

## 2016-06-12 NOTE — Patient Instructions (Signed)
Pain Management Discharge Instructions  General Discharge Instructions :  If you need to reach your doctor call: Monday-Friday 8:00 am - 4:00 pm at [phone] or toll free [phone].  After clinic hours [phone] to have operator reach doctor.  Bring all of your medication bottles to all your appointments in the pain clinic.  To cancel or reschedule your appointment with Pain Management please remember to call 24 hours in advance to avoid a fee.  Refer to the educational materials which you have been given on: General Risks, I had my Procedure. Discharge Instructions, Post Sedation.  Post Procedure Instructions:  The drugs you were given will stay in your system until tomorrow, so for the next 24 hours you should not drive, make any legal decisions or drink any alcoholic beverages.  You may eat anything you prefer, but it is better to start with liquids then soups and crackers, and gradually work up to solid foods.  Please notify your doctor immediately if you have any unusual bleeding, trouble breathing or pain that is not related to your normal pain.  Depending on the type of procedure that was done, some parts of your body may feel week and/or numb.  This usually clears up by tonight or the next day.  Walk with the use of an assistive device or accompanied by an adult for the 24 hours.  You may use ice on the affected area for the first 24 hours.  Put ice in a Ziploc bag and cover with a towel and place against area 15 minutes on 15 minutes off.  You may switch to heat after 24 hours.Epidural Steroid Injection An epidural steroid injection is a shot of steroid medicine and numbing medicine that is given into the space between the spinal cord and the bones in your back (epidural space). The shot helps relieve pain caused by an irritated or swollen nerve root. The amount of pain relief you get from the injection depends on what is causing the nerve to be swollen and irritated,  and how long your pain lasts. You are more likely to benefit from this injection if your pain is strong and comes on suddenly rather than if you have had pain for a long time. Tell a health care provider about:  Any allergies you have.  All medicines you are taking, including vitamins, herbs, eye drops, creams, and over-the-counter medicines.  Any problems you or family members have had with anesthetic medicines.  Any blood disorders you have.  Any surgeries you have had.  Any medical conditions you have.  Whether you are pregnant or may be pregnant. What are the risks? Generally, this is a safe procedure. However, problems may occur, including:  Headache.  Bleeding.  Infection.  Allergic reaction to medicines.  Damage to your nerves. What happens before the procedure? Staying hydrated  Follow instructions from your health care provider about hydration, which may include:  Up to 2 hours before the procedure - you may continue to drink clear liquids, such as water, clear fruit juice, black coffee, and plain tea. Eating and drinking restrictions  Follow instructions from your health care provider about eating and drinking, which may include:  8 hours before the procedure - stop eating heavy meals or foods such as meat, fried foods, or fatty foods.  6 hours before the procedure - stop eating light meals or foods, such as toast or cereal.  6 hours before the procedure - stop drinking milk or drinks that contain milk.    2 hours before the procedure - stop drinking clear liquids. Medicine  You may be given medicines to lower anxiety.  Ask your health care provider about:  Changing or stopping your regular medicines. This is especially important if you are taking diabetes medicines or blood thinners.  Taking medicines such as aspirin and ibuprofen. These medicines can thin your blood. Do not take these medicines before your procedure if your health care provider instructs  you not to. General instructions  Plan to have someone take you home from the hospital or clinic. What happens during the procedure?  You may receive a medicine to help you relax (sedative).  You will be asked to lie on your abdomen.  The injection site will be cleaned.  A numbing medicine (local anesthetic) will be used to numb the injection site.  A needle will be inserted through your skin into the epidural space. You may feel some discomfort when this happens. An X-ray machine will be used to make sure the needle is put as close as possible to the affected nerve.  A steroid medicine and a local anesthetic will be injected into the epidural space.  The needle will be removed.  A bandage (dressing) will be put over the injection site. What happens after the procedure?  Your blood pressure, heart rate, breathing rate, and blood oxygen level will be monitored until the medicines you were given have worn off.  Your arm or leg may feel weak or numb for a few hours.  The injection site may feel sore.  Do not drive for 24 hours if you received a sedative. This information is not intended to replace advice given to you by your health care provider. Make sure you discuss any questions you have with your health care provider. Document Released: 08/15/2007 Document Revised: 10/20/2015 Document Reviewed: 08/24/2015 Elsevier Interactive Patient Education  2017 [employer]  

## 2016-06-12 NOTE — Progress Notes (Signed)
Safety precautions to be maintained throughout the outpatient stay will include: orient to surroundings, keep bed in low position, maintain call bell within reach at all times, provide assistance with transfer out of bed and ambulation.  

## 2016-06-12 NOTE — Progress Notes (Signed)
Patient's Name: Jennifer Rivers  MRN: [id number]  Referring Provider: Lauro Regulus, MD  DOB: [birth year]  PCP: Lauro Regulus, MD  DOS: 06/12/2016  Note by: Sydnee Levans. Laban Emperor, MD  Service setting: Ambulatory outpatient  Location: [facility] (AMB) Pain Management Facility  Visit type: Procedure  Specialty: Interventional Pain Management  Patient type: Established   Primary Reason for Visit: Interventional Pain Management Treatment. CC: Neck Pain (left)  Procedure:  Anesthesia, Analgesia, Anxiolysis:  Type: Diagnostic, Inter-Laminar, Epidural Steroid Injection Region: Posterior Cervico-thoracic Region Level: C7-T1 Laterality: Left-Sided Paramedial  Type: Local Anesthesia with Moderate (Conscious) Sedation Local Anesthetic: Lidocaine 1% Route: Intravenous (IV) IV Access: Secured Sedation: Meaningful verbal contact was maintained at all times during the procedure  Indication(s): Analgesia and Anxiety  Indications: 1. Chronic cervical radicular pain (C4/C5 dermatomes) (L)   2. Radicular pain of shoulder   3. Chronic shoulder pain (Location of Tertiary source of pain) (Bilateral) (L>R)   4. Chronic upper back pain (between shoulder blades) (Bilateral) (R>L)   5. Chronic low back pain with sciatica, sciatica laterality unspecified, unspecified back pain laterality   6. Chronic pain of both lower extremities   7. 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 (year old), female patient, seen today for interventional treatment. She  has a past surgical history that includes Abdominal hysterectomy; Tonsillectomy; and Knee arthroscopy (Right, 09/15/2015). Her primarily concern today is the Neck Pain (left)  Initial Vital Signs: Blood pressure (!) 161/80, pulse 82, temperature 97.9 F (36.6 C), temperature  source Oral, resp. rate 16, height 5\' 2"  (1.575 m), weight 150 lb (68 kg), SpO2 99 %. BMI: 27.44 kg/m  Risk Assessment: Allergies: Reviewed. She is allergic to bupropion; codeine; gabapentin; tramadol; penicillins; and sulfa antibiotics. Allergy Precautions: None required Coagulopathies: "Reviewed. None identified.  Blood-thinner therapy: None at this time Active Infection(s): Reviewed. None identified. Jennifer Rivers is afebrile  Site Confirmation: Jennifer Rivers was asked to confirm the procedure and laterality before marking the site Procedure checklist: Completed Consent: Before the procedure and under the influence of no sedative(s), amnesic(s), or anxiolytics, the patient was informed of the treatment options, risks and possible complications. To fulfill our ethical and legal obligations, as recommended by the American Medical Association's Code of Ethics, I have informed the patient of my clinical impression; the nature and purpose of the treatment or procedure; the risks, benefits, and possible complications of the intervention; the alternatives, including doing nothing; the risk(s) and benefit(s) of the alternative treatment(s) or procedure(s); and the risk(s) and benefit(s) of doing nothing. The patient was provided information about the general risks and possible complications associated with the procedure. These may include, but are not limited to: failure to achieve desired goals, infection, bleeding, organ or nerve damage, allergic reactions, paralysis, and death. In addition, the patient was informed of those risks and complications associated to Spine-related procedures, such as failure to decrease pain; infection (i.e.: Meningitis, epidural or intraspinal abscess); bleeding (i.e.: epidural hematoma, subarachnoid hemorrhage, or any other type of intraspinal or peri-dural bleeding); organ or nerve damage (i.e.: Any type of peripheral nerve, nerve root, or spinal cord injury) with subsequent  damage to sensory, motor, and/or autonomic systems, resulting in permanent pain, numbness, and/or weakness of one or several areas of the body; allergic reactions; (i.e.: anaphylactic reaction); and/or death. Furthermore, the patient was informed  of those risks and complications associated with the medications. These include, but are not limited to: allergic reactions (i.e.: anaphylactic or anaphylactoid reaction(s)); adrenal axis suppression; blood sugar elevation that in diabetics may result in ketoacidosis or comma; water retention that in patients with history of congestive heart failure may result in shortness of breath, pulmonary edema, and decompensation with resultant heart failure; weight gain; swelling or edema; medication-induced neural toxicity; particulate matter embolism and blood vessel occlusion with resultant organ, and/or nervous system infarction; and/or aseptic necrosis of one or more joints. Finally, the patient was informed that Medicine is not an exact science; therefore, there is also the possibility of unforeseen or unpredictable risks and/or possible complications that may result in a catastrophic outcome. The patient indicated having understood very clearly. We have given the patient no guarantees and we have made no promises. Enough time was given to the patient to ask questions, all of which were answered to the patient's satisfaction. Jennifer Rivers has indicated that she wanted to continue with the procedure. Attestation: I, the ordering provider, attest that I have discussed with the patient the benefits, risks, side-effects, alternatives, likelihood of achieving goals, and potential problems during recovery for the procedure that I have provided informed consent. Date: 06/12/2016; Time: 11:42 AM  Pre-Procedure Preparation:  Monitoring: As per clinic protocol. Respiration, ETCO2, SpO2, BP, heart rate and rhythm monitor placed and checked for adequate function Safety Precautions:  Patient was assessed for positional comfort and pressure points before starting the procedure. Time-out: I initiated and conducted the "Time-out" before starting the procedure, as per protocol. The patient was asked to participate by confirming the accuracy of the "Time Out" information. Verification of the correct person, site, and procedure were performed and confirmed by me, the nursing staff, and the patient. "Time-out" conducted as per Joint Commission's Universal Protocol (UP.01.01.01). "Time-out" Date & Time: 06/12/2016; 1224 hrs.  Description of Procedure Process:   Position: Prone with head of the table was raised to facilitate breathing. Target Area: For Epidural Steroid injections the target is the interlaminar space, initially targeting the lower border of the superior vertebral body lamina. Approach: Paramedial approach. Area Prepped: Entire PosteriorCervical Region Prepping solution: ChloraPrep (2% chlorhexidine gluconate and 70% isopropyl alcohol) Safety Precautions: Aspiration looking for blood return was conducted prior to all injections. At no point did we inject any substances, as a needle was being advanced. No attempts were made at seeking any paresthesias. Safe injection practices and needle disposal techniques used. Medications properly checked for expiration dates. SDV (single dose vial) medications used. Description of the Procedure: Protocol guidelines were followed. The procedure needle was introduced through the skin, ipsilateral to the reported pain, and advanced to the target area. Bone was contacted and the needle walked caudad, until the lamina was cleared. The epidural space was identified using "loss-of-resistance technique" with 2-3 ml of PF-NaCl (0.9% NSS), in a 5cc LOR glass syringe. Start Time: 1224 hrs. End Time: 1230 hrs. Materials:  Needle(s) Type: Epidural needle Gauge: 22G Length: 3.5-in Medication(s): We administered fentaNYL, midazolam, and sodium  chloride. Please see chart orders for dosing details.  Imaging Guidance (Spinal):  Type of Imaging Technique: Fluoroscopy Guidance (Spinal) Indication(s): Assistance in needle guidance and placement for procedures requiring needle placement in or near specific anatomical locations not easily accessible without such assistance. Exposure Time: Please see nurses notes. Contrast: Before injecting any contrast, we confirmed that the patient did not have an allergy to iodine, shellfish, or radiological contrast. Once satisfactory needle placement  was completed at the desired level, radiological contrast was injected. Contrast injected under live fluoroscopy. No contrast complications. See chart for type and volume of contrast used. Fluoroscopic Guidance: I was personally present during the use of fluoroscopy. "Tunnel Vision Technique" used to obtain the best possible view of the target area. Parallax error corrected before commencing the procedure. "Direction-depth-direction" technique used to introduce the needle under continuous pulsed fluoroscopy. Once target was reached, antero-posterior, oblique, and lateral fluoroscopic projection used confirm needle placement in all planes. Images permanently stored in EMR. Interpretation: I personally interpreted the imaging intraoperatively. Adequate needle placement confirmed in multiple planes. Appropriate spread of contrast into desired area was observed. No evidence of afferent or efferent intravascular uptake. No intrathecal or subarachnoid spread observed. Permanent images saved into the patient's record.  Antibiotic Prophylaxis:  Indication(s): None identified Antibiotic given: None  Post-operative Assessment:  EBL: None Complications: No immediate post-treatment complications observed by team, or reported by patient. Note: The patient tolerated the entire procedure well. A repeat set of vitals were taken after the procedure and the patient was kept under  observation following institutional policy, for this type of procedure. Post-procedural neurological assessment was performed, showing return to baseline, prior to discharge. The patient was provided with post-procedure discharge instructions, including a section on how to identify potential problems. Should any problems arise concerning this procedure, the patient was given instructions to immediately contact us, at any time, without hesitation. In any case, we plan to contact the patient by telephone for a follow-up status report regarding this interventional procedure. Comments:  No additional relevant information.  Plan of Care  Disposition: Discharge home  Discharge Date & Time: 06/12/2016; 1310 hrs.  Physician-requested Follow-up:  Return in about 2 weeks (around 06/26/2016) for Post-Procedure evaluation.  Future Appointments Date Time Provider Department Center  07/17/2016 1:15 PM Delano Metz, MD [facility] None  08/01/2016 10:15 AM Delano Metz, MD [facility] None   Medications ordered for procedure: Meds ordered this encounter  Medications  . fentaNYL (SUBLIMAZE) 100 MCG/2ML injection    Vivia Ewing, Dena: cabinet override  . midazolam (VERSED) 5 MG/5ML injection    Vivia Ewing, Dena: cabinet override  . fentaNYL (SUBLIMAZE) injection 25-50 mcg    Make sure Narcan is available in the pyxis when using this medication. In the event of respiratory depression (RR< 8/min): Titrate NARCAN (naloxone) in increments of 0.1 to 0.2 mg IV at 2-3 minute intervals, until desired degree of reversal.  . lactated ringers infusion 1,000 mL  . midazolam (VERSED) 5 MG/5ML injection 1-2 mg    Make sure Flumazenil is available in the pyxis when using this medication. If oversedation occurs, administer 0.2 mg IV over 15 sec. If after 45 sec no response, administer 0.2 mg again over 1 min; may repeat at 1 min intervals; not to exceed 4 doses (1 mg)  . dexamethasone (DECADRON) injection 10 mg  . iopamidol  (ISOVUE-M) 41 % intrathecal injection 10 mL  . lidocaine (PF) (XYLOCAINE) 1 % injection 10 mL  . sodium chloride flush (NS) 0.9 % injection 1 mL  . ropivacaine (PF) 2 mg/mL (0.2%) (NAROPIN) injection 1 mL  . sodium chloride 0.9 % injection    Vivia Ewing, Dena: cabinet override   Medications administered: We administered fentaNYL, midazolam, and sodium chloride.  See the medical record for exact dosing, route, and time of administration.  Lab-work, Procedure(s), & Referral(s) Ordered: Orders Placed This Encounter  Procedures  . Cervical Epidural Injection  . DG C-Arm 1-60 Min-No Report  .  Discharge instructions  . Follow-up  . Informed Consent Details: Transcribe to consent form and obtain patient signature  . Provider attestation of informed consent for procedure/surgical case  . Verify informed consent   Imaging Ordered: No results found for this or any previous visit. New Prescriptions   No medications on file   Primary Care Physician: Lauro RegulusMarshall W Anderson, MD [facility] Note by: Sydnee LevansFrancisco A. Laban EmperorNaveira, M.D, DABA, DABAPM, DABPM, DABIPP, FIPP Date: 06/12/2016; Time: 4:10 PM  Disclaimer:  Medicine is not an Visual merchandiserexact science. The only guarantee in medicine is that nothing is guaranteed. It is important to note that the decision to proceed with this intervention was based on the information collected from the patient. The Data and conclusions were drawn from the patient's questionnaire, the interview, and the physical examination. Because the information was provided in large part by the patient, it cannot be guaranteed that it has not been purposely or unconsciously manipulated. Every effort has been made to obtain as much relevant data as possible for this evaluation. It is important to note that the conclusions that lead to this procedure are derived in large part from the available data. Always take into account that the treatment will also be  dependent on availability of resources and existing treatment guidelines, considered by other Pain Management Practitioners as being common knowledge and practice, at the time of the intervention. For Medico-Legal purposes, it is also important to point out that variation in procedural techniques and pharmacological choices are the acceptable norm. The indications, contraindications, technique, and results of the above procedure should only be interpreted and judged by a Board-Certified Interventional Pain Specialist with extensive familiarity and expertise in the same exact procedure and technique. Attempts at providing opinions without similar or greater experience and expertise than that of the treating physician will be considered as inappropriate and unethical, and shall result in a formal complaint to the state medical board and applicable specialty societies.  Instructions provided at this appointment: Patient Instructions  Pain Management Discharge Instructions  General Discharge Instructions :  If you need to reach your doctor call: Monday-Friday 8:00 am - 4:00 pm at [phone] or toll free [phone].  After clinic hours [phone] to have operator reach doctor.  Bring all of your medication bottles to all your appointments in the pain clinic.  To cancel or reschedule your appointment with Pain Management please remember to call 24 hours in advance to avoid a fee.  Refer to the educational materials which you have been given on: General Risks, I had my Procedure. Discharge Instructions, Post Sedation.  Post Procedure Instructions:  The drugs you were given will stay in your system until tomorrow, so for the next 24 hours you should not drive, make any legal decisions or drink any alcoholic beverages.  You may eat anything you prefer, but it is better to start with liquids then soups and crackers, and gradually work up to solid foods.  Please notify your doctor immediately if  you have any unusual bleeding, trouble breathing or pain that is not related to your normal pain.  Depending on the type of procedure that was done, some parts of your body may feel week and/or numb.  This usually clears up by tonight or the next day.  Walk with the use of an assistive device or accompanied by an adult for the 24 hours.  You may use ice on the affected area for the first 24 hours.  Put ice in  a Ziploc bag and cover with a towel and place against area 15 minutes on 15 minutes off.  You may switch to heat after 24 hours.Epidural Steroid Injection An epidural steroid injection is a shot of steroid medicine and numbing medicine that is given into the space between the spinal cord and the bones in your back (epidural space). The shot helps relieve pain caused by an irritated or swollen nerve root. The amount of pain relief you get from the injection depends on what is causing the nerve to be swollen and irritated, and how long your pain lasts. You are more likely to benefit from this injection if your pain is strong and comes on suddenly rather than if you have had pain for a long time. Tell a health care provider about:  Any allergies you have.  All medicines you are taking, including vitamins, herbs, eye drops, creams, and over-the-counter medicines.  Any problems you or family members have had with anesthetic medicines.  Any blood disorders you have.  Any surgeries you have had.  Any medical conditions you have.  Whether you are pregnant or may be pregnant. What are the risks? Generally, this is a safe procedure. However, problems may occur, including:  Headache.  Bleeding.  Infection.  Allergic reaction to medicines.  Damage to your nerves. What happens before the procedure? Staying hydrated  Follow instructions from your health care provider about hydration, which may include:  Up to 2 hours before the procedure - you may continue to drink clear liquids, such  as water, clear fruit juice, black coffee, and plain tea. Eating and drinking restrictions  Follow instructions from your health care provider about eating and drinking, which may include:  8 hours before the procedure - stop eating heavy meals or foods such as meat, fried foods, or fatty foods.  6 hours before the procedure - stop eating light meals or foods, such as toast or cereal.  6 hours before the procedure - stop drinking milk or drinks that contain milk.  2 hours before the procedure - stop drinking clear liquids. Medicine  You may be given medicines to lower anxiety.  Ask your health care provider about:  Changing or stopping your regular medicines. This is especially important if you are taking diabetes medicines or blood thinners.  Taking medicines such as aspirin and ibuprofen. These medicines can thin your blood. Do not take these medicines before your procedure if your health care provider instructs you not to. General instructions  Plan to have someone take you home from the hospital or clinic. What happens during the procedure?  You may receive a medicine to help you relax (sedative).  You will be asked to lie on your abdomen.  The injection site will be cleaned.  A numbing medicine (local anesthetic) will be used to numb the injection site.  A needle will be inserted through your skin into the epidural space. You may feel some discomfort when this happens. An X-ray machine will be used to make sure the needle is put as close as possible to the affected nerve.  A steroid medicine and a local anesthetic will be injected into the epidural space.  The needle will be removed.  A bandage (dressing) will be put over the injection site. What happens after the procedure?  Your blood pressure, heart rate, breathing rate, and blood oxygen level will be monitored until the medicines you were given have worn off.  Your arm or leg may feel weak or  numb for a few  hours.  The injection site may feel sore.  Do not drive for 24 hours if you received a sedative. This information is not intended to replace advice given to you by your health care provider. Make sure you discuss any questions you have with your health care provider. Document Released: 08/15/2007 Document Revised: 10/20/2015 Document Reviewed: 08/24/2015 Elsevier Interactive Patient Education  2017 [employer].

## 2016-06-13 NOTE — Telephone Encounter (Signed)
Voicemail left for patient to call our office if there are questions or concerns re; procedure on yesterday.  

## 2016-07-12 ENCOUNTER — Ambulatory Visit
Admission: EM | Admit: 2016-07-12 | Discharge: 2016-07-12 | Disposition: A | Discharge status: Home / Self Care | Payer: Medicare HMO | Attending: Family Medicine | Admitting: Family Medicine

## 2016-07-12 DIAGNOSIS — M545 Low back pain, unspecified: Secondary | ICD-10-CM

## 2016-07-12 DIAGNOSIS — G8929 Other chronic pain: Secondary | ICD-10-CM

## 2016-07-12 MED ORDER — KETOROLAC TROMETHAMINE 60 MG/2ML IM SOLN
30.0000 mg | Freq: Once | INTRAMUSCULAR | Status: AC
  Administered 2016-07-12: 30 mg via INTRAMUSCULAR

## 2016-07-12 MED ORDER — CYCLOBENZAPRINE HCL 5 MG PO TABS: 5.0000 mg | ORAL_TABLET | Freq: Two times a day (BID) | ORAL | 0 refills | Status: DC | PRN

## 2016-07-12 NOTE — ED Provider Notes (Signed)
[facility] ____________________________________________  Time seen: Approximately 4:02 PM  I have reviewed the triage vital signs and the nursing notes.   HISTORY  Chief Complaint Back Pain (lumbar)  HPI Laritza Vokes is a 71 y.o. female presenting for the complaints of right-sided low back pain. Patient reports that she has a chronic history of low back pain that is similar to current complaints, reports however for the last week or so per patient, she has had increased right lower back pain. Denies fall, injury or direct trauma. Denies known injury or trauma. States current pain is moderate, and reports pain is primarily with twisting and range of motion.  Describes pain as catching and spasms.States current pain is consistent with previous exacerbations. Patient states she has continued taking home pain medication, reports pain continues. States occasionally she feels pain in very low back and pelvic area, but states no abdominal pain. Denies vaginal complaints or bleeding. Denies pain wrapping around abdomen. Denies urinary frequency, urinary urgency or burning with urination or blood in urine. Patient reports this is not felt like a urinary tract infection. Denies pain radiation down leg. Denies paresthesias. Denies urinary or bowel retention or incontinence. Reports has continued to remain ambulatory.  Denies chest pain, shortness of breath, abdominal pain, dysuria, atypical extremity pain, atypical extremity swelling or rash. Denies recent sickness. Denies recent antibiotic use. Denies cardiac history. Denies renal insufficiency.  PCP: Dareen Piano PainLaban Emperor   Past Medical History:  Diagnosis Date  . Asthma   . GERD (gastroesophageal reflux disease)   . Hypertension   . MRSA (methicillin resistant staph aureus) culture positive 08/2015  . Multilevel degenerative disc disease   . Osteoarthritis   . Osteoporosis     Patient Active Problem List   Diagnosis Date  Noted  . Chronic pain syndrome 04/06/2016  . Osteoarthritis of knees (Bilateral) 04/06/2016  . Elevated sed rate 02/03/2016  . Elevated C-reactive protein (CRP) 02/03/2016  . Chronic knee pain (Location of Primary Source of Pain) (Bilateral) (R>L) 12/28/2015  . Long term current use of opiate analgesic 12/28/2015  . Long term prescription opiate use 12/28/2015  . Opiate use (20 MME/Day) 12/28/2015  . Encounter for pain management planning 12/28/2015  . Encounter for therapeutic drug level monitoring 12/28/2015  . Chronic low back pain (Location of Secondary source of pain) (Bilateral) (L>R) 12/28/2015  . Tricompartmental knee arthropathy (Bilateral) 12/28/2015  . Chronic upper back pain (between shoulder blades) (Bilateral) (R>L) 12/28/2015  . Chronic shoulder pain (Location of Tertiary source of pain) (Bilateral) (L>R) 12/28/2015  . 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 Chronic lower extremity pain (Bilateral) (R>L) 12/28/2015  . Chronic lower extremity radicular pain (Bilateral) (R>L) 12/28/2015  . MRSA (methicillin resistant staph aureus) culture positive 12/28/2015  . Radicular pain of shoulder 12/28/2015  . Chronic cervical radicular pain (C4/C5 dermatomes) (L) 12/28/2015  . Current tear of meniscus 08/23/2015  . 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 Atypical migraine 07/14/2015  . COPD (chronic obstructive pulmonary disease) (HCC) 07/14/2015  . Generalized OA 07/14/2015  . BP (high blood pressure) 07/14/2015  . Major depression in remission (HCC) 07/14/2015    Past Surgical History:  Procedure Laterality Date  . ABDOMINAL HYSTERECTOMY    . KNEE ARTHROSCOPY Right 09/15/2015   Procedure: ARTHROSCOPY KNEE partial medial and lateral menisectomy, chondroplasty;  Surgeon: Erin Sons, MD;  Location: [facility];  Service: Orthopedics;  Laterality: Right;  . TONSILLECTOMY        Current Facility-Administered Medications:  .  dexamethasone  (DECADRON) injection 10 mg, 10 mg, Other, Once, Delano Metz, MD .  fentaNYL (SUBLIMAZE) injection 25-50 mcg, 25-50 mcg, Intravenous, Q5 min PRN, Delano Metz, MD .  iopamidol (ISOVUE-M) 41 % intrathecal injection 10 mL, 10 mL, Epidural, Once, Delano Metz, MD .  lactated ringers infusion 1,000 mL, 1,000 mL, Intravenous, Once, Delano Metz, MD .  lidocaine (PF) (XYLOCAINE) 1 % injection 10 mL, 10 mL, Other, Once, Delano Metz, MD .  midazolam (VERSED) 5 MG/5ML injection 1-2 mg, 1-2 mg, Intravenous, Q5 min PRN, Delano Metz, MD .  ropivacaine (PF) 2 mg/mL (0.2%) (NAROPIN) injection 1 mL, 1 mL, Epidural, Once, Delano Metz, MD .  sodium chloride flush (NS) 0.9 % injection 1 mL, 1 mL, Other, Once, Delano Metz, MD  Current Outpatient Prescriptions:  .  albuterol (VENTOLIN HFA) 108 (90 BASE) MCG/ACT inhaler, Take 2 puffs by mouth every 6 (six) hours as needed., Disp: , Rfl:  .  carvedilol (COREG) 12.5 MG tablet, 12.5 mg 2 (two) times daily. , Disp: , Rfl:  .  Cholecalciferol (VITAMIN D-1000 MAX ST) 1000 units tablet, Take by mouth daily. , Disp: , Rfl:  .  Cyanocobalamin (RA VITAMIN B-12 TR) 1000 MCG TBCR, Take 1,000 mcg by mouth daily. , Disp: , Rfl:  .  omeprazole (PRILOSEC) 20 MG capsule, Take 20 mg by mouth daily. , Disp: , Rfl:  .  oxyCODONE (OXY IR/ROXICODONE) 5 MG immediate release tablet, Take 1 tablet (5 mg total) by mouth every 6 (six) hours as needed for severe pain., Disp: 120 tablet, Rfl: 0 .  cyclobenzaprine (FLEXERIL) 5 MG tablet, Take 1 tablet (5 mg total) by mouth 2 (two) times daily as needed for muscle spasms. Do not take with other pain medications. Do not drive or operate machinery while taking as can cause drowsiness., Disp: 6 tablet, Rfl: 0 .  oxyCODONE (OXY IR/ROXICODONE) 5 MG immediate release tablet, Take 1 tablet (5 mg total) by mouth every 6 (six) hours as needed for severe pain., Disp: 120 tablet, Rfl: 0 .  oxyCODONE (OXY  IR/ROXICODONE) 5 MG immediate release tablet, Take 1 tablet (5 mg total) by mouth every 6 (six) hours as needed for severe pain., Disp: 120 tablet, Rfl: 0  Allergies Bupropion; Codeine; Gabapentin; Tramadol; Penicillins; and Sulfa antibiotics  Family History  Problem Relation Age of Onset  . Diabetes Mother   . Heart attack Father     Social History Social History  Substance Use Topics  . Smoking status: Current Every Day Smoker    Packs/day: 0.50    Types: Cigarettes  . Smokeless tobacco: Never Used  . Alcohol use No    Review of Systems Constitutional: No fever/chills Eyes: No visual changes. ENT: No sore throat. Cardiovascular: Denies chest pain. Respiratory: Denies shortness of breath. Gastrointestinal: No abdominal pain.  No nausea, no vomiting.  No diarrhea.  No constipation. Genitourinary: Negative for dysuria. Musculoskeletal: Positive for back pain. Skin: Negative for rash. Neurological: Negative for headaches, focal weakness or numbness.  10-point ROS otherwise negative.  ____________________________________________   PHYSICAL EXAM:  VITAL SIGNS: ED Triage Vitals  Enc Vitals Group     BP 07/12/16 1531 (!) 174/88     Pulse Rate 07/12/16 1531 (!) 105     Resp 07/12/16 1531 16     Temp 07/12/16 1531 99.5 F (37.5 C)     Temp Source 07/12/16 1531 Oral     SpO2 07/12/16 1531 97 %     Weight 07/12/16 1531 149 lb (  67.6 kg)     Height 07/12/16 1531 5\' 2"  (1.575 m)     Head Circumference --      Peak Flow --      Pain Score 07/12/16 1533 9     Pain Loc --      Pain Edu? --      Excl. in GC? --    Vitals:   07/12/16 1531 07/12/16 1622  BP: (!) 174/88 (!) 156/89  Pulse: (!) 105 (!) 105  Resp: 16 16  Temp: 99.5 F (37.5 C) 98.6 F (37 C)  TempSrc: Oral Oral  SpO2: 97% 99%  Weight: 149 lb (67.6 kg)   Height: 5\' 2"  (1.575 m)      Constitutional: Alert and oriented. Well appearing and in no acute distress. Eyes: Conjunctivae are normal. PERRL.  EOMI. ENT      Head: Normocephalic and atraumatic.      Mouth/Throat: Mucous membranes are moist. Cardiovascular: Normal rate, regular rhythm. Grossly normal heart sounds.  Good peripheral circulation. Respiratory: Normal respiratory effort without tachypnea nor retractions. Breath sounds are clear and equal bilaterally. No wheezes, rales, rhonchi. Gastrointestinal: Soft and nontender. No distention. Normal Bowel sounds. No CVA tenderness. Musculoskeletal: Ambulatory with steady gait. No midline cervical or thoracic tenderness to palpation. Bilateral pedal pulses equal and easily palpated.      Right lower leg:  No tenderness or edema.      Left lower leg:  No tenderness or edema.  Except: Mild midline and right para lumbar test palpation, no swelling, no erythema, pain increases with lumbar flexion and extension as well as rotation, bilateral straight leg raises completed with mild pain with right straight leg test, no rash to back, no pain with left straight leg test, no saddle anesthesia, bilateral Achilles reflexes 2+ and equal, changes positions from lying to standing quickly. Sensation equal bilaterally to bilateral upper and lower extremities. Neurologic:  Normal speech and language. No gross focal neurologic deficits are appreciated. Speech is normal. No gait instability.  Skin:  Skin is warm, dry  Psychiatric: Mood and affect are normal. Speech and behavior are normal. Patient exhibits appropriate insight and judgment   ___________________________________________   LABS (all labs ordered are listed, but only abnormal results are displayed)  Labs Reviewed - No data to display   PROCEDURES Procedures   INITIAL IMPRESSION / ASSESSMENT AND PLAN / ED COURSE  Pertinent labs & imaging results that were available during my care of the patient were reviewed by me and considered in my medical decision making (see chart for details).  Well-appearing patient. No acute distress. Changes  positions quickly in room. Ambulatory with steady gait. Patient with chronic low back pain, suspect acute on chronic low back pain. Discussed evaluation of urine with patient, patient states does not feel like urinary infection and declines. Patient on chronic oxycodone 5 mg tablets 3-4 times a day with most recent refill prescription 07/07/2016 of quantity 120 from pain management. Discussed with patient in detail cannot manage her chronic pain medication. She reports Toradol helping her in past with similar pain. Patient describes spasming and catching pain that is present with movement, discussed will treat patient with 30 mg IM Toradol once in urgent care and twice a day when necessary Flexeril. Discussed strict do not take muscle relaxant with other pain medications.Discussed indication, risks and benefits of medications with patient.discussed with patient need to follow-up closely with her primary care physician as well as pain management this week.  Discussed follow  up with Primary care physician this week. Discussed follow up and return parameters including no resolution or any worsening concerns. Patient verbalized understanding and agreed to plan.   ____________________________________________   FINAL CLINICAL IMPRESSION(S) / ED DIAGNOSES  Final diagnoses:  Acute exacerbation of chronic low back pain     Discharge Medication List as of 07/12/2016  4:20 PM    START taking these medications   Details  cyclobenzaprine (FLEXERIL) 5 MG tablet Take 1 tablet (5 mg total) by mouth 2 (two) times daily as needed for muscle spasms. Do not take with other pain medications. Do not drive or operate machinery while taking as can cause drowsiness., Starting Wed 07/12/2016, Print        Note: This dictation was prepared with Dragon dictation along with smaller phrase technology. Any transcriptional errors that result from this process are unintentional.         Renford Dills, NP 07/12/16  1717

## 2016-07-12 NOTE — ED Triage Notes (Signed)
Patient complains of low back pain. Patient states that pain has been constant x 2 weeks. Patient reports that she sees pain management and they have her on Oxycodone and is not touching the pain.

## 2016-07-12 NOTE — Discharge Instructions (Signed)
Take medication as prescribed. Do not take muscle relaxant with pain medication. Stretch. Rest. Drink plenty of fluids.   Follow up with your back doctor tomorrow.  Follow up with your primary care physician this week as needed. Return to Urgent care or Emergency room for new or worsening concerns.

## 2016-07-17 ENCOUNTER — Ambulatory Visit: Payer: Medicare HMO | Attending: Pain Medicine | Admitting: Pain Medicine

## 2016-07-17 ENCOUNTER — Telehealth: Payer: Self-pay | Admitting: *Deleted

## 2016-07-17 ENCOUNTER — Encounter: Payer: Self-pay | Admitting: Pain Medicine

## 2016-07-17 VITALS — BP 161/75 | HR 111 | Temp 98.0°F | Resp 14 | Ht 62.0 in | Wt 150.0 lb

## 2016-07-17 DIAGNOSIS — Z885 Allergy status to narcotic agent status: Secondary | ICD-10-CM | POA: Diagnosis not present

## 2016-07-17 DIAGNOSIS — M79604 Pain in right leg: Secondary | ICD-10-CM

## 2016-07-17 DIAGNOSIS — M818 Other osteoporosis without current pathological fracture: Secondary | ICD-10-CM | POA: Diagnosis not present

## 2016-07-17 DIAGNOSIS — Z882 Allergy status to sulfonamides status: Secondary | ICD-10-CM | POA: Insufficient documentation

## 2016-07-17 DIAGNOSIS — M25561 Pain in right knee: Secondary | ICD-10-CM | POA: Diagnosis not present

## 2016-07-17 DIAGNOSIS — M4726 Other spondylosis with radiculopathy, lumbar region: Secondary | ICD-10-CM | POA: Diagnosis not present

## 2016-07-17 DIAGNOSIS — M160 Bilateral primary osteoarthritis of hip: Secondary | ICD-10-CM

## 2016-07-17 DIAGNOSIS — Z5181 Encounter for therapeutic drug level monitoring: Secondary | ICD-10-CM | POA: Diagnosis not present

## 2016-07-17 DIAGNOSIS — M47816 Spondylosis without myelopathy or radiculopathy, lumbar region: Secondary | ICD-10-CM

## 2016-07-17 DIAGNOSIS — F17210 Nicotine dependence, cigarettes, uncomplicated: Secondary | ICD-10-CM | POA: Insufficient documentation

## 2016-07-17 DIAGNOSIS — M25562 Pain in left knee: Secondary | ICD-10-CM

## 2016-07-17 DIAGNOSIS — Z79899 Other long term (current) drug therapy: Secondary | ICD-10-CM | POA: Diagnosis not present

## 2016-07-17 DIAGNOSIS — M19012 Primary osteoarthritis, left shoulder: Secondary | ICD-10-CM | POA: Insufficient documentation

## 2016-07-17 DIAGNOSIS — G894 Chronic pain syndrome: Secondary | ICD-10-CM | POA: Diagnosis not present

## 2016-07-17 DIAGNOSIS — G8929 Other chronic pain: Secondary | ICD-10-CM

## 2016-07-17 DIAGNOSIS — M488X7 Other specified spondylopathies, lumbosacral region: Secondary | ICD-10-CM | POA: Diagnosis not present

## 2016-07-17 DIAGNOSIS — Z880 Allergy status to penicillin: Secondary | ICD-10-CM | POA: Diagnosis not present

## 2016-07-17 DIAGNOSIS — M5410 Radiculopathy, site unspecified: Secondary | ICD-10-CM

## 2016-07-17 DIAGNOSIS — M25512 Pain in left shoulder: Secondary | ICD-10-CM

## 2016-07-17 DIAGNOSIS — M170 Bilateral primary osteoarthritis of knee: Secondary | ICD-10-CM

## 2016-07-17 DIAGNOSIS — F1190 Opioid use, unspecified, uncomplicated: Secondary | ICD-10-CM | POA: Diagnosis not present

## 2016-07-17 DIAGNOSIS — F329 Major depressive disorder, single episode, unspecified: Secondary | ICD-10-CM | POA: Insufficient documentation

## 2016-07-17 DIAGNOSIS — M25559 Pain in unspecified hip: Secondary | ICD-10-CM

## 2016-07-17 DIAGNOSIS — M19011 Primary osteoarthritis, right shoulder: Secondary | ICD-10-CM | POA: Insufficient documentation

## 2016-07-17 DIAGNOSIS — J449 Chronic obstructive pulmonary disease, unspecified: Secondary | ICD-10-CM | POA: Diagnosis not present

## 2016-07-17 DIAGNOSIS — M47817 Spondylosis without myelopathy or radiculopathy, lumbosacral region: Secondary | ICD-10-CM | POA: Insufficient documentation

## 2016-07-17 DIAGNOSIS — M549 Dorsalgia, unspecified: Secondary | ICD-10-CM | POA: Diagnosis not present

## 2016-07-17 DIAGNOSIS — Z888 Allergy status to other drugs, medicaments and biological substances status: Secondary | ICD-10-CM | POA: Insufficient documentation

## 2016-07-17 DIAGNOSIS — K219 Gastro-esophageal reflux disease without esophagitis: Secondary | ICD-10-CM | POA: Insufficient documentation

## 2016-07-17 DIAGNOSIS — M25511 Pain in right shoulder: Secondary | ICD-10-CM

## 2016-07-17 DIAGNOSIS — M545 Low back pain: Secondary | ICD-10-CM | POA: Diagnosis present

## 2016-07-17 DIAGNOSIS — Z833 Family history of diabetes mellitus: Secondary | ICD-10-CM | POA: Diagnosis not present

## 2016-07-17 DIAGNOSIS — Z79891 Long term (current) use of opiate analgesic: Secondary | ICD-10-CM

## 2016-07-17 DIAGNOSIS — M1288 Other specific arthropathies, not elsewhere classified, other specified site: Secondary | ICD-10-CM

## 2016-07-17 DIAGNOSIS — M5412 Radiculopathy, cervical region: Secondary | ICD-10-CM | POA: Diagnosis not present

## 2016-07-17 DIAGNOSIS — Z8249 Family history of ischemic heart disease and other diseases of the circulatory system: Secondary | ICD-10-CM | POA: Insufficient documentation

## 2016-07-17 DIAGNOSIS — M79605 Pain in left leg: Secondary | ICD-10-CM

## 2016-07-17 DIAGNOSIS — Z22322 Carrier or suspected carrier of Methicillin resistant Staphylococcus aureus: Secondary | ICD-10-CM

## 2016-07-17 DIAGNOSIS — I10 Essential (primary) hypertension: Secondary | ICD-10-CM | POA: Diagnosis not present

## 2016-07-17 MED ORDER — OXYCODONE HCL 5 MG PO TABS: 5.0000 mg | ORAL_TABLET | Freq: Four times a day (QID) | ORAL | 0 refills | Status: DC | PRN

## 2016-07-17 NOTE — Progress Notes (Signed)
Safety precautions to be maintained throughout the outpatient stay will include: orient to surroundings, keep bed in low position, maintain call bell within reach at all times, provide assistance with transfer out of bed and ambulation.  

## 2016-07-17 NOTE — Progress Notes (Signed)
Nursing Pain Medication Assessment:  Safety precautions to be maintained throughout the outpatient stay will include: orient to surroundings, keep bed in low position, maintain call bell within reach at all times, provide assistance with transfer out of bed and ambulation.  Medication Inspection Compliance: Pill count conducted under aseptic conditions, in front of the patient. Neither the pills nor the bottle was removed from the patient's sight at any time. Once count was completed pills were immediately returned to the patient in their original bottle.  Medication: Oxycodone IR Pill/Patch Count: 73 of 120 pills remain Bottle Appearance: Standard pharmacy container. Clearly labeled. Filled Date: 02 / 16/ 2018 Last Medication intake:  Today

## 2016-07-17 NOTE — Progress Notes (Signed)
Patient's Name: Jennifer Rivers  MRN: [id number]  Referring Provider: Kirk Ruths, MD  DOB: 23-Feb-1946  PCP: Kirk Ruths, MD  DOS: 07/17/2016  Note by: Kathlen Brunswick. Dossie Arbour, MD  Service setting: Ambulatory outpatient  Specialty: Interventional Pain Management  Location: [facility] (AMB) Pain Management Facility    Patient type: Established   Primary Reason(s) for Visit: Encounter for prescription drug management & post-procedure evaluation of chronic illness with mild to moderate exacerbation(Level of risk: moderate) CC: Back Pain (lower)  HPI  Jennifer Rivers is a 71 y.o. year old, female patient, who comes today for a post-procedure evaluation and medication management. She has Adult idiopathic generalized osteoporosis; Atypical migraine; COPD (chronic obstructive pulmonary disease) ([place]); Generalized OA; BP (high blood pressure); Current tear of meniscus; Major depression in remission ([place]); Chronic knee pain (Location of Primary Source of Pain) (Bilateral) (R>L); Long term current use of opiate analgesic; Long term prescription opiate use; Opiate use (20 MME/Day); Encounter for pain management planning; Encounter for therapeutic drug level monitoring; Chronic low back pain (Location of Secondary source of pain) (Bilateral) (L>R); Tricompartmental knee arthropathy (Bilateral); Chronic upper back pain (between shoulder blades) (Bilateral) (R>L); Chronic shoulder pain (Location of Tertiary source of pain) (Bilateral) (L>R); 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 Her primarily concern today is the Back Pain (lower)  Pain Assessment: Self-Reported Pain Score: 6 /10 Clinically the patient looks like a 4/10 Reported level is inconsistent with clinical observations. Information on the proper use of the pain scale provided to the patient today Pain Type: Chronic pain Pain Location: Back Pain Orientation: Lower Pain Descriptors / Indicators: Throbbing, Sharp Pain Frequency: Constant  Jennifer Rivers was last seen on 06/12/2016 for a procedure. During today's appointment we reviewed Jennifer Rivers's post-procedure results, as well as her outpatient medication regimen.  Further details on both, my assessment(s), as well as the proposed treatment plan, please see below.  Controlled Substance Pharmacotherapy Assessment REMS (Risk Evaluation and Mitigation Strategy)  Analgesic:Oxycodone IR 5 mg 1 tablet by mouth every 6 hours (20 mg/dayof oxycodone) MME/day:47m/day. [name]  07/17/2016  2:15 PM  Sign at close encounter Nursing Pain Medication Assessment:  Safety precautions to be maintained throughout the outpatient stay will include: orient to surroundings, keep bed in low position, maintain call bell within reach at all times, provide assistance with transfer out of bed and ambulation.  Medication Inspection Compliance: Pill count conducted under aseptic conditions, in front of the patient. Neither the pills nor the bottle was removed from the patient's sight at any time. Once count was completed pills were immediately returned to the patient in their original bottle.  Medication: Oxycodone IR Pill/Patch Count: 73 of 120 pills remain Bottle Appearance: Standard pharmacy container. Clearly labeled. Filled Date: 02 / 16/ 2018 Last Medication intake:  Today  DLandis Martins RN  07/17/2016  1:14 PM  Sign at close  encounter Safety precautions to be maintained throughout the outpatient stay will include: orient to surroundings, keep bed in low position, maintain call bell within reach at all times, provide assistance  with transfer out of bed and ambulation.    Pharmacokinetics: Liberation and absorption (onset of action): WNL Distribution (time to peak effect): WNL Metabolism and excretion (duration of action): WNL         Pharmacodynamics: Desired effects: Analgesia: Jennifer Rivers reports >50% benefit. Functional ability: Patient reports that medication allows her to accomplish basic ADLs Clinically meaningful improvement in function (CMIF): Sustained CMIF goals met Perceived effectiveness: Described as relatively effective, allowing for increase in activities of daily living (ADL) Undesirable effects: Side-effects or Adverse reactions: None reported Monitoring: [place] PMP: Online review of the past 32-monthperiod conducted. Compliant with practice rules and regulations List of all UDS test(s) done:  Lab Results  Component Value Date   TOXASSSELUR FINAL 04/06/2016   SUMMARY FINAL 12/28/2015   Last UDS on record: ToxAssure Select 13  Date Value Ref Range Status  04/06/2016 FINAL  Final    Comment:    ==================================================================== TOXASSURE SELECT 13 (MW) ==================================================================== Test                             Result       Flag       Units Drug Present and Declared for Prescription Verification   Oxycodone                      821          EXPECTED   ng/mg creat   Oxymorphone                    1518         EXPECTED   ng/mg creat   Noroxycodone                   1696         EXPECTED   ng/mg creat   Noroxymorphone                 296          EXPECTED   ng/mg creat    Sources of oxycodone are scheduled prescription medications.    Oxymorphone, noroxycodone, and noroxymorphone are expected    metabolites of  oxycodone. Oxymorphone is also available as a    scheduled prescription medication. ==================================================================== Test                      Result    Flag   Units      Ref Range   Creatinine              28               mg/dL      >=20 ==================================================================== Declared Medications:  The flagging and interpretation on this report are based on the  following declared medications.  Unexpected results may arise from  inaccuracies in the declared medications.  **Note: The testing scope of this panel includes these medications:  Oxycodone  **Note: The testing scope of this panel does not include following  reported medications:  Albuterol  Carvedilol  Cholecalciferol  Cyanocobalamin  Omeprazole ==================================================================== For clinical consultation, please call ([phone] ====================================================================    UDS interpretation: Compliant          Medication Assessment Form: Reviewed. Patient indicates being compliant with therapy Treatment compliance: Compliant Risk Assessment Profile: Aberrant behavior: See prior evaluations. None observed or detected today Comorbid factors increasing risk of overdose:  See prior notes. No additional risks detected today Risk of substance use disorder (SUD): Low Opioid Risk Tool (ORT) Total Score:    Interpretation Table:  Score <3 = Low Risk for SUD  Score between 4-7 = Moderate Risk for SUD  Score >8 = High Risk for Opioid Abuse   Risk Mitigation Strategies:  Patient Counseling: Covered Patient-Prescriber Agreement (PPA): Present and active  Notification to other healthcare providers: Done  Pharmacologic Plan: No change in therapy, at this time  Post-Procedure Assessment  06/12/2016 Procedure: Diagnostic Left CESI #1 under fluoro and IV sedation. Post-procedure pain score: 0/10  (100% relief) Influential Factors: BMI: 27.44 kg/m Intra-procedural challenges: None observed Assessment challenges: Results reported today are inconsistent with those reported on procedure day, immediately before discharge. Previously the patient had reported 100% relief of the pain, before leaving the facility Post-procedural side-effects, adverse reactions, or complications: None reported Reported issues: None  Sedation: Sedation provided. When no sedatives are used, the analgesic levels obtained are directly associated to the effectiveness of the local anesthetics. However, when sedation is provided, the level of analgesia obtained during the initial 1 hour following the intervention, is believed to be the result of a combination of factors. These factors may include, but are not limited to: 1. The effectiveness of the local anesthetics used. 2. The effects of the analgesic(s) and/or anxiolytic(s) used. 3. The degree of discomfort experienced by the patient at the time of the procedure. 4. The patients ability and reliability in recalling and recording the events. 5. The presence and influence of possible secondary gains and/or psychosocial factors. Reported result: Relief experienced during the 1st hour after the procedure: 100 % (Ultra-Short Term Relief) Interpretative annotation: Analgesia during this period is likely to be Local Anesthetic and/or IV Sedative (Analgesic/Anxiolitic) related.          Effects of local anesthetic: The analgesic effects attained during this period are directly associated to the localized infiltration of local anesthetics and therefore cary significant diagnostic value as to the etiological location, or anatomical origin, of the pain. Expected duration of relief is directly dependent on the pharmacodynamics of the local anesthetic used. Long-acting (4-6 hours) anesthetics used.  Reported result: Relief during the next 4 to 6 hour after the procedure: 100 %  (Short-Term Relief) Interpretative annotation: Complete relief would suggest area to be the source of the pain.          Long-term benefit: Defined as the period of time past the expected duration of local anesthetics. With the possible exception of prolonged sympathetic blockade from the local anesthetics, benefits during this period are typically attributed to, or associated with, other factors such as analgesic sensory neuropraxia, antiinflammatory effects, or beneficial biochemical changes provided by agents other than the local anesthetics Reported result: Extended relief following procedure: 25 % (Long-Term Relief) Interpretative annotation: Good relief. This could suggest inflammation to be a significant component in the etiology to the pain.          Current benefits: Defined as persistent relief that continues at this point in time.   Reported results: Treated area: 50 % Ms. Ertel reports improvement in function Interpretative annotation: Ongoing benefits would suggest effective therapeutic approach  Interpretation: Results would suggest a successful diagnostic intervention.          Laboratory Chemistry  Inflammation Markers Lab Results  Component Value Date   ESRSEDRATE 55 (H) 12/28/2015   CRP 3.0 (H) 12/28/2015   Renal Function Lab Results  Component Value Date  BUN 18 12/28/2015   CREATININE 1.15 (H) 12/28/2015   GFRAA 56 (L) 12/28/2015   GFRNONAA 48 (L) 12/28/2015   Hepatic Function Lab Results  Component Value Date   AST 24 12/28/2015   ALT 17 12/28/2015   ALBUMIN 4.2 12/28/2015   Electrolytes Lab Results  Component Value Date   NA 134 (L) 12/28/2015   K 4.3 12/28/2015   CL 99 (L) 12/28/2015   CALCIUM 9.8 12/28/2015   MG 1.9 12/28/2015   Pain Modulating Vitamins Lab Results  Component Value Date   25OHVITD1 24 (L) 12/28/2015   25OHVITD2 2.5 12/28/2015   25OHVITD3 21 12/28/2015   VITAMINB12 176 (L) 12/28/2015   Coagulation Parameters Lab Results   Component Value Date   PLT 444 (H) 09/06/2015   Cardiovascular Lab Results  Component Value Date   HGB 9.5 (L) 09/06/2015   HCT 29.5 (L) 09/06/2015   Note: Lab results reviewed.  Recent Diagnostic Imaging Review  No results found. Note: Imaging results reviewed.          Meds  The patient has a current medication list which includes the following prescription(s): albuterol, carvedilol, cholecalciferol, cyanocobalamin, cyclobenzaprine, omeprazole, oxycodone, oxycodone, oxycodone, and trazodone.  Current Outpatient Prescriptions on File Prior to Visit  Medication Sig  . albuterol (VENTOLIN HFA) 108 (90 BASE) MCG/ACT inhaler Take 2 puffs by mouth every 6 (six) hours as needed.  . carvedilol (COREG) 12.5 MG tablet 12.5 mg 2 (two) times daily.   . Cholecalciferol (VITAMIN D-1000 MAX ST) 1000 units tablet Take by mouth daily.   . Cyanocobalamin (RA VITAMIN B-12 TR) 1000 MCG TBCR Take 1,000 mcg by mouth daily.   . cyclobenzaprine (FLEXERIL) 5 MG tablet Take 1 tablet (5 mg total) by mouth 2 (two) times daily as needed for muscle spasms. Do not take with other pain medications. Do not drive or operate machinery while taking as can cause drowsiness.  Marland Rivers omeprazole (PRILOSEC) 20 MG capsule Take 20 mg by mouth daily.    No current facility-administered medications on file prior to visit.    ROS  Constitutional: Denies any fever or chills Gastrointestinal: No reported hemesis, hematochezia, vomiting, or acute GI distress Musculoskeletal: Denies any acute onset joint swelling, redness, loss of ROM, or weakness Neurological: No reported episodes of acute onset apraxia, aphasia, dysarthria, agnosia, amnesia, paralysis, loss of coordination, or loss of consciousness  Allergies  Ms. Mamaril is allergic to bupropion; codeine; gabapentin; tramadol; penicillins; and sulfa antibiotics.  [place]  Drug: Ms. Stroope  reports that she does not use drugs. Alcohol:  reports that she does not drink  alcohol. Tobacco:  reports that she has been smoking Cigarettes.  She has been smoking about 0.50 packs per day. She has never used smokeless tobacco. Medical:  has a past medical history of Asthma; GERD (gastroesophageal reflux disease); Hypertension; MRSA (methicillin resistant staph aureus) culture positive (08/2015); Multilevel degenerative disc disease; Osteoarthritis; and Osteoporosis. Family: family history includes Diabetes in her mother; Heart attack in her father.  Past Surgical History:  Procedure Laterality Date  . ABDOMINAL HYSTERECTOMY    . KNEE ARTHROSCOPY Right 09/15/2015   Procedure: ARTHROSCOPY KNEE partial medial and lateral menisectomy, chondroplasty;  Surgeon: Leanor Kail, MD;  Location: [facility] ORS;  Service: Orthopedics;  Laterality: Right;  . TONSILLECTOMY     Constitutional Exam  General appearance: Well nourished, well developed, and well hydrated. In no apparent acute distress Vitals:   07/17/16 1309  BP: (!) 161/75  Pulse: (!) 111  Resp: 14  Temp: 98 F (36.7 C)  TempSrc: Oral  SpO2: 99%  Weight: 150 lb (68 kg)  Height: 5' 2"  (1.575 m)   BMI Assessment: Estimated body mass index is 27.44 kg/m as calculated from the following:   Height as of this encounter: 5' 2"  (1.575 m).   Weight as of this encounter: 150 lb (68 kg).  BMI interpretation table: BMI level Category Range association with higher incidence of chronic pain  <18 kg/m2 Underweight   18.5-24.9 kg/m2 Ideal body weight   25-29.9 kg/m2 Overweight Increased incidence by 20%  30-34.9 kg/m2 Obese (Class I) Increased incidence by 68%  35-39.9 kg/m2 Severe obesity (Class II) Increased incidence by 136%  >40 kg/m2 Extreme obesity (Class III) Increased incidence by 254%   BMI Readings from Last 4 Encounters:  07/17/16 27.44 kg/m  07/12/16 27.25 kg/m  06/12/16 27.44 kg/m  05/04/16 27.44 kg/m   Wt Readings from Last 4 Encounters:  07/17/16 150 lb (68 kg)  07/12/16 149 lb (67.6 kg)   06/12/16 150 lb (68 kg)  05/04/16 150 lb (68 kg)  Psych/Mental status: Alert, oriented x 3 (person, place, & time)       Eyes: PERLA Respiratory: No evidence of acute respiratory distress  Cervical Spine Exam  Inspection: No masses, redness, or swelling Alignment: Symmetrical Functional ROM: Unrestricted ROM Stability: No instability detected Muscle strength & Tone: Functionally intact Sensory: Unimpaired Palpation: Non-contributory  Upper Extremity (UE) Exam    Side: Right upper extremity  Side: Left upper extremity  Inspection: No masses, redness, swelling, or asymmetry. No contractures  Inspection: No masses, redness, swelling, or asymmetry. No contractures  Functional ROM: Unrestricted ROM          Functional ROM: Unrestricted ROM          Muscle strength & Tone: Functionally intact  Muscle strength & Tone: Functionally intact  Sensory: Unimpaired  Sensory: Unimpaired  Palpation: Euthermic  Palpation: Euthermic  Specialized Test(s): Deferred         Specialized Test(s): Deferred          Thoracic Spine Exam  Inspection: No masses, redness, or swelling Alignment: Symmetrical Functional ROM: Unrestricted ROM Stability: No instability detected Sensory: Unimpaired Muscle strength & Tone: Functionally intact Palpation: Non-contributory  Lumbar Spine Exam  Inspection: No masses, redness, or swelling Alignment: Symmetrical Functional ROM: Unrestricted ROM Stability: No instability detected Muscle strength & Tone: Functionally intact Sensory: Unimpaired Palpation: Non-contributory Provocative Tests: Lumbar Hyperextension and rotation test: evaluation deferred today       Patrick's Maneuver: evaluation deferred today              Gait & Posture Assessment  Ambulation: Unassisted Gait: Relatively normal for age and body habitus Posture: WNL   Lower Extremity Exam    Side: Right lower extremity  Side: Left lower extremity  Inspection: No masses, redness, swelling, or  asymmetry. No contractures  Inspection: No masses, redness, swelling, or asymmetry. No contractures  Functional ROM: Unrestricted ROM          Functional ROM: Unrestricted ROM          Muscle strength & Tone: Functionally intact  Muscle strength & Tone: Functionally intact  Sensory: Unimpaired  Sensory: Unimpaired  Palpation: No palpable anomalies  Palpation: No palpable anomalies   Assessment  Primary Diagnosis & Pertinent Problem List: The primary encounter diagnosis was Chronic pain syndrome. Diagnoses of Chronic cervical radicular pain (C4/C5 dermatomes) (L), Chronic upper back pain (between shoulder blades) (Bilateral) (R>L), Chronic shoulder pain (  Location of Tertiary source of pain) (Bilateral) (L>R), Long term current use of opiate analgesic, Opiate use (20 MME/Day), MRSA (methicillin resistant staph aureus) culture positive, Osteoarthritis of knees (Bilateral), Chronic knee pain (Location of Primary Source of Pain) (Bilateral) (R>L), Chronic low back pain (Location of Secondary source of pain) (Bilateral) (L>R), Chronic lower extremity radicular pain (Bilateral) (R>L), Chronic lower extremity pain (Bilateral) (R>L), Hip pain, chronic, unspecified laterality (B) (R>L), Primary osteoarthritis of hips, bilateral, Spondylosis of lumbar spine, and Lumbosacral facet joint syndrome were also pertinent to this visit.  Status Diagnosis  Controlled Improving Improving 1. Chronic pain syndrome   2. Chronic cervical radicular pain (C4/C5 dermatomes) (L)   3. Chronic upper back pain (between shoulder blades) (Bilateral) (R>L)   4. Chronic shoulder pain (Location of Tertiary source of pain) (Bilateral) (L>R)   5. Long term current use of opiate analgesic   6. Opiate use (20 MME/Day)   7. MRSA (methicillin resistant staph aureus) culture positive   8. Osteoarthritis of knees (Bilateral)   9. Chronic knee pain (Location of Primary Source of Pain) (Bilateral) (R>L)   10. Chronic low back pain (Location  of Secondary source of pain) (Bilateral) (L>R)   11. Chronic lower extremity radicular pain (Bilateral) (R>L)   12. Chronic lower extremity pain (Bilateral) (R>L)   13. Hip pain, chronic, unspecified laterality (B) (R>L)   14. Primary osteoarthritis of hips, bilateral   15. Spondylosis of lumbar spine   16. Lumbosacral facet joint syndrome      Plan of Care  Pharmacotherapy (Medications Ordered): Meds ordered this encounter  Medications  . oxyCODONE (OXY IR/ROXICODONE) 5 MG immediate release tablet    Sig: Take 1 tablet (5 mg total) by mouth every 6 (six) hours as needed for severe pain.    Dispense:  120 tablet    Refill:  0    Do not place this medication, or any other prescription from our practice, on "Automatic Refill". Patient may have prescription filled one day early if pharmacy is closed on scheduled refill date. Do not fill until: 08/06/16 To last until: 09/05/16  . oxyCODONE (OXY IR/ROXICODONE) 5 MG immediate release tablet    Sig: Take 1 tablet (5 mg total) by mouth every 6 (six) hours as needed for severe pain.    Dispense:  120 tablet    Refill:  0    Do not place this medication, or any other prescription from our practice, on "Automatic Refill". Patient may have prescription filled one day early if pharmacy is closed on scheduled refill date. Do not fill until: 09/05/16 To last until: 10/05/16  . oxyCODONE (OXY IR/ROXICODONE) 5 MG immediate release tablet    Sig: Take 1 tablet (5 mg total) by mouth every 6 (six) hours as needed for severe pain.    Dispense:  120 tablet    Refill:  0    Do not place this medication, or any other prescription from our practice, on "Automatic Refill". Patient may have prescription filled one day early if pharmacy is closed on scheduled refill date. Do not fill until: 10/05/16 To last until: 11/04/16   New Prescriptions   No medications on file   Medications administered today: Ms. Cantrell had no medications administered during this  visit. Lab-work, procedure(s), and/or referral(s): Orders Placed This Encounter  Procedures  . Cervical Epidural Injection  . KNEE INJECTION  . LUMBAR FACET(MEDIAL BRANCH NERVE BLOCK) MBNB  . DG HIP UNILAT W OR W/O PELVIS 2-3 VIEWS LEFT  .  DG HIP UNILAT W OR W/O PELVIS 2-3 VIEWS RIGHT  . MR LUMBAR SPINE WO CONTRAST   Imaging and/or referral(s): DG HIP UNILAT W OR W/O PELVIS 2-3 VIEWS LEFT DG HIP UNILAT W OR W/O PELVIS 2-3 VIEWS RIGHT MR LUMBAR SPINE WO CONTRAST  Interventional therapies: Planned, scheduled, and/or pending:   Diagnostic bilateral Lumbar facet block   Considering:   Left cervical epidural steroid injection #2 Diagnostic bilateral intra-articular knee injection Possible series of 5 bilateral Hyalgan intra-articular knee injections Diagnostic bilateral genicular nerve blocks  Possible bilateral genicular nerve radiofrequency ablation Diagnostic bilateral lumbar facet block  Possible bilateral lumbar facet radiofrequency ablation  Diagnostic bilateral intra-articular shoulder joint injection  Diagnostic bilateral suprascapular nerve block  Possible bilateral suprascapular nerve radiofrequency ablation  Diagnostic right-sided L5-S1 transforaminal epidural steroid injection Diagnostic left sided S1 transforaminal epidural steroid injection Diagnostic right-sided L4-5 lumbar epidural steroid injection    Palliative PRN treatment(s):   Palliative bilateral intra-articular knee injection with local anesthetic and steroid. Last one done on 04/17/2016 with 100% short-term and 75% long-term relief of the pain. Therapeutic series of 5 intra-articular Hyalgan knee injections, bilaterally.  Left cervical epidural steroid injection #2   Provider-requested follow-up: Return in about 3 months (around 10/14/2016) for (MD) Med-Mgmt, in addition, procedure (ASAA).  Future Appointments Date Time Provider [place]  07/28/2016 11:00 AM [facility] 1 [facility]  09/28/2016  8:15 AM Milinda Pointer, MD [facility] None   Primary Care Physician: Kirk Ruths, MD Location: [facility] Outpatient Pain Management Facility Note by: Kathlen Brunswick. Dossie Arbour, M.D, DABA, DABAPM, DABPM, DABIPP, FIPP Date: 07/17/2016; Time: 3:16 PM  Pain Score Disclaimer: We use the NRS-11 scale. This is a self-reported, subjective measurement of pain severity with only modest accuracy. It is used primarily to identify changes within a particular patient. It must be understood that outpatient pain scales are significantly less accurate that those used for research, where they can be applied under ideal controlled circumstances with minimal exposure to variables. In reality, the score is likely to be a combination of pain intensity and pain affect, where pain affect describes the degree of emotional arousal or changes in action readiness caused by the sensory experience of pain. Factors such as social and work situation, setting, emotional state, anxiety levels, expectation, and prior pain experience may influence pain perception and show large inter-individual differences that may also be affected by time variables.  Patient instructions provided during this appointment: Patient Instructions    You have been given 3 prescriptions for oxycodone today.  You were instructed to get [facility]work [facility] mall.    Pain Score  Introduction: The pain score used by this practice is the Verbal Numerical Rating Scale (VNRS-11). This is an 11-point scale. It is for adults and children 10 years or older. There are significant differences in how the pain score is reported, used, and applied. Forget everything you learned in the past and learn this scoring system.  General Information: The scale should reflect your current level of pain. Unless you are specifically asked for the level of your worst pain, or your average pain. If you are asked for one of these two, then it should be understood that  it is over the past 24 hours.  Basic Activities of Daily Living (ADL): Personal hygiene, dressing, eating, transferring, and using restroom.  Instructions: Most patients tend to report their level of pain as a combination of two factors, their physical pain and their psychosocial pain. This last  one is also known as "suffering" and it is reflection of how physical pain affects you socially and psychologically. From now on, report them separately. From this point on, when asked to report your pain level, report only your physical pain. Use the following table for reference.  Pain Clinic Pain Levels (0-5/10)  Pain Level Score Description  No Pain 0   Mild pain 1 Nagging, annoying, but does not interfere with basic activities of daily living (ADL). Patients are able to eat, bathe, get dressed, toileting (being able to get on and off the toilet and perform personal hygiene functions), transfer (move in and out of bed or a chair without assistance), and maintain continence (able to control bladder and bowel functions). Blood pressure and heart rate are unaffected. A normal heart rate for a healthy adult ranges from 60 to 100 bpm (beats per minute).   Mild to moderate pain 2 Noticeable and distracting. Impossible to hide from other people. More frequent flare-ups. Still possible to adapt and function close to normal. It can be very annoying and may have occasional stronger flare-ups. With discipline, patients may get used to it and adapt.   Moderate pain 3 Interferes significantly with activities of daily living (ADL). It becomes difficult to feed, bathe, get dressed, get on and off the toilet or to perform personal hygiene functions. Difficult to get in and out of bed or a chair without assistance. Very distracting. With effort, it can be ignored when deeply involved in activities.   Moderately severe pain 4 Impossible to ignore for more than a few minutes. With effort, patients may still be able to manage  work or participate in some social activities. Very difficult to concentrate. Signs of autonomic nervous system discharge are evident: dilated pupils (mydriasis); mild sweating (diaphoresis); sleep interference. Heart rate becomes elevated (>115 bpm). Diastolic blood pressure (lower number) rises above 100 mmHg. Patients find relief in laying down and not moving.   Severe pain 5 Intense and extremely unpleasant. Associated with frowning face and frequent crying. Pain overwhelms the senses.  Ability to do any activity or maintain social relationships becomes significantly limited. Conversation becomes difficult. Pacing back and forth is common, as getting into a comfortable position is nearly impossible. Pain wakes you up from deep sleep. Physical signs will be obvious: pupillary dilation; increased sweating; goosebumps; brisk reflexes; cold, clammy hands and feet; nausea, vomiting or dry heaves; loss of appetite; significant sleep disturbance with inability to fall asleep or to remain asleep. When persistent, significant weight loss is observed due to the complete loss of appetite and sleep deprivation.  Blood pressure and heart rate becomes significantly elevated. Caution: If elevated blood pressure triggers a pounding headache associated with blurred vision, then the patient should immediately seek attention at an urgent or emergency care unit, as these may be signs of an impending stroke.    Emergency Department Pain Levels (6-10/10)  Emergency Room Pain 6 Severely limiting. Requires emergency care and should not be seen or managed at an outpatient pain management facility. Communication becomes difficult and requires great effort. Assistance to reach the emergency department may be required. Facial flushing and profuse sweating along with potentially dangerous increases in heart rate and blood pressure will be evident.   Distressing pain 7 Self-care is very difficult. Assistance is required to  transport, or use restroom. Assistance to reach the emergency department will be required. Tasks requiring coordination, such as bathing and getting dressed become very difficult.   Disabling pain 8  Self-care is no longer possible. At this level, pain is disabling. The individual is unable to do even the most "basic" activities such as walking, eating, bathing, dressing, transferring to a bed, or toileting. Fine motor skills are lost. It is difficult to think clearly.   Incapacitating pain 9 Pain becomes incapacitating. Thought processing is no longer possible. Difficult to remember your own name. Control of movement and coordination are lost.   The worst pain imaginable 10 At this level, most patients pass out from pain. When this level is reached, collapse of the autonomic nervous system occurs, leading to a sudden drop in blood pressure and heart rate. This in turn results in a temporary and dramatic drop in blood flow to the brain, leading to a loss of consciousness. Fainting is one of the body's self defense mechanisms. Passing out puts the brain in a calmed state and causes it to shut down for a while, in order to begin the healing process.    Summary: 1. Refer to this scale when providing [place] with your pain level. 2. Be accurate and careful when reporting your pain level. This will help with your care. 3. Over-reporting your pain level will lead to loss of credibility. 4. Even a level of 1/10 means that there is pain and will be treated at our facility. 5. High, inaccurate reporting will be documented as "Symptom Exaggeration", leading to loss of credibility and suspicions of possible secondary gains such as obtaining more narcotics, or wanting to appear disabled, for fraudulent reasons. 6. Only pain levels of 5 or below will be seen at our facility. 7. Pain levels of 6 and above will be sent to the Emergency Department and the appointment  cancelled. _____________________________________________________________________________________________  Preparing for Procedure with Sedation Instructions: . Oral Intake: Do not eat or drink anything for at least 8 hours prior to your procedure. . Transportation: Public transportation is not allowed. Bring an adult driver. The driver must be physically present in our waiting room before any procedure can be started. [place] Physical Assistance: Bring an adult capable of physically assisting you, in the event you need help. . Blood Pressure Medicine: Take your blood pressure medicine with a sip of water the morning of the procedure. . Insulin: Take only  of your normal insulin dose. . Preventing infections: Shower with an antibacterial soap the morning of your procedure. . Build-up your immune system: Take 1000 mg of Vitamin C with every meal (3 times a day) the day prior to your procedure. . Pregnancy: If you are pregnant, call and cancel the procedure. . Sickness: If you have a cold, fever, or any active infections, call and cancel the procedure. . Arrival: You must be in the facility at least 30 minutes prior to your scheduled procedure. . Children: Do not bring children with you. . Dress appropriately: Bring dark clothing that you would not mind if they get stained. . Valuables: Do not bring any jewelry or valuables. Procedure appointments are reserved for interventional treatments only. [place] No Prescription Refills. . No medication changes will be discussed during procedure appointments. No disability issues will be discussed.Epidural Steroid Injection Patient Information  Description: The epidural space surrounds the nerves as they exit the spinal cord.  In some patients, the nerves can be compressed and inflamed by a bulging disc or a tight spinal canal (spinal stenosis).  By injecting steroids into the epidural space, we can bring irritated nerves into direct contact with a potentially  helpful medication.  These  steroids act directly on the irritated nerves and can reduce swelling and inflammation which often leads to decreased pain.  Epidural steroids may be injected anywhere along the spine and from the neck to the low back depending upon the location of your pain.   After numbing the skin with local anesthetic (like Novocaine), a small needle is passed into the epidural space slowly.  You may experience a sensation of pressure while this is being done.  The entire block usually last less than 10 minutes.  Conditions which may be treated by epidural steroids:  Low back and leg pain Neck and arm pain Spinal stenosis Post-laminectomy syndrome Herpes zoster (shingles) pain Pain from compression fractures  Preparation for the injection:  Do not eat any solid food or dairy products within 8 hours of your appointment.  You may drink clear liquids up to 3 hours before appointment.  Clear liquids include water, black coffee, juice or soda.  No milk or cream please. You may take your regular medication, including pain medications, with a sip of water before your appointment  Diabetics should hold regular insulin (if taken separately) and take 1/2 normal NPH dos the morning of the procedure.  Carry some sugar containing items with you to your appointment. A driver must accompany you and be prepared to drive you home after your procedure.  Bring all your current medications with your. An IV may be inserted and sedation may be given at the discretion of the physician.   A blood pressure cuff, EKG and other monitors will often be applied during the procedure.  Some patients may need to have extra oxygen administered for a short period. You will be asked to provide medical information, including your allergies, prior to the procedure.  We must know immediately if you are taking blood thinners (like Coumadin/Warfarin)  Or if you are allergic to IV iodine contrast (dye). We must know if you  could possible be pregnant.  Possible side-effects: Bleeding from needle site Infection (rare, may require surgery) Nerve injury (rare) Numbness & tingling (temporary) Difficulty urinating (rare, temporary) Spinal headache ( a headache worse with upright posture) Light -headedness (temporary) Pain at injection site (several days) Decreased blood pressure (temporary) Weakness in arm/leg (temporary) Pressure sensation in back/neck (temporary)  Call if you experience: Fever/chills associated with headache or increased back/neck pain. Headache worsened by an upright position. New onset weakness or numbness of an extremity below the injection site Hives or difficulty breathing (go to the emergency room) Inflammation or drainage at the infection site Severe back/neck pain Any new symptoms which are concerning to you  Please note:  Although the local anesthetic injected can often make your back or neck feel good for several hours after the injection, the pain will likely return.  It takes 3-7 days for steroids to work in the epidural space.  You may not notice any pain relief for at least that one week.  If effective, we will often do a series of three injections spaced 3-6 weeks apart to maximally decrease your pain.  After the initial series, we generally will wait several months before considering a repeat injection of the same type.  If you have any questions, please call [phone] [facility] Patient Information  Description: The facets are joints in the spine between the vertebrae.  Like any joints in the body, facets can become irritated and painful.  Arthritis can also effect the facets.  By injecting steroids and  local anesthetic in and around these joints, we can temporarily block the nerve supply to them.  Steroids act directly on irritated nerves and tissues to reduce selling and inflammation which often leads to decreased pain.   Facet blocks may be done anywhere along the spine from the neck to the low back depending upon the location of your pain.   After numbing the skin with local anesthetic (like Novocaine), a small needle is passed onto the facet joints under x-ray guidance.  You may experience a sensation of pressure while this is being done.  The entire block usually lasts about 15-25 minutes.   Conditions which may be treated by facet blocks:  Low back/buttock pain Neck/shoulder pain Certain types of headaches  Preparation for the injection:  Do not eat any solid food or dairy products within 8 hours of your appointment. You may drink clear liquid up to 3 hours before appointment.  Clear liquids include water, black coffee, juice or soda.  No milk or cream please. You may take your regular medication, including pain medications, with a sip of water before your appointment.  Diabetics should hold regular insulin (if taken separately) and take 1/2 normal NPH dose the morning of the procedure.  Carry some sugar containing items with you to your appointment. A driver must accompany you and be prepared to drive you home after your procedure. Bring all your current medications with you. An IV may be inserted and sedation may be given at the discretion of the physician. A blood pressure cuff, EKG and other monitors will often be applied during the procedure.  Some patients may need to have extra oxygen administered for a short period. You will be asked to provide medical information, including your allergies and medications, prior to the procedure.  We must know immediately if you are taking blood thinners (like Coumadin/Warfarin) or if you are allergic to IV iodine contrast (dye).  We must know if you could possible be pregnant.  Possible side-effects:  Bleeding from needle site Infection (rare, may require surgery) Nerve injury (rare) Numbness & tingling (temporary) Difficulty urinating (rare,  temporary) Spinal headache (a headache worse with upright posture) Light-headedness (temporary) Pain at injection site (serveral days) Decreased blood pressure (rare, temporary) Weakness in arm/leg (temporary) Pressure sensation in back/neck (temporary)   Call if you experience:  Fever/chills associated with headache or increased back/neck pain Headache worsened by an upright position New onset, weakness or numbness of an extremity below the injection site Hives or difficulty breathing (go to the emergency room) Inflammation or drainage at the injection site(s) Severe back/neck pain greater than usual New symptoms which are concerning to you  Please note:  Although the local anesthetic injected can often make your back or neck feel good for several hours after the injection, the pain will likely return. It takes 3-7 days for steroids to work.  You may not notice any pain relief for at least one week.  If effective, we will often do a series of 2-3 injections spaced 3-6 weeks apart to maximally decrease your pain.  After the initial series, you may be a candidate for a more permanent nerve block of the facets.  If you have any questions, please call #[phone] . [facility]

## 2016-07-17 NOTE — Patient Instructions (Addendum)
You have been given 3 prescriptions for oxycodone today.  You were instructed [facility].    Pain Score  Introduction: The pain score used by this practice is the Verbal Numerical Rating Scale (VNRS-11). This is an 11-point scale. It is for adults and children 10 years or older. There are significant differences in how the pain score is reported, used, and applied. Forget everything you learned in the past and learn this scoring system.  General Information: The scale should reflect your current level of pain. Unless you are specifically asked for the level of your worst pain, or your average pain. If you are asked for one of these two, then it should be understood that it is over the past 24 hours.  Basic Activities of Daily Living (ADL): Personal hygiene, dressing, eating, transferring, and using restroom.  Instructions: Most patients tend to report their level of pain as a combination of two factors, their physical pain and their psychosocial pain. This last one is also known as "suffering" and it is reflection of how physical pain affects you socially and psychologically. From now on, report them separately. From this point on, when asked to report your pain level, report only your physical pain. Use the following table for reference.  Pain Clinic Pain Levels (0-5/10)  Pain Level Score Description  No Pain 0   Mild pain 1 Nagging, annoying, but does not interfere with basic activities of daily living (ADL). Patients are able to eat, bathe, get dressed, toileting (being able to get on and off the toilet and perform personal hygiene functions), transfer (move in and out of bed or a chair without assistance), and maintain continence (able to control bladder and bowel functions). Blood pressure and heart rate are unaffected. A normal heart rate for a healthy adult ranges from 60 to 100 bpm (beats per minute).   Mild to moderate pain 2 Noticeable and  distracting. Impossible to hide from other people. More frequent flare-ups. Still possible to adapt and function close to normal. It can be very annoying and may have occasional stronger flare-ups. With discipline, patients may get used to it and adapt.   Moderate pain 3 Interferes significantly with activities of daily living (ADL). It becomes difficult to feed, bathe, get dressed, get on and off the toilet or to perform personal hygiene functions. Difficult to get in and out of bed or a chair without assistance. Very distracting. With effort, it can be ignored when deeply involved in activities.   Moderately severe pain 4 Impossible to ignore for more than a few minutes. With effort, patients may still be able to manage work or participate in some social activities. Very difficult to concentrate. Signs of autonomic nervous system discharge are evident: dilated pupils (mydriasis); mild sweating (diaphoresis); sleep interference. Heart rate becomes elevated (>115 bpm). Diastolic blood pressure (lower number) rises above 100 mmHg. Patients find relief in laying down and not moving.   Severe pain 5 Intense and extremely unpleasant. Associated with frowning face and frequent crying. Pain overwhelms the senses.  Ability to do any activity or maintain social relationships becomes significantly limited. Conversation becomes difficult. Pacing back and forth is common, as getting into a comfortable position is nearly impossible. Pain wakes you up from deep sleep. Physical signs will be obvious: pupillary dilation; increased sweating; goosebumps; brisk reflexes; cold, clammy hands and feet; nausea, vomiting or dry heaves; loss of appetite; significant sleep disturbance with inability to fall asleep or  to remain asleep. When persistent, significant weight loss is observed due to the complete loss of appetite and sleep deprivation.  Blood pressure and heart rate becomes significantly elevated. Caution: If elevated  blood pressure triggers a pounding headache associated with blurred vision, then the patient should immediately seek attention at an urgent or emergency care unit, as these may be signs of an impending stroke.    Emergency Department Pain Levels (6-10/10)  Emergency Room Pain 6 Severely limiting. Requires emergency care and should not be seen or managed at an outpatient pain management facility. Communication becomes difficult and requires great effort. Assistance to reach the emergency department may be required. Facial flushing and profuse sweating along with potentially dangerous increases in heart rate and blood pressure will be evident.   Distressing pain 7 Self-care is very difficult. Assistance is required to transport, or use restroom. Assistance to reach the emergency department will be required. Tasks requiring coordination, such as bathing and getting dressed become very difficult.   Disabling pain 8 Self-care is no longer possible. At this level, pain is disabling. The individual is unable to do even the most "basic" activities such as walking, eating, bathing, dressing, transferring to a bed, or toileting. Fine motor skills are lost. It is difficult to think clearly.   Incapacitating pain 9 Pain becomes incapacitating. Thought processing is no longer possible. Difficult to remember your own name. Control of movement and coordination are lost.   The worst pain imaginable 10 At this level, most patients pass out from pain. When this level is reached, collapse of the autonomic nervous system occurs, leading to a sudden drop in blood pressure and heart rate. This in turn results in a temporary and dramatic drop in blood flow to the brain, leading to a loss of consciousness. Fainting is one of the body's self defense mechanisms. Passing out puts the brain in a calmed state and causes it to shut down for a while, in order to begin the healing process.    Summary: 1. Refer to this scale when  providing [place] with your pain level. 2. Be accurate and careful when reporting your pain level. This will help with your care. 3. Over-reporting your pain level will lead to loss of credibility. 4. Even a level of 1/10 means that there is pain and will be treated at our facility. 5. High, inaccurate reporting will be documented as "Symptom Exaggeration", leading to loss of credibility and suspicions of possible secondary gains such as obtaining more narcotics, or wanting to appear disabled, for fraudulent reasons. 6. Only pain levels of 5 or below will be seen at our facility. 7. Pain levels of 6 and above will be sent to the Emergency Department and the appointment cancelled. _____________________________________________________________________________________________  Preparing for Procedure with Sedation Instructions: . Oral Intake: Do not eat or drink anything for at least 8 hours prior to your procedure. . Transportation: Public transportation is not allowed. Bring an adult driver. The driver must be physically present in our waiting room before any procedure can be started. Marland Kitchen Physical Assistance: Bring an adult capable of physically assisting you, in the event you need help. . Blood Pressure Medicine: Take your blood pressure medicine with a sip of water the morning of the procedure. . Insulin: Take only  of your normal insulin dose. . Preventing infections: Shower with an antibacterial soap the morning of your procedure. . Build-up your immune system: Take 1000 mg of Vitamin C with every meal (3 times a day)  the day prior to your procedure. . Pregnancy: If you are pregnant, call and cancel the procedure. . Sickness: If you have a cold, fever, or any active infections, call and cancel the procedure. . Arrival: You must be in the facility at least 30 minutes prior to your scheduled procedure. . Children: Do not bring children with you. . Dress appropriately: Bring dark clothing that you  would not mind if they get stained. . Valuables: Do not bring any jewelry or valuables. Procedure appointments are reserved for interventional treatments only. Marland Kitchen No Prescription Refills. . No medication changes will be discussed during procedure appointments. No disability issues will be discussed.Epidural Steroid Injection Patient Information  Description: The epidural space surrounds the nerves as they exit the spinal cord.  In some patients, the nerves can be compressed and inflamed by a bulging disc or a tight spinal canal (spinal stenosis).  By injecting steroids into the epidural space, we can bring irritated nerves into direct contact with a potentially helpful medication.  These steroids act directly on the irritated nerves and can reduce swelling and inflammation which often leads to decreased pain.  Epidural steroids may be injected anywhere along the spine and from the neck to the low back depending upon the location of your pain.   After numbing the skin with local anesthetic (like Novocaine), a small needle is passed into the epidural space slowly.  You may experience a sensation of pressure while this is being done.  The entire block usually last less than 10 minutes.  Conditions which may be treated by epidural steroids:  Low back and leg pain Neck and arm pain Spinal stenosis Post-laminectomy syndrome Herpes zoster (shingles) pain Pain from compression fractures  Preparation for the injection:  Do not eat any solid food or dairy products within 8 hours of your appointment.  You may drink clear liquids up to 3 hours before appointment.  Clear liquids include water, black coffee, juice or soda.  No milk or cream please. You may take your regular medication, including pain medications, with a sip of water before your appointment  Diabetics should hold regular insulin (if taken separately) and take 1/2 normal NPH dos the morning of the procedure.  Carry some sugar containing items  with you to your appointment. A driver must accompany you and be prepared to drive you home after your procedure.  Bring all your current medications with your. An IV may be inserted and sedation may be given at the discretion of the physician.   A blood pressure cuff, EKG and other monitors will often be applied during the procedure.  Some patients may need to have extra oxygen administered for a short period. You will be asked to provide medical information, including your allergies, prior to the procedure.  We must know immediately if you are taking blood thinners (like Coumadin/Warfarin)  Or if you are allergic to IV iodine contrast (dye). We must know if you could possible be pregnant.  Possible side-effects: Bleeding from needle site Infection (rare, may require surgery) Nerve injury (rare) Numbness & tingling (temporary) Difficulty urinating (rare, temporary) Spinal headache ( a headache worse with upright posture) Light -headedness (temporary) Pain at injection site (several days) Decreased blood pressure (temporary) Weakness in arm/leg (temporary) Pressure sensation in back/neck (temporary)  Call if you experience: Fever/chills associated with headache or increased back/neck pain. Headache worsened by an upright position. New onset weakness or numbness of an extremity below the injection site Hives or difficulty breathing (go  to the emergency room) Inflammation or drainage at the infection site Severe back/neck pain Any new symptoms which are concerning to you  Please note:  Although the local anesthetic injected can often make your back or neck feel good for several hours after the injection, the pain will likely return.  It takes 3-7 days for steroids to work in the epidural space.  You may not notice any pain relief for at least that one week.  If effective, we will often do a series of three injections spaced 3-6 weeks apart to maximally decrease your pain.  After the  initial series, we generally will wait several months before considering a repeat injection of the same type.  If you have any questions, please call [phone] [facility] Patient Information  Description: The facets are joints in the spine between the vertebrae.  Like any joints in the body, facets can become irritated and painful.  Arthritis can also effect the facets.  By injecting steroids and local anesthetic in and around these joints, we can temporarily block the nerve supply to them.  Steroids act directly on irritated nerves and tissues to reduce selling and inflammation which often leads to decreased pain.  Facet blocks may be done anywhere along the spine from the neck to the low back depending upon the location of your pain.   After numbing the skin with local anesthetic (like Novocaine), a small needle is passed onto the facet joints under x-ray guidance.  You may experience a sensation of pressure while this is being done.  The entire block usually lasts about 15-25 minutes.   Conditions which may be treated by facet blocks:  Low back/buttock pain Neck/shoulder pain Certain types of headaches  Preparation for the injection:  Do not eat any solid food or dairy products within 8 hours of your appointment. You may drink clear liquid up to 3 hours before appointment.  Clear liquids include water, black coffee, juice or soda.  No milk or cream please. You may take your regular medication, including pain medications, with a sip of water before your appointment.  Diabetics should hold regular insulin (if taken separately) and take 1/2 normal NPH dose the morning of the procedure.  Carry some sugar containing items with you to your appointment. A driver must accompany you and be prepared to drive you home after your procedure. Bring all your current medications with you. An IV may be inserted and sedation may be given at the discretion of  the physician. A blood pressure cuff, EKG and other monitors will often be applied during the procedure.  Some patients may need to have extra oxygen administered for a short period. You will be asked to provide medical information, including your allergies and medications, prior to the procedure.  We must know immediately if you are taking blood thinners (like Coumadin/Warfarin) or if you are allergic to IV iodine contrast (dye).  We must know if you could possible be pregnant.  Possible side-effects:  Bleeding from needle site Infection (rare, may require surgery) Nerve injury (rare) Numbness & tingling (temporary) Difficulty urinating (rare, temporary) Spinal headache (a headache worse with upright posture) Light-headedness (temporary) Pain at injection site (serveral days) Decreased blood pressure (rare, temporary) Weakness in arm/leg (temporary) Pressure sensation in back/neck (temporary)   Call if you experience:  Fever/chills associated with headache or increased back/neck pain Headache worsened by an upright position New onset, weakness or numbness of an extremity below the  injection site Hives or difficulty breathing (go to the emergency room) Inflammation or drainage at the injection site(s) Severe back/neck pain greater than usual New symptoms which are concerning to you  Please note:  Although the local anesthetic injected can often make your back or neck feel good for several hours after the injection, the pain will likely return. It takes 3-7 days for steroids to work.  You may not notice any pain relief for at least one week.  If effective, we will often do a series of 2-3 injections spaced 3-6 weeks apart to maximally decrease your pain.  After the initial series, you may be a candidate for a more permanent nerve block of the facets.  If you have any questions, please call #[phone] . [facility]

## 2016-07-18 ENCOUNTER — Ambulatory Visit
Admission: RE | Admit: 2016-07-18 | Discharge: 2016-07-18 | Disposition: A | Discharge status: Home / Self Care | Payer: Medicare HMO | Source: Ambulatory Visit | Attending: Pain Medicine | Admitting: Pain Medicine

## 2016-07-18 DIAGNOSIS — M25559 Pain in unspecified hip: Principal | ICD-10-CM

## 2016-07-18 DIAGNOSIS — G8929 Other chronic pain: Secondary | ICD-10-CM | POA: Diagnosis not present

## 2016-07-18 DIAGNOSIS — M160 Bilateral primary osteoarthritis of hip: Secondary | ICD-10-CM | POA: Diagnosis not present

## 2016-07-18 DIAGNOSIS — M1288 Other specific arthropathies, not elsewhere classified, other specified site: Secondary | ICD-10-CM | POA: Insufficient documentation

## 2016-07-27 ENCOUNTER — Telehealth: Payer: Self-pay | Admitting: Pain Medicine

## 2016-07-27 NOTE — Telephone Encounter (Signed)
[place] [facility] [place] [facility] [place] about Jennifer GermanyDorothy Dunne prior authorization about to be denied Says we can call for peer to peer  At [id number] option 4 then option 2, case # is [id number] I think it is procedure.

## 2016-07-28 ENCOUNTER — Ambulatory Visit: Payer: Medicare HMO

## 2016-08-01 ENCOUNTER — Encounter: Payer: Medicare HMO | Admitting: Pain Medicine

## 2016-08-10 ENCOUNTER — Ambulatory Visit
Admission: RE | Admit: 2016-08-10 | Discharge: 2016-08-10 | Disposition: A | Discharge status: Home / Self Care | Payer: Medicare HMO | Source: Ambulatory Visit | Attending: Pain Medicine | Admitting: Pain Medicine

## 2016-08-10 DIAGNOSIS — M5410 Radiculopathy, site unspecified: Secondary | ICD-10-CM

## 2016-08-10 DIAGNOSIS — M545 Low back pain, unspecified: Secondary | ICD-10-CM

## 2016-08-10 DIAGNOSIS — M48061 Spinal stenosis, lumbar region without neurogenic claudication: Secondary | ICD-10-CM | POA: Diagnosis not present

## 2016-08-10 DIAGNOSIS — M79605 Pain in left leg: Secondary | ICD-10-CM

## 2016-08-10 DIAGNOSIS — M5116 Intervertebral disc disorders with radiculopathy, lumbar region: Secondary | ICD-10-CM | POA: Diagnosis not present

## 2016-08-10 DIAGNOSIS — M79604 Pain in right leg: Secondary | ICD-10-CM

## 2016-08-10 DIAGNOSIS — G8929 Other chronic pain: Secondary | ICD-10-CM | POA: Diagnosis not present

## 2016-09-05 NOTE — Progress Notes (Signed)
Results were reviewed and found to be: abnormal, but not significant  Surgical consultation may be of benefit  Review would suggest interventional pain management techniques may be of benefit. The patient may benefit from a left L3-4 interlaminar lumbar epidural steroid injection and/or left L4-5 transforaminal epidural steroid injection

## 2016-09-28 ENCOUNTER — Encounter: Payer: Medicare HMO | Admitting: Pain Medicine

## 2016-10-04 NOTE — Progress Notes (Signed)
Patient's Name: Jennifer Rivers  MRN: [id number]  Referring Provider: Kirk Ruths, MD  DOB: Aug 29, 1945  PCP: Kirk Ruths, MD  DOS: 10/05/2016  Note by: Vevelyn Francois NP  Service setting: Ambulatory outpatient  Specialty: Interventional Pain Management  Location: [facility] (AMB) Pain Management Facility    Patient type: Established    Primary Reason(s) for Visit: Encounter for prescription drug management (Level of risk: moderate) CC: Neck Pain (shoulders bilateral and down arms to hands effects all fingers)  HPI  Jennifer Rivers is a 71 y.o. year old, female patient, who comes today for a medication management evaluation. She has Adult idiopathic generalized osteoporosis; Atypical migraine; COPD (chronic obstructive pulmonary disease) ([place]); Generalized OA; BP (high blood pressure); Current tear of meniscus; Major depression in remission ([place]); Chronic knee pain (Location of Primary Source of Pain) (Bilateral) (R>L); Long term current use of opiate analgesic; Long term prescription opiate use; Opiate use (20 MME/Day); Encounter for pain management planning; Encounter for therapeutic drug level monitoring; Chronic low back pain (Location of Secondary source of pain) (Bilateral) (L>R); Tricompartmental knee arthropathy (Bilateral); Chronic upper back pain (between shoulder blades) (Bilateral) (R>L); Chronic shoulder pain (Location of Tertiary source of pain) (Bilateral) (L>R); 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); Chronic lower extremity pain (Bilateral) (R>L); Chronic lower extremity radicular pain (Bilateral) (R>L); MRSA (methicillin resistant staph aureus) culture positive; Radicular pain of shoulder; Chronic cervical radicular pain (C4/C5 dermatomes) (L); Elevated sed rate; Elevated C-reactive protein (CRP); Chronic pain syndrome; Osteoarthritis of knees (Bilateral); Tear of meniscus of knee; Hip pain, chronic, unspecified laterality (B) (R>L); Primary osteoarthritis of hips, bilateral;  Spondylosis of lumbar spine; Lumbosacral facet joint syndrome ([place]); and Spinal stenosis of lumbar region on her problem list. Her primarily concern today is the Neck Pain (shoulders bilateral and down arms to hands effects all fingers)  Pain Assessment: Self-Reported Pain Score: 5 /10 Clinically the patient looks like a 3/10 Reported level is inconsistent with clinical observations. Information on the proper use of the pain scale provided to the patient today Pain Location: Neck Pain Orientation: Mid Pain Descriptors / Indicators: Aching, Constant, Sharp (cant lie on arms-hands hurting and unable to do minor things) Pain Frequency: Constant  Jennifer Rivers was last scheduled for an appointment on 07/17/16 for medication management. During today's appointment we reviewed Jennifer Rivers's chronic pain status, as well as her outpatient medication regimen. She has chronic low back pain. She is in today with her daughter. She states that her right knee is the worse pain today.  She admits that the right knee is worse. She has occasional pain in the left knee but not like the right. She was not to rest well last night secondary to the pain. She complained of neck and shoulder pain to nurse but never mentioned to me. She is concern that the medication is not strong enough. She states that it takes the medication about 30 min after taking it to start working. She states that it is not lasting for 6 hours. She is asking for an increase. She states that flexeril was once effective. She is not taking any NSAIDs.  The patient  reports that she does not use drugs. Her body mass index is 27.44 kg/m.  Further details on both, my assessment(s), as well as the proposed treatment plan, please see below.  Controlled Substance Pharmacotherapy Assessment REMS (Risk Evaluation and Mitigation Strategy)  Analgesic:Oxycodone IR 5 mg 1 tablet by mouth every 6 hours (20 mg/dayof oxycodone) MME/day:67m/day. GLeotis Shames  F, RN  10/05/2016  1:30 PM  Sign at close encounter Nursing Pain Medication Assessment:  Safety precautions to be maintained throughout the outpatient stay will include: orient to surroundings, keep bed in low position, maintain call bell within reach at all times, provide assistance with transfer out of bed and ambulation.  Medication Inspection Compliance: Pill count conducted under aseptic conditions, in front of the patient. Neither the pills nor the bottle was removed from the patient's sight at any time. Once count was completed pills were immediately returned to the patient in their original bottle.  Medication: See above Pill/Patch Count: 2 of 120 pills remain Pill/Patch Appearance: Markings consistent with prescribed medication Bottle Appearance: Standard pharmacy container. Clearly labeled. Filled Date: 4 / 26 / 2018 Last Medication intake:  Today   Pharmacokinetics: Liberation and absorption (onset of action): WNL Distribution (time to peak effect): WNL Metabolism and excretion (duration of action): WNL         Pharmacodynamics: Desired effects: Analgesia: Jennifer Rivers reports >50% benefit. Functional ability: Patient reports that medication allows her to accomplish basic ADLs Clinically meaningful improvement in function (CMIF): Sustained CMIF goals met Perceived effectiveness: Described as relatively effective, allowing for increase in activities of daily living (ADL) Undesirable effects: Side-effects or Adverse reactions: None reported Monitoring: [place] PMP: Online review of the past 7-monthperiod conducted. Compliant with practice rules and regulations List of all UDS test(s) done:  Lab Results  Component Value Date   TOXASSSELUR FINAL 04/06/2016   SUMMARY FINAL 12/28/2015   Last UDS on record: ToxAssure Select 13  Date Value Ref Range Status  04/06/2016 FINAL  Final    Comment:    ==================================================================== TOXASSURE SELECT  13 (MW) ==================================================================== Test                             Result       Flag       Units Drug Present and Declared for Prescription Verification   Oxycodone                      821          EXPECTED   ng/mg creat   Oxymorphone                    1518         EXPECTED   ng/mg creat   Noroxycodone                   1696         EXPECTED   ng/mg creat   Noroxymorphone                 296          EXPECTED   ng/mg creat    Sources of oxycodone are scheduled prescription medications.    Oxymorphone, noroxycodone, and noroxymorphone are expected    metabolites of oxycodone. Oxymorphone is also available as a    scheduled prescription medication. ==================================================================== Test                      Result    Flag   Units      Ref Range   Creatinine              28               mg/dL      >=  20 ==================================================================== Declared Medications:  The flagging and interpretation on this report are based on the  following declared medications.  Unexpected results may arise from  inaccuracies in the declared medications.  **Note: The testing scope of this panel includes these medications:  Oxycodone  **Note: The testing scope of this panel does not include following  reported medications:  Albuterol  Carvedilol  Cholecalciferol  Cyanocobalamin  Omeprazole ==================================================================== For clinical consultation, please call [phone]. ====================================================================    UDS interpretation: Compliant          Medication Assessment Form: Reviewed. Patient indicates being compliant with therapy Treatment compliance: Compliant Risk Assessment Profile: Aberrant behavior: See prior evaluations. None observed or detected today Comorbid factors increasing risk of overdose: See prior  notes. No additional risks detected today Risk of substance use disorder (SUD): Low Opioid Risk Tool (ORT) Total Score: 0  Interpretation Table:  Score <3 = Low Risk for SUD  Score between 4-7 = Moderate Risk for SUD  Score >8 = High Risk for Opioid Abuse   Risk Mitigation Strategies:  Patient Counseling: Covered Patient-Prescriber Agreement (PPA): Present and active  Notification to other healthcare providers: Done  Pharmacologic Plan: No change in therapy, at this time  Laboratory Chemistry  Inflammation Markers Lab Results  Component Value Date   CRP 3.0 (H) 12/28/2015   ESRSEDRATE 55 (H) 12/28/2015   (CRP: Acute Phase) (ESR: Chronic Phase) Renal Function Markers Lab Results  Component Value Date   BUN 18 12/28/2015   CREATININE 1.15 (H) 12/28/2015   GFRAA 56 (L) 12/28/2015   GFRNONAA 48 (L) 12/28/2015   Hepatic Function Markers Lab Results  Component Value Date   AST 24 12/28/2015   ALT 17 12/28/2015   ALBUMIN 4.2 12/28/2015   ALKPHOS 115 12/28/2015   Electrolytes Lab Results  Component Value Date   NA 134 (L) 12/28/2015   K 4.3 12/28/2015   CL 99 (L) 12/28/2015   CALCIUM 9.8 12/28/2015   MG 1.9 12/28/2015   Neuropathy Markers Lab Results  Component Value Date   VITAMINB12 176 (L) 12/28/2015   Bone Pathology Markers Lab Results  Component Value Date   ALKPHOS 115 12/28/2015   25OHVITD1 24 (L) 12/28/2015   25OHVITD2 2.5 12/28/2015   25OHVITD3 21 12/28/2015   CALCIUM 9.8 12/28/2015   Coagulation Parameters Lab Results  Component Value Date   PLT 444 (H) 09/06/2015   Cardiovascular Markers Lab Results  Component Value Date   HGB 9.5 (L) 09/06/2015   HCT 29.5 (L) 09/06/2015   Note: Lab results reviewed.  Recent Diagnostic Imaging Review  Mr Lumbar Spine Wo Contrast  Result Date: 08/10/2016 CLINICAL DATA:  Chronic low back pain.  Left hip pain arthralgia EXAM: MRI LUMBAR SPINE WITHOUT CONTRAST TECHNIQUE: Multiplanar, multisequence MR  imaging of the lumbar spine was performed. No intravenous contrast was administered. COMPARISON:  Lumbar MRI 11/24/2010 FINDINGS: Segmentation:  Normal Alignment:  Mild retrolisthesis L3-4 L4-5. Vertebrae:  Negative for fracture or mass. Conus medullaris: Extends to the L1-2 level and appears normal. Paraspinal and other soft tissues: Negative Disc levels: T12-L1:  Negative L1-2:  Negative L2-3: Mild disc degeneration and disc bulging. No significant stenosis. L3-4: Disc degeneration with disc bulging. Small left paracentral disc protrusion unchanged. Progressive spurring on the left causing mild foraminal encroachment. Subarticular stenosis on the left. Mild spinal stenosis with mild progression. L4-5: Moderate disc degeneration with disc space narrowing and diffuse endplate spurring. Spurring is more prominent right greater than the left and is similar  to the prior study. Subarticular stenosis on the right is unchanged. No significant canal stenosis. Mild right foraminal narrowing L5-S1: Disc degeneration and spurring on the right causing mild right foraminal narrowing, similar to the prior study. Right-sided facet hypertrophy. IMPRESSION: Small left paracentral disc protrusion L3-4 unchanged. Progressive spurring on the left. Subarticular stenosis on the left appears to have progressed and may be causing impingement of the left L4 nerve root. Mild progression of mild spinal stenosis Progressive spurring on the right at L4-5 with subarticular stenosis on the right. Mild right foraminal narrowing L5-S1 unchanged. Electronically Signed   By: Franchot Gallo M.D.   On: 08/10/2016 11:39   Note: Imaging results reviewed.          Meds  The patient has a current medication list which includes the following prescription(s): albuterol, carvedilol, cholecalciferol, cyanocobalamin, omeprazole, cyclobenzaprine, oxycodone, oxycodone, and oxycodone.  Current Outpatient Prescriptions on File Prior to Visit  Medication  Sig  . albuterol (VENTOLIN HFA) 108 (90 BASE) MCG/ACT inhaler Take 2 puffs by mouth every 6 (six) hours as needed.  . carvedilol (COREG) 12.5 MG tablet 12.5 mg 2 (two) times daily.   . Cholecalciferol (VITAMIN D-1000 MAX ST) 1000 units tablet Take by mouth daily.   . Cyanocobalamin (RA VITAMIN B-12 TR) 1000 MCG TBCR Take 1,000 mcg by mouth daily.   Marland Kitchen omeprazole (PRILOSEC) 20 MG capsule Take 20 mg by mouth daily.    No current facility-administered medications on file prior to visit.    ROS  Constitutional: Denies any fever or chills Gastrointestinal: No reported hemesis, hematochezia, vomiting, or acute GI distress Musculoskeletal: Denies any acute onset joint swelling, redness, loss of ROM, or weakness Neurological: No reported episodes of acute onset apraxia, aphasia, dysarthria, agnosia, amnesia, paralysis, loss of coordination, or loss of consciousness  Allergies  Jennifer Rivers is allergic to bupropion; codeine; gabapentin; tramadol; penicillins; and sulfa antibiotics.  [place]  Drug: Jennifer Rivers  reports that she does not use drugs. Alcohol:  reports that she does not drink alcohol. Tobacco:  reports that she has been smoking Cigarettes.  She has been smoking about 0.50 packs per day. She has never used smokeless tobacco. Medical:  has a past medical history of Asthma; GERD (gastroesophageal reflux disease); Hypertension; MRSA (methicillin resistant staph aureus) culture positive (08/2015); Multilevel degenerative disc disease; Osteoarthritis; and Osteoporosis. Family: family history includes Diabetes in her mother; Heart attack in her father.  Past Surgical History:  Procedure Laterality Date  . ABDOMINAL HYSTERECTOMY    . KNEE ARTHROSCOPY Right 09/15/2015   Procedure: ARTHROSCOPY KNEE partial medial and lateral menisectomy, chondroplasty;  Surgeon: Leanor Kail, MD;  Location: [facility] ORS;  Service: Orthopedics;  Laterality: Right;  . TONSILLECTOMY     Constitutional Exam  General  appearance: Well nourished, well developed, and well hydrated. In no apparent acute distress Vitals:   10/05/16 1312  BP: (!) 186/101  Pulse: (!) 107  Resp: 16  Temp: 98.6 F (37 C)  SpO2: 100%  Weight: 150 lb (68 kg)  Height: 5' 2" (1.575 m)   BMI Assessment: Estimated body mass index is 27.44 kg/m as calculated from the following:   Height as of this encounter: 5' 2" (1.575 m).   Weight as of this encounter: 150 lb (68 kg).  BMI interpretation table: BMI level Category Range association with higher incidence of chronic pain  <18 kg/m2 Underweight   18.5-24.9 kg/m2 Ideal body weight   25-29.9 kg/m2 Overweight Increased incidence by 20%  30-34.9 kg/m2  Obese (Class I) Increased incidence by 68%  35-39.9 kg/m2 Severe obesity (Class II) Increased incidence by 136%  >40 kg/m2 Extreme obesity (Class III) Increased incidence by 254%   BMI Readings from Last 4 Encounters:  10/05/16 27.44 kg/m  07/17/16 27.44 kg/m  07/12/16 27.25 kg/m  06/12/16 27.44 kg/m   Wt Readings from Last 4 Encounters:  10/05/16 150 lb (68 kg)  07/17/16 150 lb (68 kg)  07/12/16 149 lb (67.6 kg)  06/12/16 150 lb (68 kg)  Psych/Mental status: Alert, oriented x 3 (person, place, & time)       Eyes: PERLA Respiratory: No evidence of acute respiratory distress  Lumbar Spine Exam  Inspection: No masses, redness, or swelling Alignment: Symmetrical Functional ROM: Unrestricted ROM      Stability: No instability detected Muscle strength & Tone: Functionally intact Sensory: Unimpaired Palpation: Complains of area being tender to palpation       Provocative Tests: Lumbar Hyperextension and rotation test: evaluation deferred today       Patrick's Maneuver: evaluation deferred today                    Gait & Posture Assessment  Ambulation: Unassisted Gait: Relatively normal for age and body habitus Posture: WNL   Lower Extremity Exam    Side: Right lower extremity  Side: Left lower extremity   Inspection: Swelling  Inspection: Swelling  Functional ROM: Crepitus with extension  Functional ROM: Crepitus with extension          Muscle strength & Tone: Functionally intact  Muscle strength & Tone: Functionally intact  Sensory: Unimpaired  Sensory: Unimpaired  Palpation: Complains of area being tender to palpation  Palpation: Complains of area being tender to palpation   Assessment  Primary Diagnosis & Pertinent Problem List: The primary encounter diagnosis was Chronic knee pain (Location of Primary Source of Pain) (Bilateral) (R>L). Diagnoses of Spondylosis of lumbar spine, Lumbosacral facet joint syndrome ([place]), Spinal stenosis of lumbar region, unspecified whether neurogenic claudication present, Chronic pain syndrome, and Long term current use of opiate analgesic were also pertinent to this visit.  Status Diagnosis  Controlled Controlled Controlled 1. Chronic knee pain (Location of Primary Source of Pain) (Bilateral) (R>L)   2. Spondylosis of lumbar spine   3. Lumbosacral facet joint syndrome ([place])   4. Spinal stenosis of lumbar region, unspecified whether neurogenic claudication present   5. Chronic pain syndrome   6. Long term current use of opiate analgesic     Problems updated and reviewed during this visit: Problem  Spinal Stenosis of Lumbar Region   Plan of Care  Pharmacotherapy (Medications Ordered): Meds ordered this encounter  Medications  . oxyCODONE (OXY IR/ROXICODONE) 5 MG immediate release tablet    Sig: Take 1 tablet (5 mg total) by mouth every 6 (six) hours as needed for severe pain.    Dispense:  120 tablet    Refill:  0    Do not place this medication, or any other prescription from our practice, on "Automatic Refill". Patient may have prescription filled one day early if pharmacy is closed on scheduled refill date. Do not fill until: 12/05/16 To last until: 01/04/17    Order Specific Question:   Supervising Provider    Answer:   Milinda Pointer  [phone]  . oxyCODONE (OXY IR/ROXICODONE) 5 MG immediate release tablet    Sig: Take 1 tablet (5 mg total) by mouth every 6 (six) hours as needed for severe pain.    Dispense:  120 tablet    Refill:  0    Do not place this medication, or any other prescription from our practice, on "Automatic Refill". Patient may have prescription filled one day early if pharmacy is closed on scheduled refill date. Do not fill until: 11/05/16 To last until: 12/05/16    Order Specific Question:   Supervising Provider    Answer:   Milinda Pointer [phone]  . oxyCODONE (OXY IR/ROXICODONE) 5 MG immediate release tablet    Sig: Take 1 tablet (5 mg total) by mouth every 6 (six) hours as needed for severe pain.    Dispense:  120 tablet    Refill:  0    Do not place this medication, or any other prescription from our practice, on "Automatic Refill". Patient may have prescription filled one day early if pharmacy is closed on scheduled refill date. Do not fill until: 01/04/17 To last until: 02/03/17    Order Specific Question:   Supervising Provider    Answer:   Milinda Pointer [phone]  . cyclobenzaprine (FLEXERIL) 5 MG tablet    Sig: Take 1 tablet (5 mg total) by mouth 2 (two) times daily as needed for muscle spasms. Do not take with other pain medications. Do not drive or operate machinery while taking as can cause drowsiness.    Dispense:  90 tablet    Refill:  2    Order Specific Question:   Supervising Provider    Answer:   Milinda Pointer [phone]   New Prescriptions   No medications on file   Medications administered today: Jennifer Rivers had no medications administered during this visit. Lab-work, procedure(s), and/or referral(s): Orders Placed This Encounter  Procedures  . KNEE INJECTION  . ToxASSURE Select 13 (MW), Urine   Imaging and/or referral(s): None  Interventional therapies: Planned, scheduled, and/or pending:   Encourage injections for the knee Start Flexeril  Encourage pt to  follow up with PCP for evaluation of BP, anemia and elevated Creatinine   Considering:   Left cervical epidural steroid injection #2 Diagnostic bilateral intra-articular knee injection Possible series of 5 bilateral Hyalgan intra-articular knee injections Diagnostic bilateral genicular nerve blocks  Possible bilateral genicular nerve radiofrequency ablation Diagnostic bilateral lumbar facet block  Possible bilateral lumbar facet radiofrequency ablation  Diagnostic bilateral intra-articular shoulder joint injection  Diagnostic bilateral suprascapular nerve block  Possible bilateral suprascapular nerve radiofrequency ablation  Diagnostic right-sided L5-S1 transforaminal epidural steroid injection Diagnostic left sided S1 transforaminal epidural steroid injection Diagnostic right-sided L4-5 lumbar epidural steroid injection    Palliative PRN treatment(s):   Palliative bilateral intra-articular knee injection with local anesthetic and steroid. Left cervical epidural steroid injection #2   Provider-requested follow-up: Return in about 3 months (around 01/05/2017) for Medication Mgmt.  Future Appointments Date Time Provider [place]  01/04/2017 1:00 PM Vevelyn Francois, NP [facility] None   Primary Care Physician: Kirk Ruths, MD Location: [facility] Facility Note by: Vevelyn Francois NP Date: 10/05/2016; Time: 2:36 PM  Pain Score Disclaimer: We use the NRS-11 scale. This is a self-reported, subjective measurement of pain severity with only modest accuracy. It is used primarily to identify changes within a particular patient. It must be understood that outpatient pain scales are significantly less accurate that those used for research, where they can be applied under ideal controlled circumstances with minimal exposure to variables. In reality, the score is likely to be a combination of pain intensity and pain affect, where pain affect describes the degree  of emotional arousal or changes in action readiness caused by the sensory experience of pain. Factors such as social and work situation, setting, emotional state, anxiety levels, expectation, and prior pain experience may influence pain perception and show large inter-individual differences that may also be affected by time variables.  Patient instructions provided during this appointment: Patient Instructions   ____________________________________________________________________________________________  Medication Rules  Applies to: All patients receiving prescriptions (written or electronic).  Pharmacy of record: Pharmacy where electronic prescriptions will be sent. If written prescriptions are taken to a different pharmacy, please inform the nursing staff. The pharmacy listed in the electronic medical record should be the one where you would like electronic prescriptions to be sent.  Prescription refills: Only during scheduled appointments. Applies to both, written and electronic prescriptions.  NOTE: The following applies primarily to controlled substances (Opioid Pain Medications)  Patient's responsibilities: 1. Pain Pills: Bring all pain pills to every appointment (except for procedure appointments). 2. Pill Bottles: Bring pills in original pharmacy bottle. Always bring newest bottle. Bring bottle, even if empty. 3. Medication refills: You are responsible for knowing and keeping track of what medications you need refilled. The day before your appointment, write a list of all prescriptions that need to be refilled. Bring that list to your appointment and give it to the admitting nurse. Prescriptions will be written only during appointments. If you forget a medication, it will not be "Called in", "Faxed", or "electronically sent". You will need to get another appointment to get these prescribed. 4. Prescription Accuracy: You are responsible for carefully inspecting your prescriptions before  leaving our office. Have the discharge nurse carefully go over each prescription with you, before taking them home. Make sure that your name is accurately spelled, that your address is correct. Check the name and dose of your medication to make sure it is accurate. Check the number of pills, and the written instructions to make sure they are clear and accurate. Make sure that you are given enough medication to last until your next medication refill appointment. 5. Taking Medication: Take medication as prescribed. Never take more pills than instructed. Never take medication more frequently than prescribed. Taking less pills or less frequently is permitted and encouraged, when it comes to controlled substances (written prescriptions).  6. Inform other Doctors: Always inform, all of your healthcare providers, of all the medications you take. 7. Pain Medication from other Providers: You are not allowed to accept any additional pain medication from any other Doctor or Healthcare provider. There are two exceptions to this rule. (see below) In the event that you require additional pain medication, you are responsible for notifying us, as stated below. 8. Medication Agreement: You are responsible for carefully reading and following our Medication Agreement. This must be signed before receiving any prescriptions from our practice. Safely store a copy of your signed Agreement. Violations to the Agreement will result in no further prescriptions. (Additional copies of our Medication Agreement are available upon request.) 9. Laws, Rules, & Regulations: All patients are expected to follow all Federal and [employer], [employer], Rules, [employer]. Ignorance of the Laws does not constitute a valid excuse.  Exceptions: There are only two exceptions to the rule of not receiving pain medications from other Healthcare Providers. 1. Exception #1 (Emergencies): In the event of an emergency (i.e.: accident requiring emergency  care), you are allowed to receive additional pain medication. However, you are responsible for: As soon as you are able, call our office [phone], at  any time of the day or night, and leave a message stating your name, the date and nature of the emergency, and the name and dose of the medication prescribed. In the event that your call is answered by a member of our staff, make sure to document and save the date, time, and the name of the person that took your information.  2. Exception #2 (Planned Surgery): In the event that you are scheduled by another doctor or dentist to have any type of surgery or procedure, you are allowed (for a period no longer than 30 days), to receive additional pain medication, for the acute post-op pain. However, in this case, you are responsible for picking up a copy of our "Post-op Pain Management for Surgeons" handout, and giving it to your surgeon or dentist. This document is available at our office, and does not require an appointment to obtain it. Simply go to our office during business hours (Monday-Thursday from 8:00 AM to 4:00 PM) (Friday 8:00 AM to 12:00 Noon) or if you have a scheduled appointment with [place], prior to your surgery, and ask for it by name. In addition, you will need to provide [place] with your name, name of your surgeon, type of surgery, and date of procedure or surgery.  _____________________________________________________________________________________________  Pain Score  Introduction: The pain score used by this practice is the Verbal Numerical Rating Scale (VNRS-11). This is an 11-point scale. It is for adults and children 10 years or older. There are significant differences in how the pain score is reported, used, and applied. Forget everything you learned in the past and learn this scoring system.  General Information: The scale should reflect your current level of pain. Unless you are specifically asked for the level of your worst pain, or your  average pain. If you are asked for one of these two, then it should be understood that it is over the past 24 hours.  Basic Activities of Daily Living (ADL): Personal hygiene, dressing, eating, transferring, and using restroom.  Instructions: Most patients tend to report their level of pain as a combination of two factors, their physical pain and their psychosocial pain. This last one is also known as "suffering" and it is reflection of how physical pain affects you socially and psychologically. From now on, report them separately. From this point on, when asked to report your pain level, report only your physical pain. Use the following table for reference.  Pain Clinic Pain Levels (0-5/10)  Pain Level Score Description  No Pain 0   Mild pain 1 Nagging, annoying, but does not interfere with basic activities of daily living (ADL). Patients are able to eat, bathe, get dressed, toileting (being able to get on and off the toilet and perform personal hygiene functions), transfer (move in and out of bed or a chair without assistance), and maintain continence (able to control bladder and bowel functions). Blood pressure and heart rate are unaffected. A normal heart rate for a healthy adult ranges from 60 to 100 bpm (beats per minute).   Mild to moderate pain 2 Noticeable and distracting. Impossible to hide from other people. More frequent flare-ups. Still possible to adapt and function close to normal. It can be very annoying and may have occasional stronger flare-ups. With discipline, patients may get used to it and adapt.   Moderate pain 3 Interferes significantly with activities of daily living (ADL). It becomes difficult to feed, bathe, get dressed, get on and off the toilet or to perform  personal hygiene functions. Difficult to get in and out of bed or a chair without assistance. Very distracting. With effort, it can be ignored when deeply involved in activities.   Moderately severe pain 4 Impossible  to ignore for more than a few minutes. With effort, patients may still be able to manage work or participate in some social activities. Very difficult to concentrate. Signs of autonomic nervous system discharge are evident: dilated pupils (mydriasis); mild sweating (diaphoresis); sleep interference. Heart rate becomes elevated (>115 bpm). Diastolic blood pressure (lower number) rises above 100 mmHg. Patients find relief in laying down and not moving.   Severe pain 5 Intense and extremely unpleasant. Associated with frowning face and frequent crying. Pain overwhelms the senses.  Ability to do any activity or maintain social relationships becomes significantly limited. Conversation becomes difficult. Pacing back and forth is common, as getting into a comfortable position is nearly impossible. Pain wakes you up from deep sleep. Physical signs will be obvious: pupillary dilation; increased sweating; goosebumps; brisk reflexes; cold, clammy hands and feet; nausea, vomiting or dry heaves; loss of appetite; significant sleep disturbance with inability to fall asleep or to remain asleep. When persistent, significant weight loss is observed due to the complete loss of appetite and sleep deprivation.  Blood pressure and heart rate becomes significantly elevated. Caution: If elevated blood pressure triggers a pounding headache associated with blurred vision, then the patient should immediately seek attention at an urgent or emergency care unit, as these may be signs of an impending stroke.    Emergency Department Pain Levels (6-10/10)  Emergency Room Pain 6 Severely limiting. Requires emergency care and should not be seen or managed at an outpatient pain management facility. Communication becomes difficult and requires great effort. Assistance to reach the emergency department may be required. Facial flushing and profuse sweating along with potentially dangerous increases in heart rate and blood pressure will be  evident.   Distressing pain 7 Self-care is very difficult. Assistance is required to transport, or use restroom. Assistance to reach the emergency department will be required. Tasks requiring coordination, such as bathing and getting dressed become very difficult.   Disabling pain 8 Self-care is no longer possible. At this level, pain is disabling. The individual is unable to do even the most "basic" activities such as walking, eating, bathing, dressing, transferring to a bed, or toileting. Fine motor skills are lost. It is difficult to think clearly.   Incapacitating pain 9 Pain becomes incapacitating. Thought processing is no longer possible. Difficult to remember your own name. Control of movement and coordination are lost.   The worst pain imaginable 10 At this level, most patients pass out from pain. When this level is reached, collapse of the autonomic nervous system occurs, leading to a sudden drop in blood pressure and heart rate. This in turn results in a temporary and dramatic drop in blood flow to the brain, leading to a loss of consciousness. Fainting is one of the body's self defense mechanisms. Passing out puts the brain in a calmed state and causes it to shut down for a while, in order to begin the healing process.    Summary: 1. Refer to this scale when providing [place] with your pain level. 2. Be accurate and careful when reporting your pain level. This will help with your care. 3. Over-reporting your pain level will lead to loss of credibility. 4. Even a level of 1/10 means that there is pain and will be treated at our facility.  5. High, inaccurate reporting will be documented as "Symptom Exaggeration", leading to loss of credibility and suspicions of possible secondary gains such as obtaining more narcotics, or wanting to appear disabled, for fraudulent reasons. 6. Only pain levels of 5 or below will be seen at our facility. 7. Pain levels of 6 and above will be sent to the Emergency  Department and the appointment cancelled. _____________________________________________________________________________________________  Pain Management Discharge Instructions  General Discharge Instructions :  If you need to reach your doctor call: Monday-Friday 8:00 am - 4:00 pm at [phone] or toll free [phone].  After clinic hours [phone] to have operator reach doctor.  Bring all of your medication bottles to all your appointments in the pain clinic.  To cancel or reschedule your appointment with Pain Management please remember to call 24 hours in advance to avoid a fee.  Refer to the educational materials which you have been given on: General Risks, I had my Procedure. Discharge Instructions, Post Sedation.  Post Procedure Instructions:  The drugs you were given will stay in your system until tomorrow, so for the next 24 hours you should not drive, make any legal decisions or drink any alcoholic beverages.  You may eat anything you prefer, but it is better to start with liquids then soups and crackers, and gradually work up to solid foods.  Please notify your doctor immediately if you have any unusual bleeding, trouble breathing or pain that is not related to your normal pain.  Depending on the type of procedure that was done, some parts of your body may feel week and/or numb.  This usually clears up by tonight or the next day.  Walk with the use of an assistive device or accompanied by an adult for the 24 hours.  You may use ice on the affected area for the first 24 hours.  Put ice in a Ziploc bag and cover with a towel and place against area 15 minutes on 15 minutes off.  You may switch to heat after 24 hours.GENERAL RISKS AND COMPLICATIONS  What are the risk, side effects and possible complications? Generally speaking, most procedures are safe.  However, with any procedure there are risks, side effects, and the possibility of complications.  The risks and  complications are dependent upon the sites that are lesioned, or the type of nerve block to be performed.  The closer the procedure is to the spine, the more serious the risks are.  Great care is taken when placing the radio frequency needles, block needles or lesioning probes, but sometimes complications can occur. 1. Infection: Any time there is an injection through the skin, there is a risk of infection.  This is why sterile conditions are used for these blocks.  There are four possible types of infection. 1. Localized skin infection. 2. Central Nervous System Infection-This can be in the form of Meningitis, which can be deadly. 3. Epidural Infections-This can be in the form of an epidural abscess, which can cause pressure inside of the spine, causing compression of the spinal cord with subsequent paralysis. This would require an emergency surgery to decompress, and there are no guarantees that the patient would recover from the paralysis. 4. Discitis-This is an infection of the intervertebral discs.  It occurs in about 1% of discography procedures.  It is difficult to treat and it may lead to surgery.        2. Pain: the needles have to go through skin and soft tissues, will cause soreness.  3. Damage to internal structures:  The nerves to be lesioned may be near blood vessels or    other nerves which can be potentially damaged.       4. Bleeding: Bleeding is more common if the patient is taking blood thinners such as  aspirin, Coumadin, Ticiid, Plavix, etc., or if he/she have some genetic predisposition  such as hemophilia. Bleeding into the spinal canal can cause compression of the spinal  cord with subsequent paralysis.  This would require an emergency surgery to  decompress and there are no guarantees that the patient would recover from the  paralysis.       5. Pneumothorax:  Puncturing of a lung is a possibility, every time a needle is introduced in  the area of the chest or upper back.   Pneumothorax refers to free air around the  collapsed lung(s), inside of the thoracic cavity (chest cavity).  Another two possible  complications related to a similar event would include: Hemothorax and Chylothorax.   These are variations of the Pneumothorax, where instead of air around the collapsed  lung(s), you may have blood or chyle, respectively.       6. Spinal headaches: They may occur with any procedures in the area of the spine.       7. Persistent CSF (Cerebro-Spinal Fluid) leakage: This is a rare problem, but may occur  with prolonged intrathecal or epidural catheters either due to the formation of a fistulous  track or a dural tear.       8. Nerve damage: By working so close to the spinal cord, there is always a possibility of  nerve damage, which could be as serious as a permanent spinal cord injury with  paralysis.       9. Death:  Although rare, severe deadly allergic reactions known as "Anaphylactic  reaction" can occur to any of the medications used.      10. Worsening of the symptoms:  We can always make thing worse.  What are the chances of something like this happening? Chances of any of this occuring are extremely low.  By statistics, you have more of a chance of getting killed in a motor vehicle accident: while driving to the hospital than any of the above occurring .  Nevertheless, you should be aware that they are possibilities.  In general, it is similar to taking a shower.  Everybody knows that you can slip, hit your head and get killed.  Does that mean that you should not shower again?  Nevertheless always keep in mind that statistics do not mean anything if you happen to be on the wrong side of them.  Even if a procedure has a 1 (one) in a 1,000,000 (million) chance of going wrong, it you happen to be that one..Also, keep in mind that by statistics, you have more of a chance of having something go wrong when taking medications.  Who should not have this procedure? If you are  on a blood thinning medication (e.g. Coumadin, Plavix, see list of "Blood Thinners"), or if you have an active infection going on, you should not have the procedure.  If you are taking any blood thinners, please inform your physician.  How should I prepare for this procedure?  Do not eat or drink anything at least six hours prior to the procedure.  Bring a driver with you .  It cannot be a taxi.  Come accompanied by an adult that can drive you back, and  that is strong enough to help you if your legs get weak or numb from the local anesthetic.  Take all of your medicines the morning of the procedure with just enough water to swallow them.  If you have diabetes, make sure that you are scheduled to have your procedure done first thing in the morning, whenever possible.  If you have diabetes, take only half of your insulin dose and notify our nurse that you have done so as soon as you arrive at the clinic.  If you are diabetic, but only take blood sugar pills (oral hypoglycemic), then do not take them on the morning of your procedure.  You may take them after you have had the procedure.  Do not take aspirin or any aspirin-containing medications, at least eleven (11) days prior to the procedure.  They may prolong bleeding.  Wear loose fitting clothing that may be easy to take off and that you would not mind if it got stained with Betadine or blood.  Do not wear any jewelry or perfume  Remove any nail coloring.  It will interfere with some of our monitoring equipment.  NOTE: Remember that this is not meant to be interpreted as a complete list of all possible complications.  Unforeseen problems may occur.  BLOOD THINNERS The following drugs contain aspirin or other products, which can cause increased bleeding during surgery and should not be taken for 2 weeks prior to and 1 week after surgery.  If you should need take something for relief of minor pain, you may take acetaminophen which is found  in Tylenol,m Datril, Anacin-3 and Panadol. It is not blood thinner. The products listed below are.  Do not take any of the products listed below in addition to any listed on your instruction sheet.  A.P.C or A.P.C with Codeine Codeine Phosphate Capsules #3 Ibuprofen Ridaura  ABC compound Congesprin Imuran rimadil  Advil Cope Indocin Robaxisal  Alka-Seltzer Effervescent Pain Reliever and Antacid Coricidin or Coricidin-D  Indomethacin Rufen  Alka-Seltzer plus Cold Medicine Cosprin Ketoprofen S-A-C Tablets  Anacin Analgesic Tablets or Capsules Coumadin Korlgesic Salflex  Anacin Extra Strength Analgesic tablets or capsules CP-2 Tablets Lanoril Salicylate  Anaprox Cuprimine Capsules Levenox Salocol  Anexsia-D Dalteparin Magan Salsalate  Anodynos Darvon compound Magnesium Salicylate Sine-off  Ansaid Dasin Capsules Magsal Sodium Salicylate  Anturane Depen Capsules Marnal Soma  APF Arthritis pain formula Dewitt's Pills Measurin Stanback  Argesic Dia-Gesic Meclofenamic Sulfinpyrazone  Arthritis Bayer Timed Release Aspirin Diclofenac Meclomen Sulindac  Arthritis pain formula Anacin Dicumarol Medipren Supac  Analgesic (Safety coated) Arthralgen Diffunasal Mefanamic Suprofen  Arthritis Strength Bufferin Dihydrocodeine Mepro Compound Suprol  Arthropan liquid Dopirydamole Methcarbomol with Aspirin Synalgos  ASA tablets/Enseals Disalcid Micrainin Tagament  Ascriptin Doan's Midol Talwin  Ascriptin A/D Dolene Mobidin Tanderil  Ascriptin Extra Strength Dolobid Moblgesic Ticlid  Ascriptin with Codeine Doloprin or Doloprin with Codeine Momentum Tolectin  Asperbuf Duoprin Mono-gesic Trendar  Aspergum Duradyne Motrin or Motrin IB Triminicin  Aspirin plain, buffered or enteric coated Durasal Myochrisine Trigesic  Aspirin Suppositories Easprin Nalfon Trillsate  Aspirin with Codeine Ecotrin Regular or Extra Strength Naprosyn Uracel  Atromid-S Efficin Naproxen Ursinus  Auranofin Capsules Elmiron Neocylate  Vanquish  Axotal Emagrin Norgesic Verin  Azathioprine Empirin or Empirin with Codeine Normiflo Vitamin E  Azolid Emprazil Nuprin Voltaren  Bayer Aspirin plain, buffered or children's or timed BC Tablets or powders Encaprin Orgaran Warfarin Sodium  Buff-a-Comp Enoxaparin Orudis Zorpin  Buff-a-Comp with Codeine Equegesic Os-Cal-Gesic   Buffaprin Excedrin plain, buffered  or Extra Strength Oxalid   Bufferin Arthritis Strength Feldene Oxphenbutazone   Bufferin plain or Extra Strength Feldene Capsules Oxycodone with Aspirin   Bufferin with Codeine Fenoprofen Fenoprofen Pabalate or Pabalate-SF   Buffets II Flogesic Panagesic   Buffinol plain or Extra Strength Florinal or Florinal with Codeine Panwarfarin   Buf-Tabs Flurbiprofen Penicillamine   Butalbital Compound Four-way cold tablets Penicillin   Butazolidin Fragmin Pepto-Bismol   Carbenicillin Geminisyn Percodan   Carna Arthritis Reliever Geopen Persantine   Carprofen Gold's salt Persistin   Chloramphenicol Goody's Phenylbutazone   Chloromycetin Haltrain Piroxlcam   Clmetidine heparin Plaquenil   Cllnoril Hyco-pap Ponstel   Clofibrate Hydroxy chloroquine Propoxyphen         Before stopping any of these medications, be sure to consult the physician who ordered them.  Some, such as Coumadin (Warfarin) are ordered to prevent or treat serious conditions such as "deep thrombosis", "pumonary embolisms", and other heart problems.  The amount of time that you may need off of the medication may also vary with the medication and the reason for which you were taking it.  If you are taking any of these medications, please make sure you notify your pain physician before you undergo any procedures.

## 2016-10-05 ENCOUNTER — Ambulatory Visit: Payer: Medicare HMO | Attending: Pain Medicine | Admitting: Nurse Practitioner

## 2016-10-05 ENCOUNTER — Encounter: Payer: Self-pay | Admitting: Nurse Practitioner

## 2016-10-05 VITALS — BP 186/101 | HR 107 | Temp 98.6°F | Resp 16 | Ht 62.0 in | Wt 150.0 lb

## 2016-10-05 DIAGNOSIS — M48061 Spinal stenosis, lumbar region without neurogenic claudication: Secondary | ICD-10-CM | POA: Diagnosis not present

## 2016-10-05 DIAGNOSIS — G894 Chronic pain syndrome: Secondary | ICD-10-CM | POA: Diagnosis not present

## 2016-10-05 DIAGNOSIS — M47816 Spondylosis without myelopathy or radiculopathy, lumbar region: Secondary | ICD-10-CM

## 2016-10-05 DIAGNOSIS — M4697 Unspecified inflammatory spondylopathy, lumbosacral region: Secondary | ICD-10-CM

## 2016-10-05 DIAGNOSIS — Z79899 Other long term (current) drug therapy: Secondary | ICD-10-CM | POA: Diagnosis not present

## 2016-10-05 DIAGNOSIS — Z885 Allergy status to narcotic agent status: Secondary | ICD-10-CM | POA: Diagnosis not present

## 2016-10-05 DIAGNOSIS — Z5181 Encounter for therapeutic drug level monitoring: Secondary | ICD-10-CM | POA: Diagnosis not present

## 2016-10-05 DIAGNOSIS — M5412 Radiculopathy, cervical region: Secondary | ICD-10-CM | POA: Insufficient documentation

## 2016-10-05 DIAGNOSIS — M170 Bilateral primary osteoarthritis of knee: Secondary | ICD-10-CM | POA: Insufficient documentation

## 2016-10-05 DIAGNOSIS — M25561 Pain in right knee: Secondary | ICD-10-CM

## 2016-10-05 DIAGNOSIS — M25562 Pain in left knee: Secondary | ICD-10-CM

## 2016-10-05 DIAGNOSIS — G8929 Other chronic pain: Secondary | ICD-10-CM | POA: Diagnosis not present

## 2016-10-05 DIAGNOSIS — Z880 Allergy status to penicillin: Secondary | ICD-10-CM | POA: Diagnosis not present

## 2016-10-05 DIAGNOSIS — M48062 Spinal stenosis, lumbar region with neurogenic claudication: Secondary | ICD-10-CM | POA: Insufficient documentation

## 2016-10-05 DIAGNOSIS — Z79891 Long term (current) use of opiate analgesic: Secondary | ICD-10-CM

## 2016-10-05 DIAGNOSIS — M47817 Spondylosis without myelopathy or radiculopathy, lumbosacral region: Secondary | ICD-10-CM

## 2016-10-05 DIAGNOSIS — M160 Bilateral primary osteoarthritis of hip: Secondary | ICD-10-CM | POA: Diagnosis not present

## 2016-10-05 MED ORDER — OXYCODONE HCL 5 MG PO TABS: 5.0000 mg | ORAL_TABLET | Freq: Four times a day (QID) | ORAL | 0 refills | Status: DC | PRN

## 2016-10-05 MED ORDER — CYCLOBENZAPRINE HCL 5 MG PO TABS: 5.0000 mg | ORAL_TABLET | Freq: Two times a day (BID) | ORAL | 2 refills | Status: AC | PRN

## 2016-10-05 NOTE — Progress Notes (Signed)
Nursing Pain Medication Assessment:  Safety precautions to be maintained throughout the outpatient stay will include: orient to surroundings, keep bed in low position, maintain call bell within reach at all times, provide assistance with transfer out of bed and ambulation.  Medication Inspection Compliance: Pill count conducted under aseptic conditions, in front of the patient. Neither the pills nor the bottle was removed from the patient's sight at any time. Once count was completed pills were immediately returned to the patient in their original bottle.  Medication: See above Pill/Patch Count: 2 of 120 pills remain Pill/Patch Appearance: Markings consistent with prescribed medication Bottle Appearance: Standard pharmacy container. Clearly labeled. Filled Date: 4 / 1018 / 2018 Last Medication intake:  Today

## 2016-10-05 NOTE — Patient Instructions (Addendum)
____________________________________________________________________________________________  Medication Rules  Applies to: All patients receiving prescriptions (written or electronic).  Pharmacy of record: Pharmacy where electronic prescriptions will be sent. If written prescriptions are taken to a different pharmacy, please inform the nursing staff. The pharmacy listed in the electronic medical record should be the one where you would like electronic prescriptions to be sent.  Prescription refills: Only during scheduled appointments. Applies to both, written and electronic prescriptions.  NOTE: The following applies primarily to controlled substances (Opioid Pain Medications)  Patient's responsibilities: 1. Pain Pills: Bring all pain pills to every appointment (except for procedure appointments). 2. Pill Bottles: Bring pills in original pharmacy bottle. Always bring newest bottle. Bring bottle, even if empty. 3. Medication refills: You are responsible for knowing and keeping track of what medications you need refilled. The day before your appointment, write a list of all prescriptions that need to be refilled. Bring that list to your appointment and give it to the admitting nurse. Prescriptions will be written only during appointments. If you forget a medication, it will not be "Called in", "Faxed", or "electronically sent". You will need to get another appointment to get these prescribed. 4. Prescription Accuracy: You are responsible for carefully inspecting your prescriptions before leaving our office. Have the discharge nurse carefully go over each prescription with you, before taking them home. Make sure that your name is accurately spelled, that your address is correct. Check the name and dose of your medication to make sure it is accurate. Check the number of pills, and the written instructions to make sure they are clear and accurate. Make sure that you are given enough medication to last  until your next medication refill appointment. 5. Taking Medication: Take medication as prescribed. Never take more pills than instructed. Never take medication more frequently than prescribed. Taking less pills or less frequently is permitted and encouraged, when it comes to controlled substances (written prescriptions).  6. Inform other Doctors: Always inform, all of your healthcare providers, of all the medications you take. 7. Pain Medication from other Providers: You are not allowed to accept any additional pain medication from any other Doctor or Healthcare provider. There are two exceptions to this rule. (see below) In the event that you require additional pain medication, you are responsible for notifying us, as stated below. 8. Medication Agreement: You are responsible for carefully reading and following our Medication Agreement. This must be signed before receiving any prescriptions from our practice. Safely store a copy of your signed Agreement. Violations to the Agreement will result in no further prescriptions. (Additional copies of our Medication Agreement are available upon request.) 9. Laws, Rules, & Regulations: All patients are expected to follow all Federal and State Laws, Statutes, Rules, & Regulations. Ignorance of the Laws does not constitute a valid excuse.  Exceptions: There are only two exceptions to the rule of not receiving pain medications from other Healthcare Providers. 1. Exception #1 (Emergencies): In the event of an emergency (i.e.: accident requiring emergency care), you are allowed to receive additional pain medication. However, you are responsible for: As soon as you are able, call our office [phone], at any time of the day or night, and leave a message stating your name, the date and nature of the emergency, and the name and dose of the medication prescribed. In the event that your call is answered by a member of our staff, make sure to document and save the date,  time, and the name of the person that   took your information.  2. Exception #2 (Planned Surgery): In the event that you are scheduled by another doctor or dentist to have any type of surgery or procedure, you are allowed (for a period no longer than 30 days), to receive additional pain medication, for the acute post-op pain. However, in this case, you are responsible for picking up a copy of our "Post-op Pain Management for Surgeons" handout, and giving it to your surgeon or dentist. This document is available at our office, and does not require an appointment to obtain it. Simply go to our office during business hours (Monday-Thursday from 8:00 AM to 4:00 PM) (Friday 8:00 AM to 12:00 Noon) or if you have a scheduled appointment with us, prior to your surgery, and ask for it by name. In addition, you will need to provide us with your name, name of your surgeon, type of surgery, and date of procedure or surgery.  _____________________________________________________________________________________________  Pain Score  Introduction: The pain score used by this practice is the Verbal Numerical Rating Scale (VNRS-11). This is an 11-point scale. It is for adults and children 10 years or older. There are significant differences in how the pain score is reported, used, and applied. Forget everything you learned in the past and learn this scoring system.  General Information: The scale should reflect your current level of pain. Unless you are specifically asked for the level of your worst pain, or your average pain. If you are asked for one of these two, then it should be understood that it is over the past 24 hours.  Basic Activities of Daily Living (ADL): Personal hygiene, dressing, eating, transferring, and using restroom.  Instructions: Most patients tend to report their level of pain as a combination of two factors, their physical pain and their psychosocial pain. This last one is also known as "suffering"  and it is reflection of how physical pain affects you socially and psychologically. From now on, report them separately. From this point on, when asked to report your pain level, report only your physical pain. Use the following table for reference.  Pain Clinic Pain Levels (0-5/10)  Pain Level Score Description  No Pain 0   Mild pain 1 Nagging, annoying, but does not interfere with basic activities of daily living (ADL). Patients are able to eat, bathe, get dressed, toileting (being able to get on and off the toilet and perform personal hygiene functions), transfer (move in and out of bed or a chair without assistance), and maintain continence (able to control bladder and bowel functions). Blood pressure and heart rate are unaffected. A normal heart rate for a healthy adult ranges from 60 to 100 bpm (beats per minute).   Mild to moderate pain 2 Noticeable and distracting. Impossible to hide from other people. More frequent flare-ups. Still possible to adapt and function close to normal. It can be very annoying and may have occasional stronger flare-ups. With discipline, patients may get used to it and adapt.   Moderate pain 3 Interferes significantly with activities of daily living (ADL). It becomes difficult to feed, bathe, get dressed, get on and off the toilet or to perform personal hygiene functions. Difficult to get in and out of bed or a chair without assistance. Very distracting. With effort, it can be ignored when deeply involved in activities.   Moderately severe pain 4 Impossible to ignore for more than a few minutes. With effort, patients may still be able to manage work or participate in some social activities.   Very difficult to concentrate. Signs of autonomic nervous system discharge are evident: dilated pupils (mydriasis); mild sweating (diaphoresis); sleep interference. Heart rate becomes elevated (>115 bpm). Diastolic blood pressure (lower number) rises above 100 mmHg. Patients find  relief in laying down and not moving.   Severe pain 5 Intense and extremely unpleasant. Associated with frowning face and frequent crying. Pain overwhelms the senses.  Ability to do any activity or maintain social relationships becomes significantly limited. Conversation becomes difficult. Pacing back and forth is common, as getting into a comfortable position is nearly impossible. Pain wakes you up from deep sleep. Physical signs will be obvious: pupillary dilation; increased sweating; goosebumps; brisk reflexes; cold, clammy hands and feet; nausea, vomiting or dry heaves; loss of appetite; significant sleep disturbance with inability to fall asleep or to remain asleep. When persistent, significant weight loss is observed due to the complete loss of appetite and sleep deprivation.  Blood pressure and heart rate becomes significantly elevated. Caution: If elevated blood pressure triggers a pounding headache associated with blurred vision, then the patient should immediately seek attention at an urgent or emergency care unit, as these may be signs of an impending stroke.    Emergency Department Pain Levels (6-10/10)  Emergency Room Pain 6 Severely limiting. Requires emergency care and should not be seen or managed at an outpatient pain management facility. Communication becomes difficult and requires great effort. Assistance to reach the emergency department may be required. Facial flushing and profuse sweating along with potentially dangerous increases in heart rate and blood pressure will be evident.   Distressing pain 7 Self-care is very difficult. Assistance is required to transport, or use restroom. Assistance to reach the emergency department will be required. Tasks requiring coordination, such as bathing and getting dressed become very difficult.   Disabling pain 8 Self-care is no longer possible. At this level, pain is disabling. The individual is unable to do even the most "basic" activities such  as walking, eating, bathing, dressing, transferring to a bed, or toileting. Fine motor skills are lost. It is difficult to think clearly.   Incapacitating pain 9 Pain becomes incapacitating. Thought processing is no longer possible. Difficult to remember your own name. Control of movement and coordination are lost.   The worst pain imaginable 10 At this level, most patients pass out from pain. When this level is reached, collapse of the autonomic nervous system occurs, leading to a sudden drop in blood pressure and heart rate. This in turn results in a temporary and dramatic drop in blood flow to the brain, leading to a loss of consciousness. Fainting is one of the body's self defense mechanisms. Passing out puts the brain in a calmed state and causes it to shut down for a while, in order to begin the healing process.    Summary: 1. Refer to this scale when providing us with your pain level. 2. Be accurate and careful when reporting your pain level. This will help with your care. 3. Over-reporting your pain level will lead to loss of credibility. 4. Even a level of 1/10 means that there is pain and will be treated at our facility. 5. High, inaccurate reporting will be documented as "Symptom Exaggeration", leading to loss of credibility and suspicions of possible secondary gains such as obtaining more narcotics, or wanting to appear disabled, for fraudulent reasons. 6. Only pain levels of 5 or below will be seen at our facility. 7. Pain levels of 6 and above will be sent to the   Emergency Department and the appointment cancelled. _____________________________________________________________________________________________  Pain Management Discharge Instructions  General Discharge Instructions :  If you need to reach your doctor call: Monday-Friday 8:00 am - 4:00 pm at [phone] or toll free [phone].  After clinic hours [phone] to have operator reach doctor.  Bring all of your  medication bottles to all your appointments in the pain clinic.  To cancel or reschedule your appointment with Pain Management please remember to call 24 hours in advance to avoid a fee.  Refer to the educational materials which you have been given on: General Risks, I had my Procedure. Discharge Instructions, Post Sedation.  Post Procedure Instructions:  The drugs you were given will stay in your system until tomorrow, so for the next 24 hours you should not drive, make any legal decisions or drink any alcoholic beverages.  You may eat anything you prefer, but it is better to start with liquids then soups and crackers, and gradually work up to solid foods.  Please notify your doctor immediately if you have any unusual bleeding, trouble breathing or pain that is not related to your normal pain.  Depending on the type of procedure that was done, some parts of your body may feel week and/or numb.  This usually clears up by tonight or the next day.  Walk with the use of an assistive device or accompanied by an adult for the 24 hours.  You may use ice on the affected area for the first 24 hours.  Put ice in a Ziploc bag and cover with a towel and place against area 15 minutes on 15 minutes off.  You may switch to heat after 24 hours.GENERAL RISKS AND COMPLICATIONS  What are the risk, side effects and possible complications? Generally speaking, most procedures are safe.  However, with any procedure there are risks, side effects, and the possibility of complications.  The risks and complications are dependent upon the sites that are lesioned, or the type of nerve block to be performed.  The closer the procedure is to the spine, the more serious the risks are.  Great care is taken when placing the radio frequency needles, block needles or lesioning probes, but sometimes complications can occur. 1. Infection: Any time there is an injection through the skin, there is a risk of infection.  This is why  sterile conditions are used for these blocks.  There are four possible types of infection. 1. Localized skin infection. 2. Central Nervous System Infection-This can be in the form of Meningitis, which can be deadly. 3. Epidural Infections-This can be in the form of an epidural abscess, which can cause pressure inside of the spine, causing compression of the spinal cord with subsequent paralysis. This would require an emergency surgery to decompress, and there are no guarantees that the patient would recover from the paralysis. 4. Discitis-This is an infection of the intervertebral discs.  It occurs in about 1% of discography procedures.  It is difficult to treat and it may lead to surgery.        2. Pain: the needles have to go through skin and soft tissues, will cause soreness.       3. Damage to internal structures:  The nerves to be lesioned may be near blood vessels or    other nerves which can be potentially damaged.       4. Bleeding: Bleeding is more common if the patient is taking blood thinners such as  aspirin, Coumadin, Ticiid, Plavix, etc., or  if he/she have some genetic predisposition  such as hemophilia. Bleeding into the spinal canal can cause compression of the spinal  cord with subsequent paralysis.  This would require an emergency surgery to  decompress and there are no guarantees that the patient would recover from the  paralysis.       5. Pneumothorax:  Puncturing of a lung is a possibility, every time a needle is introduced in  the area of the chest or upper back.  Pneumothorax refers to free air around the  collapsed lung(s), inside of the thoracic cavity (chest cavity).  Another two possible  complications related to a similar event would include: Hemothorax and Chylothorax.   These are variations of the Pneumothorax, where instead of air around the collapsed  lung(s), you may have blood or chyle, respectively.       6. Spinal headaches: They may occur with any procedures in the  area of the spine.       7. Persistent CSF (Cerebro-Spinal Fluid) leakage: This is a rare problem, but may occur  with prolonged intrathecal or epidural catheters either due to the formation of a fistulous  track or a dural tear.       8. Nerve damage: By working so close to the spinal cord, there is always a possibility of  nerve damage, which could be as serious as a permanent spinal cord injury with  paralysis.       9. Death:  Although rare, severe deadly allergic reactions known as "Anaphylactic  reaction" can occur to any of the medications used.      10. Worsening of the symptoms:  We can always make thing worse.  What are the chances of something like this happening? Chances of any of this occuring are extremely low.  By statistics, you have more of a chance of getting killed in a motor vehicle accident: while driving to the hospital than any of the above occurring .  Nevertheless, you should be aware that they are possibilities.  In general, it is similar to taking a shower.  Everybody knows that you can slip, hit your head and get killed.  Does that mean that you should not shower again?  Nevertheless always keep in mind that statistics do not mean anything if you happen to be on the wrong side of them.  Even if a procedure has a 1 (one) in a 1,000,000 (million) chance of going wrong, it you happen to be that one..Also, keep in mind that by statistics, you have more of a chance of having something go wrong when taking medications.  Who should not have this procedure? If you are on a blood thinning medication (e.g. Coumadin, Plavix, see list of "Blood Thinners"), or if you have an active infection going on, you should not have the procedure.  If you are taking any blood thinners, please inform your physician.  How should I prepare for this procedure?  Do not eat or drink anything at least six hours prior to the procedure.  Bring a driver with you .  It cannot be a taxi.  Come accompanied  by an adult that can drive you back, and that is strong enough to help you if your legs get weak or numb from the local anesthetic.  Take all of your medicines the morning of the procedure with just enough water to swallow them.  If you have diabetes, make sure that you are scheduled to have your procedure done first thing in  the morning, whenever possible.  If you have diabetes, take only half of your insulin dose and notify our nurse that you have done so as soon as you arrive at the clinic.  If you are diabetic, but only take blood sugar pills (oral hypoglycemic), then do not take them on the morning of your procedure.  You may take them after you have had the procedure.  Do not take aspirin or any aspirin-containing medications, at least eleven (11) days prior to the procedure.  They may prolong bleeding.  Wear loose fitting clothing that may be easy to take off and that you would not mind if it got stained with Betadine or blood.  Do not wear any jewelry or perfume  Remove any nail coloring.  It will interfere with some of our monitoring equipment.  NOTE: Remember that this is not meant to be interpreted as a complete list of all possible complications.  Unforeseen problems may occur.  BLOOD THINNERS The following drugs contain aspirin or other products, which can cause increased bleeding during surgery and should not be taken for 2 weeks prior to and 1 week after surgery.  If you should need take something for relief of minor pain, you may take acetaminophen which is found in Tylenol,m Datril, Anacin-3 and Panadol. It is not blood thinner. The products listed below are.  Do not take any of the products listed below in addition to any listed on your instruction sheet.  A.P.C or A.P.C with Codeine Codeine Phosphate Capsules #3 Ibuprofen Ridaura  ABC compound Congesprin Imuran rimadil  Advil Cope Indocin Robaxisal  Alka-Seltzer Effervescent Pain Reliever and Antacid Coricidin or  Coricidin-D  Indomethacin Rufen  Alka-Seltzer plus Cold Medicine Cosprin Ketoprofen S-A-C Tablets  Anacin Analgesic Tablets or Capsules Coumadin Korlgesic Salflex  Anacin Extra Strength Analgesic tablets or capsules CP-2 Tablets Lanoril Salicylate  Anaprox Cuprimine Capsules Levenox Salocol  Anexsia-D Dalteparin Magan Salsalate  Anodynos Darvon compound Magnesium Salicylate Sine-off  Ansaid Dasin Capsules Magsal Sodium Salicylate  Anturane Depen Capsules Marnal Soma  APF Arthritis pain formula Dewitt's Pills Measurin Stanback  Argesic Dia-Gesic Meclofenamic Sulfinpyrazone  Arthritis Bayer Timed Release Aspirin Diclofenac Meclomen Sulindac  Arthritis pain formula Anacin Dicumarol Medipren Supac  Analgesic (Safety coated) Arthralgen Diffunasal Mefanamic Suprofen  Arthritis Strength Bufferin Dihydrocodeine Mepro Compound Suprol  Arthropan liquid Dopirydamole Methcarbomol with Aspirin Synalgos  ASA tablets/Enseals Disalcid Micrainin Tagament  Ascriptin Doan's Midol Talwin  Ascriptin A/D Dolene Mobidin Tanderil  Ascriptin Extra Strength Dolobid Moblgesic Ticlid  Ascriptin with Codeine Doloprin or Doloprin with Codeine Momentum Tolectin  Asperbuf Duoprin Mono-gesic Trendar  Aspergum Duradyne Motrin or Motrin IB Triminicin  Aspirin plain, buffered or enteric coated Durasal Myochrisine Trigesic  Aspirin Suppositories Easprin Nalfon Trillsate  Aspirin with Codeine Ecotrin Regular or Extra Strength Naprosyn Uracel  Atromid-S Efficin Naproxen Ursinus  Auranofin Capsules Elmiron Neocylate Vanquish  Axotal Emagrin Norgesic Verin  Azathioprine Empirin or Empirin with Codeine Normiflo Vitamin E  Azolid Emprazil Nuprin Voltaren  Bayer Aspirin plain, buffered or children's or timed BC Tablets or powders Encaprin Orgaran Warfarin Sodium  Buff-a-Comp Enoxaparin Orudis Zorpin  Buff-a-Comp with Codeine Equegesic Os-Cal-Gesic   Buffaprin Excedrin plain, buffered or Extra Strength Oxalid   Bufferin  Arthritis Strength Feldene Oxphenbutazone   Bufferin plain or Extra Strength Feldene Capsules Oxycodone with Aspirin   Bufferin with Codeine Fenoprofen Fenoprofen Pabalate or Pabalate-SF   Buffets II Flogesic Panagesic   Buffinol plain or Extra Strength Florinal or Florinal with Codeine Panwarfarin   Buf-Tabs  Flurbiprofen Penicillamine   Butalbital Compound Four-way cold tablets Penicillin   Butazolidin Fragmin Pepto-Bismol   Carbenicillin Geminisyn Percodan   Carna Arthritis Reliever Geopen Persantine   Carprofen Gold's salt Persistin   Chloramphenicol Goody's Phenylbutazone   Chloromycetin Haltrain Piroxlcam   Clmetidine heparin Plaquenil   Cllnoril Hyco-pap Ponstel   Clofibrate Hydroxy chloroquine Propoxyphen         Before stopping any of these medications, be sure to consult the physician who ordered them.  Some, such as Coumadin (Warfarin) are ordered to prevent or treat serious conditions such as "deep thrombosis", "pumonary embolisms", and other heart problems.  The amount of time that you may need off of the medication may also vary with the medication and the reason for which you were taking it.  If you are taking any of these medications, please make sure you notify your pain physician before you undergo any procedures.

## 2016-10-10 LAB — TOXASSURE SELECT 13 (MW), URINE

## 2017-01-04 ENCOUNTER — Encounter: Payer: Medicare HMO | Admitting: Nurse Practitioner

## 2017-01-04 NOTE — Progress Notes (Signed)
Patient's Name: Jennifer Rivers  MRN: [id number]  Referring Provider: Kirk Ruths, MD  DOB: 1946/04/28  PCP: Kirk Ruths, MD  DOS: 01/08/2017  Note by: Vevelyn Francois NP  Service setting: Ambulatory outpatient  Specialty: Interventional Pain Management  Location: [facility] (AMB) Pain Management Facility    Patient type: Established    Primary Reason(s) for Visit: Encounter for prescription drug management. (Level of risk: moderate)  CC: Knee Pain (both) and Shoulder Pain (left )  HPI  Jennifer Rivers is a 71 y.o. year old, female patient, who comes today for a medication management evaluation. 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 Her primarily concern today is the Knee Pain (both) and Shoulder Pain (left )  Pain Assessment: Location: Left, Right Knee Radiating: down both legs to the feet(to the toes) Onset: More than a month ago Duration: Chronic pain Quality: Aching, Constant, Throbbing, Radiating, Sharp, Shooting Severity: 5 /10 (self-reported pain score)  Note: Reported level is compatible with observation.                   Effect on ADL: pace self Timing:  Constant Modifying factors: medicine    Jennifer Rivers was last scheduled for an appointment on 10/05/2016 for medication management. During today's appointment we reviewed Jennifer Rivers's chronic pain status, as well as her outpatient medication regimen. She has occasional numbness, tingling in her legs. She has weakness in her knee. She admits that she is also having swelling. She denies any recent falls or injuries.  She admits that she is having increaed muscle tight and nerve pain. She feels like the nerve pain is worse. She has failed cyclobenzaprine. She has had questionalbe reaction to Gabapentin; oversedation years ago. She denies the use of Lyrica. She would like something to help her. She is not able to get any sleep secondary to "feeeling".   The patient  reports that she does not use drugs. Her body mass index is 27.44 kg/m.  Further details on both, my assessment(s), as well as the proposed treatment plan, please see below.  Controlled Substance Pharmacotherapy Assessment REMS (Risk Evaluation and Mitigation Strategy)  Analgesic:Oxycodone IR 5 mg 1 tablet by mouth every 6 hours (20 mg/dayof oxycodone) MME/day:63m/day. PLona Millard RN  01/08/2017  9:15 AM  Sign at close encounter Nursing Pain Medication Assessment:  Safety precautions to be maintained throughout the outpatient stay will include: orient to surroundings, keep bed in low position, maintain call bell within reach at all times, provide assistance with transfer out of bed and ambulation.  Medication Inspection Compliance: Pill count conducted under aseptic conditions, in front of the patient. Neither the pills nor the bottle was removed from the patient's sight at any time. Once count was completed pills were immediately returned to the patient in their original bottle.  Medication: Oxycodone IR  Pill/Patch Count: 109.5 of 120 pills remain Pill/Patch Appearance: Markings consistent with prescribed medication Bottle  Appearance: Standard pharmacy container. Clearly labeled. Filled Date: 08 / 19 / 2018 Last Medication intake:  Today   Pharmacokinetics: Liberation and absorption (onset of action): WNL Distribution (time to peak effect): WNL Metabolism and excretion (duration of action): WNL         Pharmacodynamics: Desired effects: Analgesia: Jennifer Rivers reports >50% benefit. Functional ability: Patient reports that medication allows her to accomplish basic ADLs Clinically meaningful improvement in function (CMIF): Sustained CMIF goals met Perceived effectiveness: Described as relatively effective, allowing for increase in activities of daily living (ADL) Undesirable effects: Side-effects or Adverse reactions: None reported Monitoring: [place] PMP: Online review of the past 23-monthperiod conducted. Compliant with practice rules and regulations List of all UDS test(s) done:  Lab Results  Component Value Date   TOXASSSELUR FINAL 04/06/2016   SUMMARY FINAL 10/05/2016   SUMMARY FINAL 12/28/2015   Last UDS on record: [employer] Select 13  Date Value Ref Range Status  04/06/2016 FINAL  Final    Comment:    ==================================================================== TOXASSURE SELECT 13 (MW) ==================================================================== Test                             Result       Flag       Units Drug Present and Declared for Prescription Verification   Oxycodone                      821          EXPECTED   ng/mg creat   Oxymorphone                    1518         EXPECTED   ng/mg creat   Noroxycodone                   1696         EXPECTED   ng/mg creat   Noroxymorphone                 296          EXPECTED   ng/mg creat    Sources of oxycodone are scheduled prescription medications.    Oxymorphone, noroxycodone, and noroxymorphone are expected    metabolites of oxycodone. Oxymorphone is also available as a    scheduled prescription  medication. ==================================================================== Test                      Result    Flag   Units      Ref Range   Creatinine              28               mg/dL      >=20 ==================================================================== Declared Medications:  The flagging and interpretation on this report are based on the  following declared medications.  Unexpected results may arise from  inaccuracies in the declared medications.  **Note: The testing scope of this panel includes these medications:  Oxycodone  **Note: The testing scope of this panel does not include following  reported medications:  Albuterol  Carvedilol  Cholecalciferol  Cyanocobalamin  Omeprazole ==================================================================== For clinical consultation, please call [phone] ====================================================================    Summary  Date Value Ref Range Status  10/05/2016 FINAL  Final  Comment:    ==================================================================== TOXASSURE SELECT 13 (MW) ==================================================================== Test                             Result       Flag       Units Drug Present and Declared for Prescription Verification   Oxycodone                      435          EXPECTED   ng/mg creat   Oxymorphone                    165          EXPECTED   ng/mg creat   Noroxycodone                   520          EXPECTED   ng/mg creat    Sources of oxycodone include scheduled prescription medications.    Oxymorphone and noroxycodone are expected metabolites of    oxycodone. Oxymorphone is also available as a scheduled    prescription medication. ==================================================================== Test                      Result    Flag   Units      Ref Range   Creatinine              51               mg/dL       >=20 ==================================================================== Declared Medications:  The flagging and interpretation on this report are based on the  following declared medications.  Unexpected results may arise from  inaccuracies in the declared medications.  **Note: The testing scope of this panel includes these medications:  Oxycodone  **Note: The testing scope of this panel does not include following  reported medications:  Albuterol  Carvedilol  Cholecalciferol  Cyanocobalamin  Cyclobenzaprine  Omeprazole ==================================================================== For clinical consultation, please call [phone]. ====================================================================    UDS interpretation: Compliant          Medication Assessment Form: Reviewed. Patient indicates being compliant with therapy Treatment compliance: Compliant Risk Assessment Profile: Aberrant behavior: See prior evaluations. None observed or detected today Comorbid factors increasing risk of overdose: See prior notes. No additional risks detected today Risk of substance use disorder (SUD): Low     Opioid Risk Tool - 01/08/17 0909      Family History of Substance Abuse   Alcohol Negative   Illegal Drugs Negative   Rx Drugs Negative     Personal History of Substance Abuse   Alcohol Negative   Illegal Drugs Negative   Rx Drugs Negative     Age   Age between 98-45 years  No     History of Preadolescent Sexual Abuse   History of Preadolescent Sexual Abuse Negative or Female     Psychological Disease   Psychological Disease Negative   Depression Negative     Total Score   Opioid Risk Tool Scoring 0   Opioid Risk Interpretation Low Risk     ORT Scoring interpretation table:  Score <3 = Low Risk for SUD  Score between 4-7 = Moderate Risk for SUD  Score >8 = High Risk for Opioid Abuse   Risk Mitigation Strategies:  Patient Counseling:  Covered Patient-Prescriber Agreement (PPA): Present  and active  Notification to other healthcare providers: Done  Pharmacologic Plan: No change in therapy, at this time  Laboratory Chemistry  Inflammation Markers (CRP: Acute Phase) (ESR: Chronic Phase) Lab Results  Component Value Date   CRP 3.0 (H) 12/28/2015   ESRSEDRATE 55 (H) 12/28/2015                 Renal Function Markers Lab Results  Component Value Date   BUN 18 12/28/2015   CREATININE 1.15 (H) 12/28/2015   GFRAA 56 (L) 12/28/2015   GFRNONAA 48 (L) 12/28/2015                 Hepatic Function Markers Lab Results  Component Value Date   AST 24 12/28/2015   ALT 17 12/28/2015   ALBUMIN 4.2 12/28/2015   ALKPHOS 115 12/28/2015                 Electrolytes Lab Results  Component Value Date   NA 134 (L) 12/28/2015   K 4.3 12/28/2015   CL 99 (L) 12/28/2015   CALCIUM 9.8 12/28/2015   MG 1.9 12/28/2015                 Neuropathy Markers Lab Results  Component Value Date   VITAMINB12 176 (L) 12/28/2015                 Bone Pathology Markers Lab Results  Component Value Date   ALKPHOS 115 12/28/2015   25OHVITD1 24 (L) 12/28/2015   25OHVITD2 2.5 12/28/2015   25OHVITD3 21 12/28/2015   CALCIUM 9.8 12/28/2015                 Coagulation Parameters Lab Results  Component Value Date   PLT 444 (H) 09/06/2015                 Cardiovascular Markers Lab Results  Component Value Date   HGB 9.5 (L) 09/06/2015   HCT 29.5 (L) 09/06/2015                 Note: Lab results reviewed.  Recent Diagnostic Imaging Review  Mr Lumbar Spine Wo Contrast  Result Date: 08/10/2016 CLINICAL DATA:  Chronic low back pain.  Left hip pain arthralgia EXAM: MRI LUMBAR SPINE WITHOUT CONTRAST TECHNIQUE: Multiplanar, multisequence MR imaging of the lumbar spine was performed. No intravenous contrast was administered. COMPARISON:  Lumbar MRI 11/24/2010 FINDINGS: Segmentation:  Normal Alignment:  Mild retrolisthesis L3-4 L4-5.  Vertebrae:  Negative for fracture or mass. Conus medullaris: Extends to the L1-2 level and appears normal. Paraspinal and other soft tissues: Negative Disc levels: T12-L1:  Negative L1-2:  Negative L2-3: Mild disc degeneration and disc bulging. No significant stenosis. L3-4: Disc degeneration with disc bulging. Small left paracentral disc protrusion unchanged. Progressive spurring on the left causing mild foraminal encroachment. Subarticular stenosis on the left. Mild spinal stenosis with mild progression. L4-5: Moderate disc degeneration with disc space narrowing and diffuse endplate spurring. Spurring is more prominent right greater than the left and is similar to the prior study. Subarticular stenosis on the right is unchanged. No significant canal stenosis. Mild right foraminal narrowing L5-S1: Disc degeneration and spurring on the right causing mild right foraminal narrowing, similar to the prior study. Right-sided facet hypertrophy. IMPRESSION: Small left paracentral disc protrusion L3-4 unchanged. Progressive spurring on the left. Subarticular stenosis on the left appears to have progressed and may be causing impingement of the left L4 nerve root. Mild progression of mild spinal stenosis  Progressive spurring on the right at L4-5 with subarticular stenosis on the right. Mild right foraminal narrowing L5-S1 unchanged. Electronically Signed   By: Franchot Gallo M.D.   On: 08/10/2016 11:39   Note: Imaging results reviewed.          Meds   Current Meds  Medication Sig  . albuterol (VENTOLIN HFA) 108 (90 BASE) MCG/ACT inhaler Take 2 puffs by mouth every 6 (six) hours as needed.  . carvedilol (COREG) 12.5 MG tablet 12.5 mg 2 (two) times daily.   . Cholecalciferol (VITAMIN D-1000 MAX ST) 1000 units tablet Take by mouth daily.   . Cyanocobalamin (RA VITAMIN B-12 TR) 1000 MCG TBCR Take 1,000 mcg by mouth daily.   Marland Kitchen omeprazole (PRILOSEC) 20 MG capsule Take 20 mg by mouth daily.     ROS  Constitutional:  Denies any fever or chills Gastrointestinal: No reported hemesis, hematochezia, vomiting, or acute GI distress Musculoskeletal: Denies any acute onset joint swelling, redness, loss of ROM, or weakness Neurological: No reported episodes of acute onset apraxia, aphasia, dysarthria, agnosia, amnesia, paralysis, loss of coordination, or loss of consciousness  Allergies  Jennifer Rivers is allergic to bupropion; codeine; gabapentin; tramadol; penicillins; and sulfa antibiotics.  [place]  Drug: Jennifer Rivers  reports that she does not use drugs. Alcohol:  reports that she does not drink alcohol. Tobacco:  reports that she has been smoking Cigarettes.  She has been smoking about 0.50 packs per day. She has never used smokeless tobacco. Medical:  has a past medical history of Asthma; GERD (gastroesophageal reflux disease); Hypertension; MRSA (methicillin resistant staph aureus) culture positive (08/2015); Multilevel degenerative disc disease; Osteoarthritis; and Osteoporosis. Surgical: Jennifer Rivers  has a past surgical history that includes Abdominal hysterectomy; Tonsillectomy; and Knee arthroscopy (Right, 09/15/2015). Family: family history includes Diabetes in her mother; Heart attack in her father.  Constitutional Exam  General appearance: Well nourished, well developed, and well hydrated. In no apparent acute distress Vitals:   01/08/17 0902  BP: (!) 149/91  Pulse: 97  Resp: 16  Temp: 98.4 F (36.9 C)  SpO2: 97%  Weight: 150 lb (68 kg)  Height: 5' 2"  (1.575 m)   BMI Assessment: Estimated body mass index is 27.44 kg/m as calculated from the following:   Height as of this encounter: 5' 2"  (1.575 m).   Weight as of this encounter: 150 lb (68 kg).  Psych/Mental status: Alert, oriented x 3 (person, place, & time)       Eyes: PERLA Respiratory: No evidence of acute respiratory distress  Gait & Posture Assessment  Ambulation: Unassisted Gait: Relatively normal for age and body habitus Posture: WNL    Lower Extremity Exam    Side: Right lower extremity,  Side: Left lower extremity   Skin & Extremity Inspection: Skin color, temperature, and hair growth are WNL. No peripheral edema or cyanosis,  skin lesions. No contractures.  Skin & Extremity Inspection: Skin color, temperature, and hair growth are WNL. No peripheral edema or cyanosis, skin lesions. No contractures.  Functional ROM: Decreased ROM for knee joint crepitus  Functional ROM: Decreased ROM for knee joint crepitus   Muscle Tone/Strength: Functionally intact. No obvious neuro-muscular anomalies detected.  Muscle Tone/Strength: Functionally intact. No obvious neuro-muscular anomalies detected.   Sensory (Neurological): Unimpaired  Sensory (Neurological): Unimpaired  Palpation: Complains of area being tender to palpation  Palpation: No palpable anomalies   Assessment  Primary Diagnosis & Pertinent Problem List: The primary encounter diagnosis was Chronic knee pain (Location of Primary  Source of Pain) (Bilateral) (R>L). Diagnoses of Chronic Neurogenic pain, Chronic lower extremity pain (Bilateral) (R>L), and Chronic pain syndrome were also pertinent to this visit.  Status Diagnosis  Controlled Controlled Controlled 1. Chronic knee pain (Location of Primary Source of Pain) (Bilateral) (R>L)   2. Chronic Neurogenic pain   3. Chronic lower extremity pain (Bilateral) (R>L)   4. Chronic pain syndrome     Problems updated and reviewed during this visit: Problem  Chronic Neurogenic pain   Plan of Care  Pharmacotherapy (Medications Ordered): Meds ordered this encounter  Medications  . oxyCODONE (OXY IR/ROXICODONE) 5 MG immediate release tablet    Sig: Take 1 tablet (5 mg total) by mouth every 6 (six) hours as needed for severe pain.    Dispense:  120 tablet    Refill:  0    Do not place this medication, or any other prescription from our practice, on "Automatic Refill". Patient may have prescription filled one day early if  pharmacy is closed on scheduled refill date. Do not fill until: 03/05/2017 To last until: 04/04/2017    Order Specific Question:   Supervising Provider    Answer:   Milinda Pointer [phone]  . oxyCODONE (OXY IR/ROXICODONE) 5 MG immediate release tablet    Sig: Take 1 tablet (5 mg total) by mouth every 6 (six) hours as needed for severe pain.    Dispense:  120 tablet    Refill:  0    Do not place this medication, or any other prescription from our practice, on "Automatic Refill". Patient may have prescription filled one day early if pharmacy is closed on scheduled refill date. Do not fill until: 04/04/2017 To last until: 05/04/2017    Order Specific Question:   Supervising Provider    Answer:   Milinda Pointer [phone]  . oxyCODONE (OXY IR/ROXICODONE) 5 MG immediate release tablet    Sig: Take 1 tablet (5 mg total) by mouth every 6 (six) hours as needed for severe pain.    Dispense:  120 tablet    Refill:  0    Do not place this medication, or any other prescription from our practice, on "Automatic Refill". Patient may have prescription filled one day early if pharmacy is closed on scheduled refill date. Do not fill until: 02/03/2017 To last until:03/05/2017    Order Specific Question:   Supervising Provider    Answer:   Milinda Pointer [phone]  . pregabalin (LYRICA) 25 MG capsule    Sig: Take 1 capsule (25 mg total) by mouth 3 (three) times daily.    Dispense:  90 capsule    Refill:  0    Do not place this medication, or any other prescription from our practice, on "Automatic Refill". Patient may have prescription filled one day early if pharmacy is closed on scheduled refill date.    Order Specific Question:   Supervising Provider    Answer:   Milinda Pointer [phone]   New Prescriptions   PREGABALIN (LYRICA) 25 MG CAPSULE    Take 1 capsule (25 mg total) by mouth 3 (three) times daily.   Medications administered today: Jennifer Rivers had no medications administered during  this visit. Lab-work, procedure(s), and/or referral(s): Orders Placed This Encounter  Procedures  . KNEE INJECTION   Imaging and/or referral(s): None  Interventional therapies: Planned, scheduled, and/or pending:   Right/bilateral intra-articular knee injection with local anesthetic and steroid   Considering:   Left cervical epidural steroid injection #2 Diagnostic bilateral intra-articular knee injection Possible  series of 5 bilateral [place] intra-articular knee injections Diagnostic bilateral genicular nerve blocks  Possible bilateral genicular nerve radiofrequency ablation Diagnostic bilateral lumbar facet block  Possible bilateral lumbar facet radiofrequency ablation  Diagnostic bilateral intra-articular shoulder joint injection  Diagnostic bilateral suprascapular nerve block  Possible bilateral suprascapular nerve radiofrequency ablation  Diagnostic right-sided L5-S1 transforaminal epidural steroid injection Diagnostic left sided S1 transforaminal epidural steroid injection Diagnostic right-sided L4-5 lumbar epidural steroid injection    Palliative PRN treatment(s):   Palliative bilateral intra-articular knee injection with local anesthetic and steroid. Left cervical epidural steroid injection #2   Provider-requested follow-up: Return in about 3 months (around 04/10/2017) for MedMgmt, w/ Dr. Dossie Arbour, Procedure(NS).  Future Appointments Date Time Provider [place]  04/09/2017 1:30 PM Milinda Pointer, MD [facility] None   Primary Care Physician: Kirk Ruths, MD Location: [facility] Out[facility] Management Facility Note by: Vevelyn Francois NP Date: 01/08/2017; Time: 10:49 AM  Pain Score Disclaimer: We use the NRS-11 scale. This is a self-reported, subjective measurement of pain severity with only modest accuracy. It is used primarily to identify changes within a particular patient. It must be understood that outpatient pain scales are significantly  less accurate that those used for research, where they can be applied under ideal controlled circumstances with minimal exposure to variables. In reality, the score is likely to be a combination of pain intensity and pain affect, where pain affect describes the degree of emotional arousal or changes in action readiness caused by the sensory experience of pain. Factors such as social and work situation, setting, emotional state, anxiety levels, expectation, and prior pain experience may influence pain perception and show large inter-individual differences that may also be affected by time variables.  Patient instructions provided during this appointment: Patient Instructions    ____________________________________________________________________________________________  Medication Rules  Applies to: All patients receiving prescriptions (written or electronic).  Pharmacy of record: Pharmacy where electronic prescriptions will be sent. If written prescriptions are taken to a different pharmacy, please inform the nursing staff. The pharmacy listed in the electronic medical record should be the one where you would like electronic prescriptions to be sent.  Prescription refills: Only during scheduled appointments. Applies to both, written and electronic prescriptions.  NOTE: The following applies primarily to controlled substances (Opioid* Pain Medications).   Patient's responsibilities: 1. Pain Pills: Bring all pain pills to every appointment (except for procedure appointments). 2. Pill Bottles: Bring pills in original pharmacy bottle. Always bring newest bottle. Bring bottle, even if empty. 3. Medication refills: You are responsible for knowing and keeping track of what medications you need refilled. The day before your appointment, write a list of all prescriptions that need to be refilled. Bring that list to your appointment and give it to the admitting nurse. Prescriptions will be written only  during appointments. If you forget a medication, it will not be "Called in", "Faxed", or "electronically sent". You will need to get another appointment to get these prescribed. 4. Prescription Accuracy: You are responsible for carefully inspecting your prescriptions before leaving our office. Have the discharge nurse carefully go over each prescription with you, before taking them home. Make sure that your name is accurately spelled, that your address is correct. Check the name and dose of your medication to make sure it is accurate. Check the number of pills, and the written instructions to make sure they are clear and accurate. Make sure that you are given enough medication to last until your next medication refill appointment. 5.  Taking Medication: Take medication as prescribed. Never take more pills than instructed. Never take medication more frequently than prescribed. Taking less pills or less frequently is permitted and encouraged, when it comes to controlled substances (written prescriptions).  6. Inform other Doctors: Always inform, all of your healthcare providers, of all the medications you take. 7. Pain Medication from other Providers: You are not allowed to accept any additional pain medication from any other Doctor or Healthcare provider. There are two exceptions to this rule. (see below) In the event that you require additional pain medication, you are responsible for notifying us, as stated below. 8. Medication Agreement: You are responsible for carefully reading and following our Medication Agreement. This must be signed before receiving any prescriptions from our practice. Safely store a copy of your signed Agreement. Violations to the Agreement will result in no further prescriptions. (Additional copies of our Medication Agreement are available upon request.) 9. Laws, Rules, & Regulations: All patients are expected to follow all Federal and [employer], [employer], Rules, [employer].  Ignorance of the Laws does not constitute a valid excuse. The use of any illegal substances is prohibited. 10. Adopted CDC guidelines & recommendations: Target dosing levels will be at or below 60 MME/day. Use of benzodiazepines** is not recommended.  Exceptions: There are only two exceptions to the rule of not receiving pain medications from other Healthcare Providers. 1. Exception #1 (Emergencies): In the event of an emergency (i.e.: accident requiring emergency care), you are allowed to receive additional pain medication. However, you are responsible for: As soon as you are able, call our office [phone], at any time of the day or night, and leave a message stating your name, the date and nature of the emergency, and the name and dose of the medication prescribed. In the event that your call is answered by a member of our staff, make sure to document and save the date, time, and the name of the person that took your information.  2. Exception #2 (Planned Surgery): In the event that you are scheduled by another doctor or dentist to have any type of surgery or procedure, you are allowed (for a period no longer than 30 days), to receive additional pain medication, for the acute post-op pain. However, in this case, you are responsible for picking up a copy of our "Post-op Pain Management for Surgeons" handout, and giving it to your surgeon or dentist. This document is available at our office, and does not require an appointment to obtain it. Simply go to our office during business hours (Monday-Thursday from 8:00 AM to 4:00 PM) (Friday 8:00 AM to 12:00 Noon) or if you have a scheduled appointment with [place], prior to your surgery, and ask for it by name. In addition, you will need to provide [place] with your name, name of your surgeon, type of surgery, and date of procedure or surgery.  *Opioid medications include: morphine, codeine, oxycodone, oxymorphone, hydrocodone, hydromorphone, meperidine, tramadol,  tapentadol, buprenorphine, fentanyl, methadone. **Benzodiazepine medications include: diazepam (Valium), alprazolam (Xanax), clonazepam (Klonopine), lorazepam (Ativan), clorazepate (Tranxene), chlordiazepoxide (Librium), estazolam (Prosom), oxazepam (Serax), temazepam (Restoril), triazolam (Halcion)  ____________________________________________________________________________________________ BMI Assessment: Estimated body mass index is 27.44 kg/m as calculated from the following:   Height as of this encounter: 5' 2"  (1.575 m).   Weight as of this encounter: 150 lb (68 kg).  BMI interpretation table: BMI level Category Range association with higher incidence of chronic pain  <18 kg/m2 Underweight   18.5-24.9 kg/m2 Ideal body weight  25-29.9 kg/m2 Overweight Increased incidence by 20%  30-34.9 kg/m2 Obese (Class I) Increased incidence by 68%  35-39.9 kg/m2 Severe obesity (Class II) Increased incidence by 136%  >40 kg/m2 Extreme obesity (Class III) Increased incidence by 254%   BMI Readings from Last 4 Encounters:  01/08/17 27.44 kg/m  10/05/16 27.44 kg/m  07/17/16 27.44 kg/m  07/12/16 27.25 kg/m   Wt Readings from Last 4 Encounters:  01/08/17 150 lb (68 kg)  10/05/16 150 lb (68 kg)  07/17/16 150 lb (68 kg)  07/12/16 149 lb (67.6 kg)  Pain Management Discharge Instructions  General Discharge Instructions :  If you need to reach your doctor call: Monday-Friday 8:00 am - 4:00 pm at [phone] or toll free [phone].  After clinic hours [phone] to have operator reach doctor.  Bring all of your medication bottles to all your appointments in the pain clinic.  To cancel or reschedule your appointment with Pain Management please remember to call 24 hours in advance to avoid a fee.  Refer to the educational materials which you have been given on: General Risks, I had my Procedure. Discharge Instructions, Post Sedation.  Post Procedure Instructions:  The drugs you were  given will stay in your system until tomorrow, so for the next 24 hours you should not drive, make any legal decisions or drink any alcoholic beverages.  You may eat anything you prefer, but it is better to start with liquids then soups and crackers, and gradually work up to solid foods.  Please notify your doctor immediately if you have any unusual bleeding, trouble breathing or pain that is not related to your normal pain.  Depending on the type of procedure that was done, some parts of your body may feel week and/or numb.  This usually clears up by tonight or the next day.  Walk with the use of an assistive device or accompanied by an adult for the 24 hours.  You may use ice on the affected area for the first 24 hours.  Put ice in a Ziploc bag and cover with a towel and place against area 15 minutes on 15 minutes off.  You may switch to heat after 24 hours. Knee Injection A knee injection is a procedure to get medicine into your knee joint. Your health care provider puts a needle into the joint and injects medicine with an attached syringe. The injected medicine may relieve the pain, swelling, and stiffness of arthritis. The injected medicine may also help to lubricate and cushion your knee joint. You may need more than one injection. Tell a health care provider about:  Any allergies you have.  All medicines you are taking, including vitamins, herbs, eye drops, creams, and over-the-counter medicines.  Any problems you or family members have had with anesthetic medicines.  Any blood disorders you have.  Any surgeries you have had.  Any medical conditions you have. What are the risks? Generally, this is a safe procedure. However, problems may occur, including:  Infection.  Bleeding.  Worsening symptoms.  Damage to the area around your knee.  Allergic reaction to any of the medicines.  Skin reactions from repeated injections.  What happens before the procedure?  Ask your  health care provider about changing or stopping your regular medicines. This is especially important if you are taking diabetes medicines or blood thinners.  Plan to have someone take you home after the procedure. What happens during the procedure?  You will sit or lie down in a position for  your knee to be treated.  The skin over your kneecap will be cleaned with a germ-killing solution (antiseptic).  You will be given a medicine that numbs the area (local anesthetic). You may feel some stinging.  After your knee becomes numb, you will have a second injection. This is the medicine. This needle is carefully placed between your kneecap and your knee. The medicine is injected into the joint space.  At the end of the procedure, the needle will be removed.  A bandage (dressing) may be placed over the injection site. The procedure may vary among health care providers and hospitals. What happens after the procedure?  You may have to move your knee through its full range of motion. This helps to get all of the medicine into your joint space.  Your blood pressure, heart rate, breathing rate, and blood oxygen level will be monitored often until the medicines you were given have worn off.  You will be watched to make sure that you do not have a reaction to the injected medicine. This information is not intended to replace advice given to you by your health care provider. Make sure you discuss any questions you have with your health care provider. Document Released: 07/30/2006 Document Revised: 10/08/2015 Document Reviewed: 03/18/2014 Elsevier Interactive Patient Education  2018 [employer].

## 2017-01-08 ENCOUNTER — Encounter: Payer: Self-pay | Admitting: Nurse Practitioner

## 2017-01-08 ENCOUNTER — Ambulatory Visit: Payer: Medicare HMO | Attending: Nurse Practitioner | Admitting: Nurse Practitioner

## 2017-01-08 VITALS — BP 149/91 | HR 97 | Temp 98.4°F | Resp 16 | Ht 62.0 in | Wt 150.0 lb

## 2017-01-08 DIAGNOSIS — M48061 Spinal stenosis, lumbar region without neurogenic claudication: Secondary | ICD-10-CM | POA: Insufficient documentation

## 2017-01-08 DIAGNOSIS — Z882 Allergy status to sulfonamides status: Secondary | ICD-10-CM | POA: Diagnosis not present

## 2017-01-08 DIAGNOSIS — M19012 Primary osteoarthritis, left shoulder: Secondary | ICD-10-CM | POA: Diagnosis not present

## 2017-01-08 DIAGNOSIS — M25561 Pain in right knee: Secondary | ICD-10-CM | POA: Insufficient documentation

## 2017-01-08 DIAGNOSIS — G894 Chronic pain syndrome: Secondary | ICD-10-CM | POA: Insufficient documentation

## 2017-01-08 DIAGNOSIS — I10 Essential (primary) hypertension: Secondary | ICD-10-CM | POA: Insufficient documentation

## 2017-01-08 DIAGNOSIS — M79604 Pain in right leg: Secondary | ICD-10-CM | POA: Diagnosis not present

## 2017-01-08 DIAGNOSIS — K219 Gastro-esophageal reflux disease without esophagitis: Secondary | ICD-10-CM | POA: Diagnosis not present

## 2017-01-08 DIAGNOSIS — M792 Neuralgia and neuritis, unspecified: Secondary | ICD-10-CM | POA: Diagnosis not present

## 2017-01-08 DIAGNOSIS — G8929 Other chronic pain: Secondary | ICD-10-CM

## 2017-01-08 DIAGNOSIS — M4726 Other spondylosis with radiculopathy, lumbar region: Secondary | ICD-10-CM | POA: Diagnosis not present

## 2017-01-08 DIAGNOSIS — M79605 Pain in left leg: Secondary | ICD-10-CM | POA: Insufficient documentation

## 2017-01-08 DIAGNOSIS — M5412 Radiculopathy, cervical region: Secondary | ICD-10-CM | POA: Diagnosis not present

## 2017-01-08 DIAGNOSIS — Z79899 Other long term (current) drug therapy: Secondary | ICD-10-CM | POA: Insufficient documentation

## 2017-01-08 DIAGNOSIS — Z8249 Family history of ischemic heart disease and other diseases of the circulatory system: Secondary | ICD-10-CM | POA: Insufficient documentation

## 2017-01-08 DIAGNOSIS — M170 Bilateral primary osteoarthritis of knee: Secondary | ICD-10-CM | POA: Diagnosis not present

## 2017-01-08 DIAGNOSIS — M19011 Primary osteoarthritis, right shoulder: Secondary | ICD-10-CM | POA: Diagnosis not present

## 2017-01-08 DIAGNOSIS — Z885 Allergy status to narcotic agent status: Secondary | ICD-10-CM | POA: Insufficient documentation

## 2017-01-08 DIAGNOSIS — F17210 Nicotine dependence, cigarettes, uncomplicated: Secondary | ICD-10-CM | POA: Diagnosis not present

## 2017-01-08 DIAGNOSIS — Z880 Allergy status to penicillin: Secondary | ICD-10-CM | POA: Diagnosis not present

## 2017-01-08 DIAGNOSIS — Z833 Family history of diabetes mellitus: Secondary | ICD-10-CM | POA: Insufficient documentation

## 2017-01-08 DIAGNOSIS — J45909 Unspecified asthma, uncomplicated: Secondary | ICD-10-CM | POA: Diagnosis not present

## 2017-01-08 DIAGNOSIS — M1990 Unspecified osteoarthritis, unspecified site: Secondary | ICD-10-CM | POA: Diagnosis not present

## 2017-01-08 DIAGNOSIS — M160 Bilateral primary osteoarthritis of hip: Secondary | ICD-10-CM | POA: Insufficient documentation

## 2017-01-08 DIAGNOSIS — M25562 Pain in left knee: Secondary | ICD-10-CM | POA: Diagnosis not present

## 2017-01-08 DIAGNOSIS — M488X7 Other specified spondylopathies, lumbosacral region: Secondary | ICD-10-CM | POA: Insufficient documentation

## 2017-01-08 DIAGNOSIS — M810 Age-related osteoporosis without current pathological fracture: Secondary | ICD-10-CM | POA: Diagnosis not present

## 2017-01-08 MED ORDER — PREGABALIN 25 MG PO CAPS: 25.0000 mg | ORAL_CAPSULE | Freq: Three times a day (TID) | ORAL | 0 refills | Status: DC

## 2017-01-08 MED ORDER — OXYCODONE HCL 5 MG PO TABS: 5.0000 mg | ORAL_TABLET | Freq: Four times a day (QID) | ORAL | 0 refills | Status: DC | PRN

## 2017-01-08 NOTE — Progress Notes (Signed)
Nursing Pain Medication Assessment:  Safety precautions to be maintained throughout the outpatient stay will include: orient to surroundings, keep bed in low position, maintain call bell within reach at all times, provide assistance with transfer out of bed and ambulation.  Medication Inspection Compliance: Pill count conducted under aseptic conditions, in front of the patient. Neither the pills nor the bottle was removed from the patient's sight at any time. Once count was completed pills were immediately returned to the patient in their original bottle.  Medication: Oxycodone IR Pill/Patch Count: 109.5 of 120 pills remain Pill/Patch Appearance: Markings consistent with prescribed medication Bottle Appearance: Standard pharmacy container. Clearly labeled. Filled Date: 08 / 19 / 2018 Last Medication intake:  Today

## 2017-01-08 NOTE — Patient Instructions (Addendum)
____________________________________________________________________________________________  Medication Rules  Applies to: All patients receiving prescriptions (written or electronic).  Pharmacy of record: Pharmacy where electronic prescriptions will be sent. If written prescriptions are taken to a different pharmacy, please inform the nursing staff. The pharmacy listed in the electronic medical record should be the one where you would like electronic prescriptions to be sent.  Prescription refills: Only during scheduled appointments. Applies to both, written and electronic prescriptions.  NOTE: The following applies primarily to controlled substances (Opioid* Pain Medications).   Patient's responsibilities: 1. Pain Pills: Bring all pain pills to every appointment (except for procedure appointments). 2. Pill Bottles: Bring pills in original pharmacy bottle. Always bring newest bottle. Bring bottle, even if empty. 3. Medication refills: You are responsible for knowing and keeping track of what medications you need refilled. The day before your appointment, write a list of all prescriptions that need to be refilled. Bring that list to your appointment and give it to the admitting nurse. Prescriptions will be written only during appointments. If you forget a medication, it will not be "Called in", "Faxed", or "electronically sent". You will need to get another appointment to get these prescribed. 4. Prescription Accuracy: You are responsible for carefully inspecting your prescriptions before leaving our office. Have the discharge nurse carefully go over each prescription with you, before taking them home. Make sure that your name is accurately spelled, that your address is correct. Check the name and dose of your medication to make sure it is accurate. Check the number of pills, and the written instructions to make sure they are clear and accurate. Make sure that you are given enough medication to  last until your next medication refill appointment. 5. Taking Medication: Take medication as prescribed. Never take more pills than instructed. Never take medication more frequently than prescribed. Taking less pills or less frequently is permitted and encouraged, when it comes to controlled substances (written prescriptions).  6. Inform other Doctors: Always inform, all of your healthcare providers, of all the medications you take. 7. Pain Medication from other Providers: You are not allowed to accept any additional pain medication from any other Doctor or Healthcare provider. There are two exceptions to this rule. (see below) In the event that you require additional pain medication, you are responsible for notifying us, as stated below. 8. Medication Agreement: You are responsible for carefully reading and following our Medication Agreement. This must be signed before receiving any prescriptions from our practice. Safely store a copy of your signed Agreement. Violations to the Agreement will result in no further prescriptions. (Additional copies of our Medication Agreement are available upon request.) 9. Laws, Rules, & Regulations: All patients are expected to follow all Federal and State Laws, Statutes, Rules, & Regulations. Ignorance of the Laws does not constitute a valid excuse. The use of any illegal substances is prohibited. 10. Adopted CDC guidelines & recommendations: Target dosing levels will be at or below 60 MME/day. Use of benzodiazepines** is not recommended.  Exceptions: There are only two exceptions to the rule of not receiving pain medications from other Healthcare Providers. 1. Exception #1 (Emergencies): In the event of an emergency (i.e.: accident requiring emergency care), you are allowed to receive additional pain medication. However, you are responsible for: As soon as you are able, call our office [phone], at any time of the day or night, and leave a message stating your  name, the date and nature of the emergency, and the name and dose of the medication   prescribed. In the event that your call is answered by a member of our staff, make sure to document and save the date, time, and the name of the person that took your information.  2. Exception #2 (Planned Surgery): In the event that you are scheduled by another doctor or dentist to have any type of surgery or procedure, you are allowed (for a period no longer than 30 days), to receive additional pain medication, for the acute post-op pain. However, in this case, you are responsible for picking up a copy of our "Post-op Pain Management for Surgeons" handout, and giving it to your surgeon or dentist. This document is available at our office, and does not require an appointment to obtain it. Simply go to our office during business hours (Monday-Thursday from 8:00 AM to 4:00 PM) (Friday 8:00 AM to 12:00 Noon) or if you have a scheduled appointment with [place], prior to your surgery, and ask for it by name. In addition, you will need to provide [place] with your name, name of your surgeon, type of surgery, and date of procedure or surgery.  *Opioid medications include: morphine, codeine, oxycodone, oxymorphone, hydrocodone, hydromorphone, meperidine, tramadol, tapentadol, buprenorphine, fentanyl, methadone. **Benzodiazepine medications include: diazepam (Valium), alprazolam (Xanax), clonazepam (Klonopine), lorazepam (Ativan), clorazepate (Tranxene), chlordiazepoxide (Librium), estazolam (Prosom), oxazepam (Serax), temazepam (Restoril), triazolam (Halcion)  ____________________________________________________________________________________________ BMI Assessment: Estimated body mass index is 27.44 kg/m as calculated from the following:   Height as of this encounter: 5\' 2"  (1.575 m).   Weight as of this encounter: 150 lb (68 kg).  BMI interpretation table: BMI level Category Range association with higher incidence of chronic pain   <18 kg/m2 Underweight   18.5-24.9 kg/m2 Ideal body weight   25-29.9 kg/m2 Overweight Increased incidence by 20%  30-34.9 kg/m2 Obese (Class I) Increased incidence by 68%  35-39.9 kg/m2 Severe obesity (Class II) Increased incidence by 136%  >40 kg/m2 Extreme obesity (Class III) Increased incidence by 254%   BMI Readings from Last 4 Encounters:  01/08/17 27.44 kg/m  10/05/16 27.44 kg/m  07/17/16 27.44 kg/m  07/12/16 27.25 kg/m   Wt Readings from Last 4 Encounters:  01/08/17 150 lb (68 kg)  10/05/16 150 lb (68 kg)  07/17/16 150 lb (68 kg)  07/12/16 149 lb (67.6 kg)  Pain Management Discharge Instructions  General Discharge Instructions :  If you need to reach your doctor call: Monday-Friday 8:00 am - 4:00 pm at [phone] or toll free [phone].  After clinic hours [phone] to have operator reach doctor.  Bring all of your medication bottles to all your appointments in the pain clinic.  To cancel or reschedule your appointment with Pain Management please remember to call 24 hours in advance to avoid a fee.  Refer to the educational materials which you have been given on: General Risks, I had my Procedure. Discharge Instructions, Post Sedation.  Post Procedure Instructions:  The drugs you were given will stay in your system until tomorrow, so for the next 24 hours you should not drive, make any legal decisions or drink any alcoholic beverages.  You may eat anything you prefer, but it is better to start with liquids then soups and crackers, and gradually work up to solid foods.  Please notify your doctor immediately if you have any unusual bleeding, trouble breathing or pain that is not related to your normal pain.  Depending on the type of procedure that was done, some parts of your body may feel week and/or numb.  This usually clears  up by tonight or the next day.  Walk with the use of an assistive device or accompanied by an adult for the 24 hours.  You may  use ice on the affected area for the first 24 hours.  Put ice in a Ziploc bag and cover with a towel and place against area 15 minutes on 15 minutes off.  You may switch to heat after 24 hours. Knee Injection A knee injection is a procedure to get medicine into your knee joint. Your health care provider puts a needle into the joint and injects medicine with an attached syringe. The injected medicine may relieve the pain, swelling, and stiffness of arthritis. The injected medicine may also help to lubricate and cushion your knee joint. You may need more than one injection. Tell a health care provider about:  Any allergies you have.  All medicines you are taking, including vitamins, herbs, eye drops, creams, and over-the-counter medicines.  Any problems you or family members have had with anesthetic medicines.  Any blood disorders you have.  Any surgeries you have had.  Any medical conditions you have. What are the risks? Generally, this is a safe procedure. However, problems may occur, including:  Infection.  Bleeding.  Worsening symptoms.  Damage to the area around your knee.  Allergic reaction to any of the medicines.  Skin reactions from repeated injections.  What happens before the procedure?  Ask your health care provider about changing or stopping your regular medicines. This is especially important if you are taking diabetes medicines or blood thinners.  Plan to have someone take you home after the procedure. What happens during the procedure?  You will sit or lie down in a position for your knee to be treated.  The skin over your kneecap will be cleaned with a germ-killing solution (antiseptic).  You will be given a medicine that numbs the area (local anesthetic). You may feel some stinging.  After your knee becomes numb, you will have a second injection. This is the medicine. This needle is carefully placed between your kneecap and your knee. The medicine is  injected into the joint space.  At the end of the procedure, the needle will be removed.  A bandage (dressing) may be placed over the injection site. The procedure may vary among health care providers and hospitals. What happens after the procedure?  You may have to move your knee through its full range of motion. This helps to get all of the medicine into your joint space.  Your blood pressure, heart rate, breathing rate, and blood oxygen level will be monitored often until the medicines you were given have worn off.  You will be watched to make sure that you do not have a reaction to the injected medicine. This information is not intended to replace advice given to you by your health care provider. Make sure you discuss any questions you have with your health care provider. Document Released: 07/30/2006 Document Revised: 10/08/2015 Document Reviewed: 03/18/2014 Elsevier Interactive Patient Education  2018 [employer].

## 2017-02-06 ENCOUNTER — Telehealth: Payer: Self-pay | Admitting: *Deleted

## 2017-02-06 NOTE — Telephone Encounter (Signed)
Spoke with Crystal, will call in a new prescription for Lyrica. Patient notified.

## 2017-02-06 NOTE — Telephone Encounter (Signed)
Attempted to call patient, message left. 

## 2017-03-07 ENCOUNTER — Telehealth: Payer: Self-pay | Admitting: Nurse Practitioner

## 2017-03-07 NOTE — Telephone Encounter (Signed)
Ok I checked last discharge not and this is what it told us "Return in about 3 months [place](around 04/10/2017) for MedMgmt, w/ Dr. Laban EmperorNaveira, Procedure(NS).  There wasn't anything about bring back in a month. I did see in notes about procedure but no order was entered. I will send to Alona BeneJoyce about procedure as it has to be prior [place]. Do we need to bring patient in this month for med refill appt with Crystal?

## 2017-03-07 NOTE — Telephone Encounter (Signed)
Crystal,            At last refill encounter you did not give patient any refills on her Lyrica. Please advise on what to tell patient. Thank you- Victorino DikeJennifer

## 2017-03-07 NOTE — Telephone Encounter (Signed)
Patient was only given 1 script for Lyrica, needs to get refill, next appt is Nov 18, patient wanted to see if script could be called in until next appt.

## 2017-03-07 NOTE — Telephone Encounter (Signed)
This is a new Rx she should have actually followed up in one month to evaluate the effectiveness and to possibly increase the dose.  However we can call this in with a refill She was to have an knee injection. It looks like the scheduled her with Dr Dorris CarnesN. For the follow up.. This is not correct.  She will follow up with me and have injection with them. Can you please have them adjust this thanks.

## 2017-03-07 NOTE — Telephone Encounter (Signed)
Lyrica refill called into [facility]. Patient has follow up on 04-09-2017 for med refill. Jennifer Rivers to call patient if her knee injection is approved.

## 2017-03-14 DIAGNOSIS — Z0000 Encounter for general adult medical examination without abnormal findings: Secondary | ICD-10-CM | POA: Insufficient documentation

## 2017-04-09 ENCOUNTER — Ambulatory Visit: Payer: Medicare HMO | Attending: Pain Medicine | Admitting: Nurse Practitioner

## 2017-04-09 ENCOUNTER — Encounter: Payer: Self-pay | Admitting: Nurse Practitioner

## 2017-04-09 VITALS — BP 173/81 | HR 87 | Temp 98.6°F | Resp 16 | Ht 62.0 in | Wt 150.0 lb

## 2017-04-09 DIAGNOSIS — Z880 Allergy status to penicillin: Secondary | ICD-10-CM | POA: Insufficient documentation

## 2017-04-09 DIAGNOSIS — Z90710 Acquired absence of both cervix and uterus: Secondary | ICD-10-CM | POA: Diagnosis not present

## 2017-04-09 DIAGNOSIS — M545 Low back pain, unspecified: Secondary | ICD-10-CM

## 2017-04-09 DIAGNOSIS — S83209A Unspecified tear of unspecified meniscus, current injury, unspecified knee, initial encounter: Secondary | ICD-10-CM | POA: Diagnosis not present

## 2017-04-09 DIAGNOSIS — M160 Bilateral primary osteoarthritis of hip: Secondary | ICD-10-CM | POA: Insufficient documentation

## 2017-04-09 DIAGNOSIS — Z833 Family history of diabetes mellitus: Secondary | ICD-10-CM | POA: Diagnosis not present

## 2017-04-09 DIAGNOSIS — X58XXXA Exposure to other specified factors, initial encounter: Secondary | ICD-10-CM | POA: Insufficient documentation

## 2017-04-09 DIAGNOSIS — F329 Major depressive disorder, single episode, unspecified: Secondary | ICD-10-CM | POA: Diagnosis not present

## 2017-04-09 DIAGNOSIS — Z8249 Family history of ischemic heart disease and other diseases of the circulatory system: Secondary | ICD-10-CM | POA: Insufficient documentation

## 2017-04-09 DIAGNOSIS — K219 Gastro-esophageal reflux disease without esophagitis: Secondary | ICD-10-CM | POA: Diagnosis not present

## 2017-04-09 DIAGNOSIS — M79604 Pain in right leg: Secondary | ICD-10-CM

## 2017-04-09 DIAGNOSIS — M5116 Intervertebral disc disorders with radiculopathy, lumbar region: Secondary | ICD-10-CM | POA: Diagnosis not present

## 2017-04-09 DIAGNOSIS — Z79899 Other long term (current) drug therapy: Secondary | ICD-10-CM | POA: Diagnosis not present

## 2017-04-09 DIAGNOSIS — R7982 Elevated C-reactive protein (CRP): Secondary | ICD-10-CM | POA: Diagnosis not present

## 2017-04-09 DIAGNOSIS — I10 Essential (primary) hypertension: Secondary | ICD-10-CM | POA: Insufficient documentation

## 2017-04-09 DIAGNOSIS — G8929 Other chronic pain: Secondary | ICD-10-CM

## 2017-04-09 DIAGNOSIS — G894 Chronic pain syndrome: Secondary | ICD-10-CM | POA: Diagnosis not present

## 2017-04-09 DIAGNOSIS — F17210 Nicotine dependence, cigarettes, uncomplicated: Secondary | ICD-10-CM | POA: Insufficient documentation

## 2017-04-09 DIAGNOSIS — M79605 Pain in left leg: Secondary | ICD-10-CM

## 2017-04-09 DIAGNOSIS — J449 Chronic obstructive pulmonary disease, unspecified: Secondary | ICD-10-CM | POA: Insufficient documentation

## 2017-04-09 DIAGNOSIS — Z888 Allergy status to other drugs, medicaments and biological substances status: Secondary | ICD-10-CM | POA: Insufficient documentation

## 2017-04-09 DIAGNOSIS — Z885 Allergy status to narcotic agent status: Secondary | ICD-10-CM | POA: Insufficient documentation

## 2017-04-09 DIAGNOSIS — Z79891 Long term (current) use of opiate analgesic: Secondary | ICD-10-CM | POA: Diagnosis not present

## 2017-04-09 DIAGNOSIS — M170 Bilateral primary osteoarthritis of knee: Secondary | ICD-10-CM | POA: Insufficient documentation

## 2017-04-09 DIAGNOSIS — Z9889 Other specified postprocedural states: Secondary | ICD-10-CM | POA: Insufficient documentation

## 2017-04-09 DIAGNOSIS — M792 Neuralgia and neuritis, unspecified: Secondary | ICD-10-CM | POA: Diagnosis not present

## 2017-04-09 DIAGNOSIS — M48061 Spinal stenosis, lumbar region without neurogenic claudication: Secondary | ICD-10-CM | POA: Diagnosis not present

## 2017-04-09 MED ORDER — OXYCODONE HCL 5 MG PO TABS: 5.0000 mg | ORAL_TABLET | Freq: Four times a day (QID) | ORAL | 0 refills | Status: DC | PRN

## 2017-04-09 MED ORDER — PREGABALIN 50 MG PO CAPS: 50.0000 mg | ORAL_CAPSULE | Freq: Three times a day (TID) | ORAL | 2 refills | Status: DC

## 2017-04-09 NOTE — Patient Instructions (Addendum)
____________________________________________________________________________________________  Medication Rules  Applies to: All patients receiving prescriptions (written or electronic).  Pharmacy of record: Pharmacy where electronic prescriptions will be sent. If written prescriptions are taken to a different pharmacy, please inform the nursing staff. The pharmacy listed in the electronic medical record should be the one where you would like electronic prescriptions to be sent.  Prescription refills: Only during scheduled appointments. Applies to both, written and electronic prescriptions.  NOTE: The following applies primarily to controlled substances (Opioid* Pain Medications).   Patient's responsibilities: 1. Pain Pills: Bring all pain pills to every appointment (except for procedure appointments). 2. Pill Bottles: Bring pills in original pharmacy bottle. Always bring newest bottle. Bring bottle, even if empty. 3. Medication refills: You are responsible for knowing and keeping track of what medications you need refilled. The day before your appointment, write a list of all prescriptions that need to be refilled. Bring that list to your appointment and give it to the admitting nurse. Prescriptions will be written only during appointments. If you forget a medication, it will not be "Called in", "Faxed", or "electronically sent". You will need to get another appointment to get these prescribed. 4. Prescription Accuracy: You are responsible for carefully inspecting your prescriptions before leaving our office. Have the discharge nurse carefully go over each prescription with you, before taking them home. Make sure that your name is accurately spelled, that your address is correct. Check the name and dose of your medication to make sure it is accurate. Check the number of pills, and the written instructions to make sure they are clear and accurate. Make sure that you are given enough medication to  last until your next medication refill appointment. 5. Taking Medication: Take medication as prescribed. Never take more pills than instructed. Never take medication more frequently than prescribed. Taking less pills or less frequently is permitted and encouraged, when it comes to controlled substances (written prescriptions).  6. Inform other Doctors: Always inform, all of your healthcare providers, of all the medications you take. 7. Pain Medication from other Providers: You are not allowed to accept any additional pain medication from any other Doctor or Healthcare provider. There are two exceptions to this rule. (see below) In the event that you require additional pain medication, you are responsible for notifying us, as stated below. 8. Medication Agreement: You are responsible for carefully reading and following our Medication Agreement. This must be signed before receiving any prescriptions from our practice. Safely store a copy of your signed Agreement. Violations to the Agreement will result in no further prescriptions. (Additional copies of our Medication Agreement are available upon request.) 9. Laws, Rules, & Regulations: All patients are expected to follow all Federal and State Laws, Statutes, Rules, & Regulations. Ignorance of the Laws does not constitute a valid excuse. The use of any illegal substances is prohibited. 10. Adopted CDC guidelines & recommendations: Target dosing levels will be at or below 60 MME/day. Use of benzodiazepines** is not recommended.  Exceptions: There are only two exceptions to the rule of not receiving pain medications from other Healthcare Providers. 1. Exception #1 (Emergencies): In the event of an emergency (i.e.: accident requiring emergency care), you are allowed to receive additional pain medication. However, you are responsible for: As soon as you are able, call our office [phone], at any time of the day or night, and leave a message stating your  name, the date and nature of the emergency, and the name and dose of the medication   prescribed. In the event that your call is answered by a member of our staff, make sure to document and save the date, time, and the name of the person that took your information.  2. Exception #2 (Planned Surgery): In the event that you are scheduled by another doctor or dentist to have any type of surgery or procedure, you are allowed (for a period no longer than 30 days), to receive additional pain medication, for the acute post-op pain. However, in this case, you are responsible for picking up a copy of our "Post-op Pain Management for Surgeons" handout, and giving it to your surgeon or dentist. This document is available at our office, and does not require an appointment to obtain it. Simply go to our office during business hours (Monday-Thursday from 8:00 AM to 4:00 PM) (Friday 8:00 AM to 12:00 Noon) or if you have a scheduled appointment with us, prior to your surgery, and ask for it by name. In addition, you will need to provide us with your name, name of your surgeon, type of surgery, and date of procedure or surgery.  *Opioid medications include: morphine, codeine, oxycodone, oxymorphone, hydrocodone, hydromorphone, meperidine, tramadol, tapentadol, buprenorphine, fentanyl, methadone. **Benzodiazepine medications include: diazepam (Valium), alprazolam (Xanax), clonazepam (Klonopine), lorazepam (Ativan), clorazepate (Tranxene), chlordiazepoxide (Librium), estazolam (Prosom), oxazepam (Serax), temazepam (Restoril), triazolam (Halcion)  ____________________________________________________________________________________________  GENERAL RISKS AND COMPLICATIONS  What are the risk, side effects and possible complications? Generally speaking, most procedures are safe.  However, with any procedure there are risks, side effects, and the possibility of complications.  The risks and complications are dependent upon the  sites that are lesioned, or the type of nerve block to be performed.  The closer the procedure is to the spine, the more serious the risks are.  Great care is taken when placing the radio frequency needles, block needles or lesioning probes, but sometimes complications can occur. 1. Infection: Any time there is an injection through the skin, there is a risk of infection.  This is why sterile conditions are used for these blocks.  There are four possible types of infection. 1. Localized skin infection. 2. Central Nervous System Infection-This can be in the form of Meningitis, which can be deadly. 3. Epidural Infections-This can be in the form of an epidural abscess, which can cause pressure inside of the spine, causing compression of the spinal cord with subsequent paralysis. This would require an emergency surgery to decompress, and there are no guarantees that the patient would recover from the paralysis. 4. Discitis-This is an infection of the intervertebral discs.  It occurs in about 1% of discography procedures.  It is difficult to treat and it may lead to surgery.        2. Pain: the needles have to go through skin and soft tissues, will cause soreness.       3. Damage to internal structures:  The nerves to be lesioned may be near blood vessels or    other nerves which can be potentially damaged.       4. Bleeding: Bleeding is more common if the patient is taking blood thinners such as  aspirin, Coumadin, Ticiid, Plavix, etc., or if he/she have some genetic predisposition  such as hemophilia. Bleeding into the spinal canal can cause compression of the spinal  cord with subsequent paralysis.  This would require an emergency surgery to  decompress and there are no guarantees that the patient would recover from the  paralysis.         5. Pneumothorax:  Puncturing of a lung is a possibility, every time a needle is introduced in  the area of the chest or upper back.  Pneumothorax refers to free air around  the  collapsed lung(s), inside of the thoracic cavity (chest cavity).  Another two possible  complications related to a similar event would include: Hemothorax and Chylothorax.   These are variations of the Pneumothorax, where instead of air around the collapsed  lung(s), you may have blood or chyle, respectively.       6. Spinal headaches: They may occur with any procedures in the area of the spine.       7. Persistent CSF (Cerebro-Spinal Fluid) leakage: This is a rare problem, but may occur  with prolonged intrathecal or epidural catheters either due to the formation of a fistulous  track or a dural tear.       8. Nerve damage: By working so close to the spinal cord, there is always a possibility of  nerve damage, which could be as serious as a permanent spinal cord injury with  paralysis.       9. Death:  Although rare, severe deadly allergic reactions known as "Anaphylactic  reaction" can occur to any of the medications used.      10. Worsening of the symptoms:  We can always make thing worse.  What are the chances of something like this happening? Chances of any of this occuring are extremely low.  By statistics, you have more of a chance of getting killed in a motor vehicle accident: while driving to the hospital than any of the above occurring .  Nevertheless, you should be aware that they are possibilities.  In general, it is similar to taking a shower.  Everybody knows that you can slip, hit your head and get killed.  Does that mean that you should not shower again?  Nevertheless always keep in mind that statistics do not mean anything if you happen to be on the wrong side of them.  Even if a procedure has a 1 (one) in a 1,000,000 (million) chance of going wrong, it you happen to be that one..Also, keep in mind that by statistics, you have more of a chance of having something go wrong when taking medications.  Who should not have this procedure? If you are on a blood thinning medication (e.g.  Coumadin, Plavix, see list of "Blood Thinners"), or if you have an active infection going on, you should not have the procedure.  If you are taking any blood thinners, please inform your physician.  How should I prepare for this procedure?  Do not eat or drink anything at least six hours prior to the procedure.  Bring a driver with you .  It cannot be a taxi.  Come accompanied by an adult that can drive you back, and that is strong enough to help you if your legs get weak or numb from the local anesthetic.  Take all of your medicines the morning of the procedure with just enough water to swallow them.  If you have diabetes, make sure that you are scheduled to have your procedure done first thing in the morning, whenever possible.  If you have diabetes, take only half of your insulin dose and notify our nurse that you have done so as soon as you arrive at the clinic.  If you are diabetic, but only take blood sugar pills (oral hypoglycemic), then do not take them on the morning of your   procedure.  You may take them after you have had the procedure.  Do not take aspirin or any aspirin-containing medications, at least eleven (11) days prior to the procedure.  They may prolong bleeding.  Wear loose fitting clothing that may be easy to take off and that you would not mind if it got stained with Betadine or blood.  Do not wear any jewelry or perfume  Remove any nail coloring.  It will interfere with some of our monitoring equipment.  NOTE: Remember that this is not meant to be interpreted as a complete list of all possible complications.  Unforeseen problems may occur.  BLOOD THINNERS The following drugs contain aspirin or other products, which can cause increased bleeding during surgery and should not be taken for 2 weeks prior to and 1 week after surgery.  If you should need take something for relief of minor pain, you may take acetaminophen which is found in Tylenol,m Datril, Anacin-3 and  Panadol. It is not blood thinner. The products listed below are.  Do not take any of the products listed below in addition to any listed on your instruction sheet.  A.P.C or A.P.C with Codeine Codeine Phosphate Capsules #3 Ibuprofen Ridaura  ABC compound Congesprin Imuran rimadil  Advil Cope Indocin Robaxisal  Alka-Seltzer Effervescent Pain Reliever and Antacid Coricidin or Coricidin-D  Indomethacin Rufen  Alka-Seltzer plus Cold Medicine Cosprin Ketoprofen S-A-C Tablets  Anacin Analgesic Tablets or Capsules Coumadin Korlgesic Salflex  Anacin Extra Strength Analgesic tablets or capsules CP-2 Tablets Lanoril Salicylate  Anaprox Cuprimine Capsules Levenox Salocol  Anexsia-D Dalteparin Magan Salsalate  Anodynos Darvon compound Magnesium Salicylate Sine-off  Ansaid Dasin Capsules Magsal Sodium Salicylate  Anturane Depen Capsules Marnal Soma  APF Arthritis pain formula Dewitt's Pills Measurin Stanback  Argesic Dia-Gesic Meclofenamic Sulfinpyrazone  Arthritis Bayer Timed Release Aspirin Diclofenac Meclomen Sulindac  Arthritis pain formula Anacin Dicumarol Medipren Supac  Analgesic (Safety coated) Arthralgen Diffunasal Mefanamic Suprofen  Arthritis Strength Bufferin Dihydrocodeine Mepro Compound Suprol  Arthropan liquid Dopirydamole Methcarbomol with Aspirin Synalgos  ASA tablets/Enseals Disalcid Micrainin Tagament  Ascriptin Doan's Midol Talwin  Ascriptin A/D Dolene Mobidin Tanderil  Ascriptin Extra Strength Dolobid Moblgesic Ticlid  Ascriptin with Codeine Doloprin or Doloprin with Codeine Momentum Tolectin  Asperbuf Duoprin Mono-gesic Trendar  Aspergum Duradyne Motrin or Motrin IB Triminicin  Aspirin plain, buffered or enteric coated Durasal Myochrisine Trigesic  Aspirin Suppositories Easprin Nalfon Trillsate  Aspirin with Codeine Ecotrin Regular or Extra Strength Naprosyn Uracel  Atromid-S Efficin Naproxen Ursinus  Auranofin Capsules Elmiron Neocylate Vanquish  Axotal Emagrin Norgesic  Verin  Azathioprine Empirin or Empirin with Codeine Normiflo Vitamin E  Azolid Emprazil Nuprin Voltaren  Bayer Aspirin plain, buffered or children's or timed BC Tablets or powders Encaprin Orgaran Warfarin Sodium  Buff-a-Comp Enoxaparin Orudis Zorpin  Buff-a-Comp with Codeine Equegesic Os-Cal-Gesic   Buffaprin Excedrin plain, buffered or Extra Strength Oxalid   Bufferin Arthritis Strength Feldene Oxphenbutazone   Bufferin plain or Extra Strength Feldene Capsules Oxycodone with Aspirin   Bufferin with Codeine Fenoprofen Fenoprofen Pabalate or Pabalate-SF   Buffets II Flogesic Panagesic   Buffinol plain or Extra Strength Florinal or Florinal with Codeine Panwarfarin   Buf-Tabs Flurbiprofen Penicillamine   Butalbital Compound Four-way cold tablets Penicillin   Butazolidin Fragmin Pepto-Bismol   Carbenicillin Geminisyn Percodan   Carna Arthritis Reliever Geopen Persantine   Carprofen Gold's salt Persistin   Chloramphenicol Goody's Phenylbutazone   Chloromycetin Haltrain Piroxlcam   Clmetidine heparin Plaquenil   Cllnoril Hyco-pap Ponstel   Clofibrate   Hydroxy chloroquine Propoxyphen         Before stopping any of these medications, be sure to consult the physician who ordered them.  Some, such as Coumadin (Warfarin) are ordered to prevent or treat serious conditions such as "deep thrombosis", "pumonary embolisms", and other heart problems.  The amount of time that you may need off of the medication may also vary with the medication and the reason for which you were taking it.  If you are taking any of these medications, please make sure you notify your pain physician before you undergo any procedures.         Epidural Steroid Injection Patient Information  Description: The epidural space surrounds the nerves as they exit the spinal cord.  In some patients, the nerves can be compressed and inflamed by a bulging disc or a tight spinal canal (spinal stenosis).  By injecting steroids into  the epidural space, we can bring irritated nerves into direct contact with a potentially helpful medication.  These steroids act directly on the irritated nerves and can reduce swelling and inflammation which often leads to decreased pain.  Epidural steroids may be injected anywhere along the spine and from the neck to the low back depending upon the location of your pain.   After numbing the skin with local anesthetic (like Novocaine), a small needle is passed into the epidural space slowly.  You may experience a sensation of pressure while this is being done.  The entire block usually last less than 10 minutes.  Conditions which may be treated by epidural steroids:   Low back and leg pain  Neck and arm pain  Spinal stenosis  Post-laminectomy syndrome  Herpes zoster (shingles) pain  Pain from compression fractures  Preparation for the injection:  3. Do not eat any solid food or dairy products within 8 hours of your appointment.  4. You may drink clear liquids up to 3 hours before appointment.  Clear liquids include water, black coffee, juice or soda.  No milk or cream please. 5. You may take your regular medication, including pain medications, with a sip of water before your appointment  Diabetics should hold regular insulin (if taken separately) and take 1/2 normal NPH dos the morning of the procedure.  Carry some sugar containing items with you to your appointment. 6. A driver must accompany you and be prepared to drive you home after your procedure.  7. Bring all your current medications with your. 8. An IV may be inserted and sedation may be given at the discretion of the physician.   9. A blood pressure cuff, EKG and other monitors will often be applied during the procedure.  Some patients may need to have extra oxygen administered for a short period. 10. You will be asked to provide medical information, including your allergies, prior to the procedure.  We must know immediately if  you are taking blood thinners (like Coumadin/Warfarin)  Or if you are allergic to IV iodine contrast (dye). We must know if you could possible be pregnant.  Possible side-effects:  Bleeding from needle site  Infection (rare, may require surgery)  Nerve injury (rare)  Numbness & tingling (temporary)  Difficulty urinating (rare, temporary)  Spinal headache ( a headache worse with upright posture)  Light -headedness (temporary)  Pain at injection site (several days)  Decreased blood pressure (temporary)  Weakness in arm/leg (temporary)  Pressure sensation in back/neck (temporary)  Call if you experience:  Fever/chills associated with headache or increased back/neck   pain.  Headache worsened by an upright position.  New onset weakness or numbness of an extremity below the injection site  Hives or difficulty breathing (go to the emergency room)  Inflammation or drainage at the infection site  Severe back/neck pain  Any new symptoms which are concerning to you  Please note:  Although the local anesthetic injected can often make your back or neck feel good for several hours after the injection, the pain will likely return.  It takes 3-7 days for steroids to work in the epidural space.  You may not notice any pain relief for at least that one week.  If effective, we will often do a series of three injections spaced 3-6 weeks apart to maximally decrease your pain.  After the initial series, we generally will wait several months before considering a repeat injection of the same type.  If you have any questions, please call [phone] [facility] 

## 2017-04-09 NOTE — Progress Notes (Signed)
Patient's Name: Jennifer Rivers  MRN: [id number]  Referring Provider: Kirk Ruths, MD  DOB: [birth year]  PCP: Kirk Ruths, MD  DOS: 04/09/2017  Note by: Vevelyn Francois NP  Service setting: Ambulatory outpatient  Specialty: Interventional Pain Management  Location: [facility] (AMB) Pain Management Facility    Patient type: Established    Primary Reason(s) for Visit: Encounter for prescription drug management. (Level of risk: moderate)  CC: Knee Pain (right is worse) and Back Pain (lower bilateral)  HPI  [name] is a 71 y.o. year old, female patient, who comes today for a medication management evaluation. She has Adult idiopathic generalized osteoporosis; Atypical migraine; COPD (chronic obstructive pulmonary disease) ([place]); Generalized OA; BP (high blood pressure); Current tear of meniscus; Major depression in remission ([place]); Chronic knee pain (Location of Primary Source of Pain) (Bilateral) (R>L); Long term current use of opiate analgesic; Long term prescription opiate use; Opiate use (20 MME/Day); Encounter for pain management planning; Encounter for therapeutic drug level monitoring; Chronic low back pain (Location of Secondary source of pain) (Bilateral) (L>R); Tricompartmental knee arthropathy (Bilateral); Chronic upper back pain (between shoulder blades) (Bilateral) (R>L); Chronic shoulder pain (Location of Tertiary source of pain) (Bilateral) (L>R); 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); Chronic lower extremity pain (Bilateral) (R>L); Chronic lower extremity radicular pain (Bilateral) (R>L); MRSA (methicillin resistant staph aureus) culture positive; Radicular pain of shoulder; Chronic cervical radicular pain (C4/C5 dermatomes) (L); Elevated sed rate; Elevated C-reactive protein (CRP); Chronic pain syndrome; Osteoarthritis of knees (Bilateral); Tear of meniscus of knee; Hip pain, chronic, unspecified laterality (B) (R>L); Primary osteoarthritis of hips, bilateral; Spondylosis of  lumbar spine; Lumbosacral facet joint syndrome ([place]); Spinal stenosis of lumbar region; and Chronic Neurogenic pain on their problem list. Her primarily concern today is the Knee Pain (right is worse) and Back Pain (lower bilateral)  Pain Assessment: Location: Right Knee(knee) Radiating: pain comes up from knee and affects the back causing pain in the lower lumbar region bilaterally Onset: More than a month ago Duration: Chronic pain Quality: Constant, Dull, Discomfort Severity: 6 /10 (self-reported pain score)  Note: Reported level is compatible with observation. Clinically the patient looks like a             Information on the proper use of the pain scale provided to the patient today. When using our objective Pain Scale, levels between 6 and 10/10 are said to belong in an emergency room, as it progressively worsens from a 6/10, described as severely limiting, requiring emergency care not usually available at an outpatient pain management facility. At a 6/10 level, communication becomes difficult and requires great effort. Assistance to reach the emergency department may be required. Facial flushing and profuse sweating along with potentially dangerous increases in heart rate and blood pressure will be evident. Effect on ADL: slows her down. patient is out of Lyrica currently which was really helping.  Timing: Constant Modifying factors: lyrica, pain medications knocks the edge off, but is not helping like it was   [name] was last scheduled for an appointment on 03/07/2017 for medication management. During today's appointment we reviewed [name]'s chronic pain status, as well as her outpatient medication regimen. She admits that she is having increased pain. She is has been out of her Lyrica for a few days and she feels like this has affected her pain significantly. She states that she needs something done for her back pain. She admits that she has pain that radiates down her leg into her  feet.   The patient  reports that she does not use drugs. Her body mass index is 27.44 kg/m.  Further details on both, my assessment(s), as well as the proposed treatment plan, please see below.  Controlled Substance Pharmacotherapy Assessment REMS (Risk Evaluation and Mitigation Strategy)  Analgesic:Oxycodone IR 5 mg 1 tablet by mouth every 6 hours (20 mg/dayof oxycodone) MME/day:90m/day. PJanett Billow RN  04/09/2017  3:48 PM  Sign at close encounter Nursing Pain Medication Assessment:  Safety precautions to be maintained throughout the outpatient stay will include: orient to surroundings, keep bed in low position, maintain call bell within reach at all times, provide assistance with transfer out of bed and ambulation.  Medication Inspection Compliance: Pill count conducted under aseptic conditions, in front of the patient. Neither the pills nor the bottle was removed from the patient's sight at any time. Once count was completed pills were immediately returned to the patient in their original bottle.  Medication: Oxycodone IR Pill/Patch Count: 97 of 120 pills remain Pill/Patch Appearance: Markings consistent with prescribed medication Bottle Appearance: Standard pharmacy container. Clearly labeled. Filled Date: 167/ 15 / 2018 Last Medication intake:  Today   Pharmacokinetics: Liberation and absorption (onset of action): WNL Distribution (time to peak effect): WNL Metabolism and excretion (duration of action): WNL         Pharmacodynamics: Desired effects: Analgesia: Ms. RLuoreports >50% benefit. Functional ability: Patient reports that medication allows her to accomplish basic ADLs Clinically meaningful improvement in function (CMIF): Sustained CMIF goals met Perceived effectiveness: Described as relatively effective, allowing for increase in activities of daily living (ADL) Undesirable effects: Side-effects or Adverse reactions: None reported Monitoring: [place] PMP:  Online review of the past 13-montheriod conducted. Compliant with practice rules and regulations Last UDS on record: Summary  Date Value Ref Range Status  10/05/2016 FINAL  Final    Comment:    ==================================================================== TOXASSURE SELECT 13 (MW) ==================================================================== Test                             Result       Flag       Units Drug Present and Declared for Prescription Verification   Oxycodone                      435          EXPECTED   ng/mg creat   Oxymorphone                    165          EXPECTED   ng/mg creat   Noroxycodone                   520          EXPECTED   ng/mg creat    Sources of oxycodone include scheduled prescription medications.    Oxymorphone and noroxycodone are expected metabolites of    oxycodone. Oxymorphone is also available as a scheduled    prescription medication. ==================================================================== Test                      Result    Flag   Units      Ref Range   Creatinine              51  mg/dL      >=20 ==================================================================== Declared Medications:  The flagging and interpretation on this report are based on the  following declared medications.  Unexpected results may arise from  inaccuracies in the declared medications.  **Note: The testing scope of this panel includes these medications:  Oxycodone  **Note: The testing scope of this panel does not include following  reported medications:  Albuterol  Carvedilol  Cholecalciferol  Cyanocobalamin  Cyclobenzaprine  Omeprazole ==================================================================== For clinical consultation, please call [phone]. ====================================================================    UDS interpretation: Compliant          Medication Assessment Form: Reviewed. Patient indicates  being compliant with therapy Treatment compliance: Compliant Risk Assessment Profile: Aberrant behavior: See prior evaluations. None observed or detected today Comorbid factors increasing risk of overdose: See prior notes. No additional risks detected today Risk of substance use disorder (SUD): Low Opioid Risk Tool - 04/09/17 1543      Family History of Substance Abuse   Alcohol  Negative    Illegal Drugs  Negative    Rx Drugs  Negative      Personal History of Substance Abuse   Alcohol  Negative    Illegal Drugs  Negative    Rx Drugs  Negative      Psychological Disease   Psychological Disease  Negative    Depression  Negative      Total Score   Opioid Risk Tool Scoring  0    Opioid Risk Interpretation  Low Risk      ORT Scoring interpretation table:  Score <3 = Low Risk for SUD  Score between 4-7 = Moderate Risk for SUD  Score >8 = High Risk for Opioid Abuse   Risk Mitigation Strategies:  Patient Counseling: Covered Patient-Prescriber Agreement (PPA): Present and active  Notification to other healthcare providers: Done  Pharmacologic Plan: No change in therapy, at this time  Laboratory Chemistry  Inflammation Markers (CRP: Acute Phase) (ESR: Chronic Phase) Lab Results  Component Value Date   CRP 3.0 (H) 12/28/2015   ESRSEDRATE 55 (H) 12/28/2015                 Renal Function Markers Lab Results  Component Value Date   BUN 18 12/28/2015   CREATININE 1.15 (H) 12/28/2015   GFRAA 56 (L) 12/28/2015   GFRNONAA 48 (L) 12/28/2015                 Hepatic Function Markers Lab Results  Component Value Date   AST 24 12/28/2015   ALT 17 12/28/2015   ALBUMIN 4.2 12/28/2015   ALKPHOS 115 12/28/2015                 Electrolytes Lab Results  Component Value Date   NA 134 (L) 12/28/2015   K 4.3 12/28/2015   CL 99 (L) 12/28/2015   CALCIUM 9.8 12/28/2015   MG 1.9 12/28/2015                 Neuropathy Markers Lab Results  Component Value Date   VITAMINB12  176 (L) 12/28/2015                 Bone Pathology Markers Lab Results  Component Value Date   ALKPHOS 115 12/28/2015   25OHVITD1 24 (L) 12/28/2015   25OHVITD2 2.5 12/28/2015   25OHVITD3 21 12/28/2015   CALCIUM 9.8 12/28/2015                 Rheumatology Markers No results found  for: LABURIC, URICUR              Coagulation Parameters Lab Results  Component Value Date   PLT 444 (H) 09/06/2015                 Cardiovascular Markers Lab Results  Component Value Date   TROPONINI <0.03 03/11/2015   HGB 9.5 (L) 09/06/2015   HCT 29.5 (L) 09/06/2015                 CA Markers No results found for: CEA, CA125, LABCA2               Note: Lab results reviewed.  Recent Diagnostic Imaging Results  MR LUMBAR SPINE WO CONTRAST CLINICAL DATA:  Chronic low back pain.  Left hip pain arthralgia  EXAM: MRI LUMBAR SPINE WITHOUT CONTRAST  TECHNIQUE: Multiplanar, multisequence MR imaging of the lumbar spine was performed. No intravenous contrast was administered.  COMPARISON:  Lumbar MRI 11/24/2010  FINDINGS: Segmentation:  Normal  Alignment:  Mild retrolisthesis L3-4 L4-5.  Vertebrae:  Negative for fracture or mass.  Conus medullaris: Extends to the L1-2 level and appears normal.  Paraspinal and other soft tissues: Negative  Disc levels:  T12-L1:  Negative  L1-2:  Negative  L2-3: Mild disc degeneration and disc bulging. No significant stenosis.  L3-4: Disc degeneration with disc bulging. Small left paracentral disc protrusion unchanged. Progressive spurring on the left causing mild foraminal encroachment. Subarticular stenosis on the left. Mild spinal stenosis with mild progression.  L4-5: Moderate disc degeneration with disc space narrowing and diffuse endplate spurring. Spurring is more prominent right greater than the left and is similar to the prior study. Subarticular stenosis on the right is unchanged. No significant canal stenosis. Mild right foraminal  narrowing  L5-S1: Disc degeneration and spurring on the right causing mild right foraminal narrowing, similar to the prior study. Right-sided facet hypertrophy.  IMPRESSION: Small left paracentral disc protrusion L3-4 unchanged. Progressive spurring on the left. Subarticular stenosis on the left appears to have progressed and may be causing impingement of the left L4 nerve root. Mild progression of mild spinal stenosis  Progressive spurring on the right at L4-5 with subarticular stenosis on the right.  Mild right foraminal narrowing L5-S1 unchanged.  Electronically Signed   By: Franchot Gallo M.D.   On: 08/10/2016 11:39  Complexity Note: Imaging results reviewed. Results shared with Ms. Stann Mainland, using Layman's terms.                         Meds   Current Outpatient Medications:  .  albuterol (VENTOLIN HFA) 108 (90 BASE) MCG/ACT inhaler, Take 2 puffs by mouth every 6 (six) hours as needed., Disp: , Rfl:  .  amLODipine (NORVASC) 5 MG tablet, Take 5 mg daily by mouth., Disp: , Rfl:  .  carvedilol (COREG) 12.5 MG tablet, 12.5 mg 2 (two) times daily. , Disp: , Rfl:  .  Cholecalciferol (VITAMIN D-1000 MAX ST) 1000 units tablet, Take by mouth daily. , Disp: , Rfl:  .  omeprazole (PRILOSEC) 20 MG capsule, Take 20 mg by mouth daily. , Disp: , Rfl:  .  [START ON 07/04/2017] oxyCODONE (OXY IR/ROXICODONE) 5 MG immediate release tablet, Take 1 tablet (5 mg total) every 6 (six) hours as needed by mouth for severe pain., Disp: 120 tablet, Rfl: 0 .  [START ON 06/04/2017] oxyCODONE (OXY IR/ROXICODONE) 5 MG immediate release tablet, Take 1 tablet (5 mg total) every  6 (six) hours as needed by mouth for severe pain., Disp: 120 tablet, Rfl: 0 .  [START ON 05/05/2017] oxyCODONE (OXY IR/ROXICODONE) 5 MG immediate release tablet, Take 1 tablet (5 mg total) every 6 (six) hours as needed by mouth for severe pain., Disp: 120 tablet, Rfl: 0 .  pregabalin (LYRICA) 50 MG capsule, Take 1 capsule (50 mg total) 3  (three) times daily by mouth., Disp: 90 capsule, Rfl: 2  ROS  Constitutional: Denies any fever or chills Gastrointestinal: No reported hemesis, hematochezia, vomiting, or acute GI distress Musculoskeletal: Denies any acute onset joint swelling, redness, loss of ROM, or weakness Neurological: No reported episodes of acute onset apraxia, aphasia, dysarthria, agnosia, amnesia, paralysis, loss of coordination, or loss of consciousness  Allergies  Ms. Jakubowicz is allergic to bupropion; codeine; gabapentin; tramadol; penicillins; and sulfa antibiotics.  [place]  Drug: Ms. Roberson  reports that she does not use drugs. Alcohol:  reports that she does not drink alcohol. Tobacco:  reports that she has been smoking cigarettes.  She has been smoking about 0.50 packs per day. she has never used smokeless tobacco. Medical:  has a past medical history of Asthma, GERD (gastroesophageal reflux disease), Hypertension, MRSA (methicillin resistant staph aureus) culture positive (08/2015), Multilevel degenerative disc disease, Osteoarthritis, and Osteoporosis. Surgical: Ms. Upshur  has a past surgical history that includes Abdominal hysterectomy; Tonsillectomy; and Knee arthroscopy (Right, 09/15/2015). Family: family history includes Diabetes in her mother; Heart attack in her father.  Constitutional Exam  General appearance: Well nourished, well developed, and well hydrated. In no apparent acute distress Vitals:   04/09/17 1537  BP: (!) 173/81  Pulse: 87  Resp: 16  Temp: 98.6 F (37 C)  TempSrc: Oral  SpO2: 100%  Weight: 150 lb (68 kg)  Height: 5' 2" (1.575 m)   BMI Assessment: Estimated body mass index is 27.44 kg/m as calculated from the following:   Height as of this encounter: 5' 2" (1.575 m).   Weight as of this encounter: 150 lb (68 kg). Psych/Mental status: Alert, oriented x 3 (person, place, & time)       Eyes: PERLA Respiratory: No evidence of acute respiratory distress  Lumbar Spine Area Exam   Skin & Axial Inspection: No masses, redness, or swelling Alignment: Symmetrical Functional ROM: Unrestricted ROM      Stability: No instability detected Muscle Tone/Strength: Functionally intact. No obvious neuro-muscular anomalies detected. Sensory (Neurological): Unimpaired Palpation: Complains of area being tender to palpation       Provocative Tests: Lumbar Hyperextension and rotation test: Positive       Lumbar Lateral bending test: evaluation deferred today       Patrick's Maneuver: evaluation deferred today                    Gait & Posture Assessment  Ambulation: Unassisted Gait: Relatively normal for age and body habitus Posture: WNL   Lower Extremity Exam    Side: Right lower extremity  Side: Left lower extremity  Skin & Extremity Inspection: Skin color, temperature, and hair growth are WNL. No peripheral edema or cyanosis. No masses, redness, swelling, asymmetry, or associated skin lesions. No contractures.  Skin & Extremity Inspection: Skin color, temperature, and hair growth are WNL. No peripheral edema or cyanosis. No masses, redness, swelling, asymmetry, or associated skin lesions. No contractures.  Functional ROM: Unrestricted ROM          Functional ROM: Unrestricted ROM          Muscle  Tone/Strength: Functionally intact. No obvious neuro-muscular anomalies detected.  Muscle Tone/Strength: Functionally intact. No obvious neuro-muscular anomalies detected.  Sensory (Neurological): Unimpaired  Sensory (Neurological): Unimpaired  Palpation: No palpable anomalies  Palpation: No palpable anomalies   Assessment  Primary Diagnosis & Pertinent Problem List: The primary encounter diagnosis was Chronic low back pain (Location of Secondary source of pain) (Bilateral) (L>R). Diagnoses of Chronic lower extremity pain (Bilateral) (R>L), Chronic Neurogenic pain, and Chronic pain syndrome were also pertinent to this visit.  Status Diagnosis  Controlled Controlled Controlled 1.  Chronic low back pain (Location of Secondary source of pain) (Bilateral) (L>R)   2. Chronic lower extremity pain (Bilateral) (R>L)   3. Chronic Neurogenic pain   4. Chronic pain syndrome     Problems updated and reviewed during this visit: No problems updated. Plan of Care  Pharmacotherapy (Medications Ordered): Meds ordered this encounter  Medications  . oxyCODONE (OXY IR/ROXICODONE) 5 MG immediate release tablet    Sig: Take 1 tablet (5 mg total) every 6 (six) hours as needed by mouth for severe pain.    Dispense:  120 tablet    Refill:  0    Do not place this medication, or any other prescription from our practice, on "Automatic Refill". Patient may have prescription filled one day early if pharmacy is closed on scheduled refill date. Do not fill until: 07/04/2017 To last until: 08/03/2017    Order Specific Question:   Supervising Provider    Answer:   Molli Barrows [[id number]]  . oxyCODONE (OXY IR/ROXICODONE) 5 MG immediate release tablet    Sig: Take 1 tablet (5 mg total) every 6 (six) hours as needed by mouth for severe pain.    Dispense:  120 tablet    Refill:  0    Do not place this medication, or any other prescription from our practice, on "Automatic Refill". Patient may have prescription filled one day early if pharmacy is closed on scheduled refill date. Do not fill until: 06/04/2017 To last until: 07/04/2017    Order Specific Question:   Supervising Provider    Answer:   Molli Barrows [[id number]]  . oxyCODONE (OXY IR/ROXICODONE) 5 MG immediate release tablet    Sig: Take 1 tablet (5 mg total) every 6 (six) hours as needed by mouth for severe pain.    Dispense:  120 tablet    Refill:  0    Do not place this medication, or any other prescription from our practice, on "Automatic Refill". Patient may have prescription filled one day early if pharmacy is closed on scheduled refill date. Do not fill until: 05/05/2017 To last until:06/04/2017    Order Specific Question:   Supervising  Provider    Answer:   Molli Barrows [[id number]]  . pregabalin (LYRICA) 50 MG capsule    Sig: Take 1 capsule (50 mg total) 3 (three) times daily by mouth.    Dispense:  90 capsule    Refill:  2    Do not place this medication, or any other prescription from our practice, on "Automatic Refill". Patient may have prescription filled one day early if pharmacy is closed on scheduled refill date.    Order Specific Question:   Supervising Provider    Answer:   Milinda Pointer [[id number]]  This SmartLink is deprecated. Use AVSMEDLIST instead to display the medication list for a patient. Medications administered today: Ginette Bradway had no medications administered during this visit. Lab-work, procedure(s), and/or referral(s): Orders Placed This Encounter  Procedures  . Lumbar Epidural Injection   Imaging and/or referral(s): None  Interventional therapies: Planned, scheduled, and/or pending:  Right/bilateral intra-articular knee injection with local anesthetic and steroid   Considering:  Left cervical epidural steroid injection #2 Diagnostic bilateral intra-articular knee injection Possible series of 5 bilateral Hyalgan intra-articular knee injections Diagnostic bilateral genicular nerve blocks  Possible bilateral genicular nerve radiofrequency ablation Diagnostic bilateral lumbar facet block  Possible bilateral lumbar facet radiofrequency ablation  Diagnostic bilateral intra-articular shoulder joint injection  Diagnostic bilateral suprascapular nerve block  Possible bilateral suprascapular nerve radiofrequency ablation  Diagnostic right-sided L5-S1 transforaminal epidural steroid injection Diagnostic left sided S1 transforaminal epidural steroid injection Diagnostic right-sided L4-5 lumbar epidural steroid injection    Palliative PRN treatment(s):  Palliative bilateral intra-articular knee injection with local anesthetic and steroid. Left cervical epidural steroid injection #2     Provider-requested follow-up: Return in about 3 months (around 07/10/2017) for MedMgmt, w/ Dr. Dossie Arbour, Procedure(w/Sedation), ([facility]).  Future Appointments  Date Time Provider [place]  07/10/2017  1:45 PM Vevelyn Francois, NP [facility] None   Primary Care Physician: Kirk Ruths, MD Location: [facility] Note by: Vevelyn Francois NP Date: 04/09/2017; Time: 8:45 AM  Pain Score Disclaimer: We use the NRS-11 scale. This is a self-reported, subjective measurement of pain severity with only modest accuracy. It is used primarily to identify changes within a particular patient. It must be understood that outpatient pain scales are significantly less accurate that those used for research, where they can be applied under ideal controlled circumstances with minimal exposure to variables. In reality, the score is likely to be a combination of pain intensity and pain affect, where pain affect describes the degree of emotional arousal or changes in action readiness caused by the sensory experience of pain. Factors such as social and work situation, setting, emotional state, anxiety levels, expectation, and prior pain experience may influence pain perception and show large inter-individual differences that may also be affected by time variables.  Patient instructions provided during this appointment: Patient Instructions    ____________________________________________________________________________________________  Medication Rules  Applies to: All patients receiving prescriptions (written or electronic).  Pharmacy of record: Pharmacy where electronic prescriptions will be sent. If written prescriptions are taken to a different pharmacy, please inform the nursing staff. The pharmacy listed in the electronic medical record should be the one where you would like electronic prescriptions to be sent.  Prescription refills: Only during scheduled appointments.  Applies to both, written and electronic prescriptions.  NOTE: The following applies primarily to controlled substances (Opioid* Pain Medications).   Patient's responsibilities: 1. Pain Pills: Bring all pain pills to every appointment (except for procedure appointments). 2. Pill Bottles: Bring pills in original pharmacy bottle. Always bring newest bottle. Bring bottle, even if empty. 3. Medication refills: You are responsible for knowing and keeping track of what medications you need refilled. The day before your appointment, write a list of all prescriptions that need to be refilled. Bring that list to your appointment and give it to the admitting nurse. Prescriptions will be written only during appointments. If you forget a medication, it will not be "Called in", "Faxed", or "electronically sent". You will need to get another appointment to get these prescribed. 4. Prescription Accuracy: You are responsible for carefully inspecting your prescriptions before leaving our office. Have the discharge nurse carefully go over each prescription with you, before taking them home. Make sure that your name is accurately spelled, that your address is  correct. Check the name and dose of your medication to make sure it is accurate. Check the number of pills, and the written instructions to make sure they are clear and accurate. Make sure that you are given enough medication to last until your next medication refill appointment. 5. Taking Medication: Take medication as prescribed. Never take more pills than instructed. Never take medication more frequently than prescribed. Taking less pills or less frequently is permitted and encouraged, when it comes to controlled substances (written prescriptions).  6. Inform other Doctors: Always inform, all of your healthcare providers, of all the medications you take. 7. Pain Medication from other Providers: You are not allowed to accept any additional pain medication from any  other Doctor or Healthcare provider. There are two exceptions to this rule. (see below) In the event that you require additional pain medication, you are responsible for notifying us, as stated below. 8. Medication Agreement: You are responsible for carefully reading and following our Medication Agreement. This must be signed before receiving any prescriptions from our practice. Safely store a copy of your signed Agreement. Violations to the Agreement will result in no further prescriptions. (Additional copies of our Medication Agreement are available upon request.) 9. Laws, Rules, & Regulations: All patients are expected to follow all Federal and [employer], [employer], Rules, [employer] [facility]. Ignorance of the Laws does not constitute a valid excuse. The use of any illegal substances is prohibited. 10. Adopted CDC guidelines & recommendations: Target dosing levels will be at or below 60 MME/day. Use of benzodiazepines** is not recommended.  Exceptions: There are only two exceptions to the rule of not receiving pain medications from other Healthcare Providers. 1. Exception #1 (Emergencies): In the event of an emergency (i.e.: accident requiring emergency care), you are allowed to receive additional pain medication. However, you are responsible for: As soon as you are able, call our office [phone], at any time of the day or night, and leave a message stating your name, the date and nature of the emergency, and the name and dose of the medication prescribed. In the event that your call is answered by a member of our staff, make sure to document and save the date, time, and the name of the person that took your information.  2. Exception #2 (Planned Surgery): In the event that you are scheduled by another doctor or dentist to have any type of surgery or procedure, you are allowed (for a period no longer than 30 days), to receive additional pain medication, for the acute post-op pain. However, in this case,  you are responsible for picking up a copy of our "Post-op Pain Management for Surgeons" handout, and giving it to your surgeon or dentist. This document is available at our office, and does not require an appointment to obtain it. Simply go to our office during business hours (Monday-Thursday from 8:00 AM to 4:00 PM) (Friday 8:00 AM to 12:00 Noon) or if you have a scheduled appointment with [place], prior to your surgery, and ask for it by name. In addition, you will need to provide [place] with your name, name of your surgeon, type of surgery, and date of procedure or surgery.  *Opioid medications include: morphine, codeine, oxycodone, oxymorphone, hydrocodone, hydromorphone, meperidine, tramadol, tapentadol, buprenorphine, fentanyl, methadone. **Benzodiazepine medications include: diazepam (Valium), alprazolam (Xanax), clonazepam (Klonopine), lorazepam (Ativan), clorazepate (Tranxene), chlordiazepoxide (Librium), estazolam (Prosom), oxazepam (Serax), temazepam (Restoril), triazolam (Halcion)  ____________________________________________________________________________________________  GENERAL RISKS AND COMPLICATIONS  What are the risk, side effects and  possible complications? Generally speaking, most procedures are safe.  However, with any procedure there are risks, side effects, and the possibility of complications.  The risks and complications are dependent upon the sites that are lesioned, or the type of nerve block to be performed.  The closer the procedure is to the spine, the more serious the risks are.  Great care is taken when placing the radio frequency needles, block needles or lesioning probes, but sometimes complications can occur. 1. Infection: Any time there is an injection through the skin, there is a risk of infection.  This is why sterile conditions are used for these blocks.  There are four possible types of infection. 1. Localized skin infection. 2. Central Nervous System Infection-This can  be in the form of Meningitis, which can be deadly. 3. Epidural Infections-This can be in the form of an epidural abscess, which can cause pressure inside of the spine, causing compression of the spinal cord with subsequent paralysis. This would require an emergency surgery to decompress, and there are no guarantees that the patient would recover from the paralysis. 4. Discitis-This is an infection of the intervertebral discs.  It occurs in about 1% of discography procedures.  It is difficult to treat and it may lead to surgery.        2. Pain: the needles have to go through skin and soft tissues, will cause soreness.       3. Damage to internal structures:  The nerves to be lesioned may be near blood vessels or    other nerves which can be potentially damaged.       4. Bleeding: Bleeding is more common if the patient is taking blood thinners such as  aspirin, Coumadin, Ticiid, Plavix, etc., or if he/she have some genetic predisposition  such as hemophilia. Bleeding into the spinal canal can cause compression of the spinal  cord with subsequent paralysis.  This would require an emergency surgery to  decompress and there are no guarantees that the patient would recover from the  paralysis.       5. Pneumothorax:  Puncturing of a lung is a possibility, every time a needle is introduced in  the area of the chest or upper back.  Pneumothorax refers to free air around the  collapsed lung(s), inside of the thoracic cavity (chest cavity).  Another two possible  complications related to a similar event would include: Hemothorax and Chylothorax.   These are variations of the Pneumothorax, where instead of air around the collapsed  lung(s), you may have blood or chyle, respectively.       6. Spinal headaches: They may occur with any procedures in the area of the spine.       7. Persistent CSF (Cerebro-Spinal Fluid) leakage: This is a rare problem, but may occur  with prolonged intrathecal or epidural catheters either  due to the formation of a fistulous  track or a dural tear.       8. Nerve damage: By working so close to the spinal cord, there is always a possibility of  nerve damage, which could be as serious as a permanent spinal cord injury with  paralysis.       9. Death:  Although rare, severe deadly allergic reactions known as "Anaphylactic  reaction" can occur to any of the medications used.      10. Worsening of the symptoms:  We can always make thing worse.  What are the chances of something like this happening? Chances  of any of this occuring are extremely low.  By statistics, you have more of a chance of getting killed in a motor vehicle accident: while driving to the hospital than any of the above occurring .  Nevertheless, you should be aware that they are possibilities.  In general, it is similar to taking a shower.  Everybody knows that you can slip, hit your head and get killed.  Does that mean that you should not shower again?  Nevertheless always keep in mind that statistics do not mean anything if you happen to be on the wrong side of them.  Even if a procedure has a 1 (one) in a 1,000,000 (million) chance of going wrong, it you happen to be that one..Also, keep in mind that by statistics, you have more of a chance of having something go wrong when taking medications.  Who should not have this procedure? If you are on a blood thinning medication (e.g. Coumadin, Plavix, see list of "Blood Thinners"), or if you have an active infection going on, you should not have the procedure.  If you are taking any blood thinners, please inform your physician.  How should I prepare for this procedure?  Do not eat or drink anything at least six hours prior to the procedure.  Bring a driver with you .  It cannot be a taxi.  Come accompanied by an adult that can drive you back, and that is strong enough to help you if your legs get weak or numb from the local anesthetic.  Take all of your medicines the  morning of the procedure with just enough water to swallow them.  If you have diabetes, make sure that you are scheduled to have your procedure done first thing in the morning, whenever possible.  If you have diabetes, take only half of your insulin dose and notify our nurse that you have done so as soon as you arrive at the clinic.  If you are diabetic, but only take blood sugar pills (oral hypoglycemic), then do not take them on the morning of your procedure.  You may take them after you have had the procedure.  Do not take aspirin or any aspirin-containing medications, at least eleven (11) days prior to the procedure.  They may prolong bleeding.  Wear loose fitting clothing that may be easy to take off and that you would not mind if it got stained with Betadine or blood.  Do not wear any jewelry or perfume  Remove any nail coloring.  It will interfere with some of our monitoring equipment.  NOTE: Remember that this is not meant to be interpreted as a complete list of all possible complications.  Unforeseen problems may occur.  BLOOD THINNERS The following drugs contain aspirin or other products, which can cause increased bleeding during surgery and should not be taken for 2 weeks prior to and 1 week after surgery.  If you should need take something for relief of minor pain, you may take acetaminophen which is found in Tylenol,m Datril, Anacin-3 and Panadol. It is not blood thinner. The products listed below are.  Do not take any of the products listed below in addition to any listed on your instruction sheet.  A.P.C or A.P.C with Codeine Codeine Phosphate Capsules #3 Ibuprofen Ridaura  ABC compound Congesprin Imuran rimadil  Advil Cope Indocin Robaxisal  Alka-Seltzer Effervescent Pain Reliever and Antacid Coricidin or Coricidin-D  Indomethacin Rufen  Alka-Seltzer plus Cold Medicine Cosprin Ketoprofen S-A-C Tablets  Anacin Analgesic  Tablets or Capsules Coumadin Korlgesic Salflex  Anacin  Extra Strength Analgesic tablets or capsules CP-2 Tablets Lanoril Salicylate  Anaprox Cuprimine Capsules Levenox Salocol  Anexsia-D Dalteparin Magan Salsalate  Anodynos Darvon compound Magnesium Salicylate Sine-off  Ansaid Dasin Capsules Magsal Sodium Salicylate  Anturane Depen Capsules Marnal Soma  APF Arthritis pain formula Dewitt's Pills Measurin Stanback  Argesic Dia-Gesic Meclofenamic Sulfinpyrazone  Arthritis Bayer Timed Release Aspirin Diclofenac Meclomen Sulindac  Arthritis pain formula Anacin Dicumarol Medipren Supac  Analgesic (Safety coated) Arthralgen Diffunasal Mefanamic Suprofen  Arthritis Strength Bufferin Dihydrocodeine Mepro Compound Suprol  Arthropan liquid Dopirydamole Methcarbomol with Aspirin Synalgos  ASA tablets/Enseals Disalcid Micrainin Tagament  Ascriptin Doan's Midol Talwin  Ascriptin A/D Dolene Mobidin Tanderil  Ascriptin Extra Strength Dolobid Moblgesic Ticlid  Ascriptin with Codeine Doloprin or Doloprin with Codeine Momentum Tolectin  Asperbuf Duoprin Mono-gesic Trendar  Aspergum Duradyne Motrin or Motrin IB Triminicin  Aspirin plain, buffered or enteric coated Durasal Myochrisine Trigesic  Aspirin Suppositories Easprin Nalfon Trillsate  Aspirin with Codeine Ecotrin Regular or Extra Strength Naprosyn Uracel  Atromid-S Efficin Naproxen Ursinus  Auranofin Capsules Elmiron Neocylate Vanquish  Axotal Emagrin Norgesic Verin  Azathioprine Empirin or Empirin with Codeine Normiflo Vitamin E  Azolid Emprazil Nuprin Voltaren  Bayer Aspirin plain, buffered or children's or timed BC Tablets or powders Encaprin Orgaran Warfarin Sodium  Buff-a-Comp Enoxaparin Orudis Zorpin  Buff-a-Comp with Codeine Equegesic Os-Cal-Gesic   Buffaprin Excedrin plain, buffered or Extra Strength Oxalid   Bufferin Arthritis Strength Feldene Oxphenbutazone   Bufferin plain or Extra Strength Feldene Capsules Oxycodone with Aspirin   Bufferin with Codeine Fenoprofen Fenoprofen Pabalate or  Pabalate-SF   Buffets II Flogesic Panagesic   Buffinol plain or Extra Strength Florinal or Florinal with Codeine Panwarfarin   Buf-Tabs Flurbiprofen Penicillamine   Butalbital Compound Four-way cold tablets Penicillin   Butazolidin Fragmin Pepto-Bismol   Carbenicillin Geminisyn Percodan   Carna Arthritis Reliever Geopen Persantine   Carprofen Gold's salt Persistin   Chloramphenicol Goody's Phenylbutazone   Chloromycetin Haltrain Piroxlcam   Clmetidine heparin Plaquenil   Cllnoril Hyco-pap Ponstel   Clofibrate Hydroxy chloroquine Propoxyphen         Before stopping any of these medications, be sure to consult the physician who ordered them.  Some, such as Coumadin (Warfarin) are ordered to prevent or treat serious conditions such as "deep thrombosis", "pumonary embolisms", and other heart problems.  The amount of time that you may need off of the medication may also vary with the medication and the reason for which you were taking it.  If you are taking any of these medications, please make sure you notify your pain physician before you undergo any procedures.         Epidural Steroid Injection Patient Information  Description: The epidural space surrounds the nerves as they exit the spinal cord.  In some patients, the nerves can be compressed and inflamed by a bulging disc or a tight spinal canal (spinal stenosis).  By injecting steroids into the epidural space, we can bring irritated nerves into direct contact with a potentially helpful medication.  These steroids act directly on the irritated nerves and can reduce swelling and inflammation which often leads to decreased pain.  Epidural steroids may be injected anywhere along the spine and from the neck to the low back depending upon the location of your pain.   After numbing the skin with local anesthetic (like Novocaine), a small needle is passed into the epidural space slowly.  You may experience a sensation  of pressure while this is  being done.  The entire block usually last less than 10 minutes.  Conditions which may be treated by epidural steroids:   Low back and leg pain  Neck and arm pain  Spinal stenosis  Post-laminectomy syndrome  Herpes zoster (shingles) pain  Pain from compression fractures  Preparation for the injection:  3. Do not eat any solid food or dairy products within 8 hours of your appointment.  4. You may drink clear liquids up to 3 hours before appointment.  Clear liquids include water, black coffee, juice or soda.  No milk or cream please. 5. You may take your regular medication, including pain medications, with a sip of water before your appointment  Diabetics should hold regular insulin (if taken separately) and take 1/2 normal NPH dos the morning of the procedure.  Carry some sugar containing items with you to your appointment. 6. A driver must accompany you and be prepared to drive you home after your procedure.  7. Bring all your current medications with your. 8. An IV may be inserted and sedation may be given at the discretion of the physician.   9. A blood pressure cuff, EKG and other monitors will often be applied during the procedure.  Some patients may need to have extra oxygen administered for a short period. 10. You will be asked to provide medical information, including your allergies, prior to the procedure.  We must know immediately if you are taking blood thinners (like Coumadin/Warfarin)  Or if you are allergic to IV iodine contrast (dye). We must know if you could possible be pregnant.  Possible side-effects:  Bleeding from needle site  Infection (rare, may require surgery)  Nerve injury (rare)  Numbness & tingling (temporary)  Difficulty urinating (rare, temporary)  Spinal headache ( a headache worse with upright posture)  Light -headedness (temporary)  Pain at injection site (several days)  Decreased blood pressure (temporary)  Weakness in arm/leg  (temporary)  Pressure sensation in back/neck (temporary)  Call if you experience:  Fever/chills associated with headache or increased back/neck pain.  Headache worsened by an upright position.  New onset weakness or numbness of an extremity below the injection site  Hives or difficulty breathing (go to the emergency room)  Inflammation or drainage at the infection site  Severe back/neck pain  Any new symptoms which are concerning to you  Please note:  Although the local anesthetic injected can often make your back or neck feel good for several hours after the injection, the pain will likely return.  It takes 3-7 days for steroids to work in the epidural space.  You may not notice any pain relief for at least that one week.  If effective, we will often do a series of three injections spaced 3-6 weeks apart to maximally decrease your pain.  After the initial series, we generally will wait several months before considering a repeat injection of the same type.  If you have any questions, please call [phone] [facility]

## 2017-04-09 NOTE — Progress Notes (Signed)
Nursing Pain Medication Assessment:  Safety precautions to be maintained throughout the outpatient stay will include: orient to surroundings, keep bed in low position, maintain call bell within reach at all times, provide assistance with transfer out of bed and ambulation.  Medication Inspection Compliance: Pill count conducted under aseptic conditions, in front of the patient. Neither the pills nor the bottle was removed from the patient's sight at any time. Once count was completed pills were immediately returned to the patient in their original bottle.  Medication: Oxycodone IR Pill/Patch Count: 97 of 120 pills remain Pill/Patch Appearance: Markings consistent with prescribed medication Bottle Appearance: Standard pharmacy container. Clearly labeled. Filled Date: 5611 / 15 / 2018 Last Medication intake:  Today

## 2017-04-16 ENCOUNTER — Other Ambulatory Visit: Payer: Self-pay

## 2017-04-16 ENCOUNTER — Inpatient Hospital Stay
Admission: EM | Admit: 2017-04-16 | Discharge: 2017-04-18 | DRG: 300 | Disposition: A | Discharge status: Home / Self Care | Payer: Medicare HMO | Attending: Internal Medicine | Admitting: Internal Medicine

## 2017-04-16 ENCOUNTER — Encounter: Payer: Self-pay | Admitting: Emergency Medicine

## 2017-04-16 ENCOUNTER — Emergency Department: Payer: Medicare HMO

## 2017-04-16 DIAGNOSIS — E871 Hypo-osmolality and hyponatremia: Secondary | ICD-10-CM | POA: Diagnosis not present

## 2017-04-16 DIAGNOSIS — R0600 Dyspnea, unspecified: Secondary | ICD-10-CM

## 2017-04-16 DIAGNOSIS — N179 Acute kidney failure, unspecified: Secondary | ICD-10-CM | POA: Diagnosis present

## 2017-04-16 DIAGNOSIS — Z79899 Other long term (current) drug therapy: Secondary | ICD-10-CM

## 2017-04-16 DIAGNOSIS — I7789 Other specified disorders of arteries and arterioles: Secondary | ICD-10-CM | POA: Diagnosis present

## 2017-04-16 DIAGNOSIS — J4520 Mild intermittent asthma, uncomplicated: Secondary | ICD-10-CM | POA: Diagnosis present

## 2017-04-16 DIAGNOSIS — T461X5A Adverse effect of calcium-channel blockers, initial encounter: Secondary | ICD-10-CM | POA: Diagnosis present

## 2017-04-16 DIAGNOSIS — Z885 Allergy status to narcotic agent status: Secondary | ICD-10-CM | POA: Diagnosis not present

## 2017-04-16 DIAGNOSIS — Z8614 Personal history of Methicillin resistant Staphylococcus aureus infection: Secondary | ICD-10-CM

## 2017-04-16 DIAGNOSIS — K219 Gastro-esophageal reflux disease without esophagitis: Secondary | ICD-10-CM | POA: Diagnosis present

## 2017-04-16 DIAGNOSIS — Z882 Allergy status to sulfonamides status: Secondary | ICD-10-CM

## 2017-04-16 DIAGNOSIS — Z23 Encounter for immunization: Secondary | ICD-10-CM

## 2017-04-16 DIAGNOSIS — J440 Chronic obstructive pulmonary disease with acute lower respiratory infection: Secondary | ICD-10-CM | POA: Diagnosis present

## 2017-04-16 DIAGNOSIS — G894 Chronic pain syndrome: Secondary | ICD-10-CM | POA: Diagnosis present

## 2017-04-16 DIAGNOSIS — F17210 Nicotine dependence, cigarettes, uncomplicated: Secondary | ICD-10-CM | POA: Diagnosis present

## 2017-04-16 DIAGNOSIS — Z880 Allergy status to penicillin: Secondary | ICD-10-CM | POA: Diagnosis not present

## 2017-04-16 DIAGNOSIS — J209 Acute bronchitis, unspecified: Secondary | ICD-10-CM | POA: Diagnosis present

## 2017-04-16 DIAGNOSIS — R0602 Shortness of breath: Secondary | ICD-10-CM | POA: Diagnosis present

## 2017-04-16 DIAGNOSIS — N183 Chronic kidney disease, stage 3 (moderate): Secondary | ICD-10-CM | POA: Diagnosis present

## 2017-04-16 DIAGNOSIS — D638 Anemia in other chronic diseases classified elsewhere: Secondary | ICD-10-CM | POA: Diagnosis present

## 2017-04-16 DIAGNOSIS — E876 Hypokalemia: Secondary | ICD-10-CM | POA: Diagnosis present

## 2017-04-16 DIAGNOSIS — I129 Hypertensive chronic kidney disease with stage 1 through stage 4 chronic kidney disease, or unspecified chronic kidney disease: Secondary | ICD-10-CM | POA: Diagnosis present

## 2017-04-16 DIAGNOSIS — I959 Hypotension, unspecified: Secondary | ICD-10-CM | POA: Diagnosis present

## 2017-04-16 DIAGNOSIS — Z888 Allergy status to other drugs, medicaments and biological substances status: Secondary | ICD-10-CM

## 2017-04-16 LAB — URINALYSIS, ROUTINE W REFLEX MICROSCOPIC
Bilirubin Urine: NEGATIVE
Glucose, UA: NEGATIVE mg/dL
HGB URINE DIPSTICK: NEGATIVE
Ketones, ur: NEGATIVE mg/dL
Leukocytes, UA: NEGATIVE
Nitrite: NEGATIVE
PH: 5 (ref 5.0–8.0)
Protein, ur: NEGATIVE mg/dL
SPECIFIC GRAVITY, URINE: 1.004 — AB (ref 1.005–1.030)

## 2017-04-16 LAB — BRAIN NATRIURETIC PEPTIDE: B NATRIURETIC PEPTIDE 5: 188 pg/mL — AB (ref 0.0–100.0)

## 2017-04-16 LAB — BASIC METABOLIC PANEL
ANION GAP: 11 (ref 5–15)
BUN: 22 mg/dL — ABNORMAL HIGH (ref 6–20)
CALCIUM: 8.6 mg/dL — AB (ref 8.9–10.3)
CO2: 25 mmol/L (ref 22–32)
Chloride: 89 mmol/L — ABNORMAL LOW (ref 101–111)
Creatinine, Ser: 1.29 mg/dL — ABNORMAL HIGH (ref 0.44–1.00)
GFR, EST AFRICAN AMERICAN: 48 mL/min — AB (ref >60)
GFR, EST NON AFRICAN AMERICAN: 41 mL/min — AB (ref >60)
GLUCOSE: 116 mg/dL — AB (ref 65–99)
POTASSIUM: 3.5 mmol/L (ref 3.5–5.1)
Sodium: 125 mmol/L — ABNORMAL LOW (ref 135–145)

## 2017-04-16 LAB — CBC
HCT: 31.4 % — ABNORMAL LOW (ref 35.0–47.0)
Hemoglobin: 10.8 g/dL — ABNORMAL LOW (ref 12.0–16.0)
MCH: 26.4 pg (ref 26.0–34.0)
MCHC: 34.3 g/dL (ref 32.0–36.0)
MCV: 77.1 fL — AB (ref 80.0–100.0)
PLATELETS: 243 10*3/uL (ref 150–440)
RBC: 4.07 MIL/uL (ref 3.80–5.20)
RDW: 16.7 % — AB (ref 11.5–14.5)
WBC: 7.1 10*3/uL (ref 3.6–11.0)

## 2017-04-16 LAB — FOLATE: FOLATE: 8.7 ng/mL (ref >5.9)

## 2017-04-16 LAB — RETICULOCYTES
RBC.: 4.11 MIL/uL (ref 3.80–5.20)
RETIC COUNT ABSOLUTE: 49.3 10*3/uL (ref 19.0–183.0)
RETIC CT PCT: 1.2 % (ref 0.4–3.1)

## 2017-04-16 LAB — IRON AND TIBC
Iron: 35 ug/dL (ref 28–170)
Saturation Ratios: 10 % — ABNORMAL LOW (ref 10.4–31.8)
TIBC: 364 ug/dL (ref 250–450)
UIBC: 329 ug/dL

## 2017-04-16 LAB — VITAMIN B12: VITAMIN B 12: 246 pg/mL (ref 180–914)

## 2017-04-16 LAB — OSMOLALITY: OSMOLALITY: 260 mosm/kg — AB (ref 275–295)

## 2017-04-16 LAB — TROPONIN I
Troponin I: <0.03 ng/mL (ref <0.03)
Troponin I: <0.03 ng/mL (ref <0.03)

## 2017-04-16 LAB — FERRITIN: Ferritin: 35 ng/mL (ref 11–307)

## 2017-04-16 LAB — PROTEIN / CREATININE RATIO, URINE: CREATININE, URINE: 22 mg/dL

## 2017-04-16 LAB — OSMOLALITY, URINE: OSMOLALITY UR: 198 mosm/kg — AB (ref 300–900)

## 2017-04-16 LAB — SODIUM, URINE, RANDOM: SODIUM UR: 42 mmol/L

## 2017-04-16 LAB — TSH: TSH: 6.451 u[IU]/mL — AB (ref 0.350–4.500)

## 2017-04-16 LAB — MRSA PCR SCREENING: MRSA BY PCR: NEGATIVE

## 2017-04-16 MED ORDER — PREGABALIN 50 MG PO CAPS
50.0000 mg | ORAL_CAPSULE | Freq: Three times a day (TID) | ORAL | Status: DC
  Administered 2017-04-16 – 2017-04-18 (×7): 50 mg via ORAL
  Filled 2017-04-16 (×7): qty 1

## 2017-04-16 MED ORDER — FUROSEMIDE 10 MG/ML IJ SOLN
40.0000 mg | Freq: Once | INTRAMUSCULAR | Status: AC
  Administered 2017-04-16: 40 mg via INTRAVENOUS
  Filled 2017-04-16: qty 4

## 2017-04-16 MED ORDER — ONDANSETRON HCL 4 MG/2ML IJ SOLN: 4.0000 mg | Freq: Four times a day (QID) | INTRAMUSCULAR | Status: DC | PRN

## 2017-04-16 MED ORDER — SENNOSIDES-DOCUSATE SODIUM 8.6-50 MG PO TABS
1.0000 | ORAL_TABLET | Freq: Every evening | ORAL | Status: DC | PRN
Start: 2017-04-16 — End: 2017-04-18

## 2017-04-16 MED ORDER — ACETAMINOPHEN 650 MG RE SUPP: 650.0000 mg | Freq: Four times a day (QID) | RECTAL | Status: DC | PRN

## 2017-04-16 MED ORDER — ONDANSETRON HCL 4 MG PO TABS: 4.0000 mg | ORAL_TABLET | Freq: Four times a day (QID) | ORAL | Status: DC | PRN

## 2017-04-16 MED ORDER — BISACODYL 5 MG PO TBEC: 5.0000 mg | DELAYED_RELEASE_TABLET | Freq: Every day | ORAL | Status: DC | PRN

## 2017-04-16 MED ORDER — OXYCODONE HCL 5 MG PO TABS
5.0000 mg | ORAL_TABLET | Freq: Four times a day (QID) | ORAL | Status: DC | PRN
  Administered 2017-04-16 – 2017-04-18 (×8): 5 mg via ORAL
  Filled 2017-04-16 (×9): qty 1

## 2017-04-16 MED ORDER — ENOXAPARIN SODIUM 40 MG/0.4ML ~~LOC~~ SOLN
40.0000 mg | SUBCUTANEOUS | Status: DC
  Administered 2017-04-16 – 2017-04-18 (×3): 40 mg via SUBCUTANEOUS
  Filled 2017-04-16 (×3): qty 0.4

## 2017-04-16 MED ORDER — LOSARTAN POTASSIUM 50 MG PO TABS
100.0000 mg | ORAL_TABLET | Freq: Every day | ORAL | Status: DC
  Administered 2017-04-17 – 2017-04-18 (×2): 100 mg via ORAL
  Filled 2017-04-16 (×3): qty 2

## 2017-04-16 MED ORDER — KETOROLAC TROMETHAMINE 15 MG/ML IJ SOLN
15.0000 mg | Freq: Four times a day (QID) | INTRAMUSCULAR | Status: DC | PRN
  Filled 2017-04-16: qty 1

## 2017-04-16 MED ORDER — BENZONATATE 100 MG PO CAPS
100.0000 mg | ORAL_CAPSULE | Freq: Three times a day (TID) | ORAL | Status: DC
  Administered 2017-04-16 – 2017-04-18 (×7): 100 mg via ORAL
  Filled 2017-04-16 (×7): qty 1

## 2017-04-16 MED ORDER — PNEUMOCOCCAL VAC POLYVALENT 25 MCG/0.5ML IJ INJ
0.5000 mL | INJECTION | INTRAMUSCULAR | Status: AC
Start: 2017-04-17 — End: 2017-04-17
  Administered 2017-04-17: 10:00:00 0.5 mL via INTRAMUSCULAR
  Filled 2017-04-16: qty 0.5

## 2017-04-16 MED ORDER — ACETAMINOPHEN 325 MG PO TABS: 650.0000 mg | ORAL_TABLET | Freq: Four times a day (QID) | ORAL | Status: DC | PRN

## 2017-04-16 MED ORDER — NICOTINE 14 MG/24HR TD PT24
14.0000 mg | MEDICATED_PATCH | Freq: Every day | TRANSDERMAL | Status: DC
  Administered 2017-04-16: 14 mg via TRANSDERMAL
  Filled 2017-04-16 (×3): qty 1

## 2017-04-16 MED ORDER — PANTOPRAZOLE SODIUM 40 MG PO TBEC
40.0000 mg | DELAYED_RELEASE_TABLET | Freq: Every day | ORAL | Status: DC
  Administered 2017-04-16 – 2017-04-18 (×3): 40 mg via ORAL
  Filled 2017-04-16 (×3): qty 1

## 2017-04-16 MED ORDER — IPRATROPIUM-ALBUTEROL 0.5-2.5 (3) MG/3ML IN SOLN
3.0000 mL | Freq: Four times a day (QID) | RESPIRATORY_TRACT | Status: DC
  Administered 2017-04-16 – 2017-04-18 (×10): 3 mL via RESPIRATORY_TRACT
  Filled 2017-04-16 (×10): qty 3

## 2017-04-16 NOTE — ED Triage Notes (Signed)
Pt reports wheezing, short of breath intermittently with exertion and nonproductive cough for one week; pt says she's been seeing Dr Dareen PianoAnderson over the last month related to her HTN, saw him this past Wednesday; says she told him about the wheezing but "he was more interested in my blood pressure"; history of asthma; sats 93% in triage; voice hoarse;

## 2017-04-16 NOTE — ED Notes (Signed)
Pt states she has been coughing and wheezing x 1 week and she has been to the doctor but they did not help her. She states she has been coughing and has a sore throat

## 2017-04-16 NOTE — ED Notes (Signed)
Patient transported to X-ray 

## 2017-04-16 NOTE — Consult Note (Signed)
[facility]  CONSULT NOTE    Date: 04/16/2017                  Patient Name:  Jennifer GermanyDorothy Rolf  MRN: [id number]  DOB: 20-Mar-1946  Age / Sex: 71 y.o., female         PCP: Lauro RegulusAnderson, Marshall W, MD                 Service Requesting Consult: Dr. Juliene PinaMody                 Reason for Consult: Hyponatremia            History of Present Illness: Ms. Jennifer GermanyDorothy Kuzniar is a 71 y.o. white female with hypertension, GERD, osteoarthritis, osteoporosis, hysterectomy, chronic pain syndrome who was [facility] to [facility] on 04/16/2017 for Shortness of breath [R06.02] Hyponatremia [E87.1]   Nephrology consulted for hyponatremia. Patient had her hydrochlorothiazide removed.   Medications: Outpatient medications: Medications Prior to Admission  Medication Sig Dispense Refill Last Dose  . amLODipine (NORVASC) 10 MG tablet Take 1 tablet by mouth daily.   04/16/2017 at 0600  . carvedilol (COREG) 25 MG tablet Take 1 tablet by mouth 2 (two) times daily.   04/16/2017 at 0600  . cloNIDine (CATAPRES - DOSED IN MG/24 HR) 0.1 mg/24hr patch Place 1 patch onto the skin every 7 (seven) days.   04/13/2017  . losartan-hydrochlorothiazide (HYZAAR) 100-25 MG tablet Take 1 tablet by mouth daily.   04/16/2017 at 0600  . omeprazole (PRILOSEC) 20 MG capsule Take 20 mg by mouth daily.    04/16/2017 at 0600  . [START ON 05/05/2017] oxyCODONE (OXY IR/ROXICODONE) 5 MG immediate release tablet Take 1 tablet (5 mg total) every 6 (six) hours as needed by mouth for severe pain. 120 tablet 0 04/16/2017 at 0600  . pregabalin (LYRICA) 50 MG capsule Take 1 capsule (50 mg total) 3 (three) times daily by mouth. 90 capsule 2 04/16/2017 at 0600  . albuterol (VENTOLIN HFA) 108 (90 BASE) MCG/ACT inhaler Take 2 puffs by mouth every 6 (six) hours as needed.   prn at prn  . [START ON 07/04/2017] oxyCODONE (OXY IR/ROXICODONE) 5 MG immediate release tablet Take 1 tablet (5 mg total) every 6 (six) hours as needed by mouth for severe  pain. (Patient not taking: Reported on 04/16/2017) 120 tablet 0 Not Taking at Unknown time  . [START ON 06/04/2017] oxyCODONE (OXY IR/ROXICODONE) 5 MG immediate release tablet Take 1 tablet (5 mg total) every 6 (six) hours as needed by mouth for severe pain. (Patient not taking: Reported on 04/16/2017) 120 tablet 0 Not Taking at Unknown time    Current medications: Current Facility-Administered Medications  Medication Dose Route Frequency Provider Last Rate Last Dose  . acetaminophen (TYLENOL) tablet 650 mg  650 mg Oral Q6H PRN Adrian SaranMody, Sital, MD       Or  . acetaminophen (TYLENOL) suppository 650 mg  650 mg Rectal Q6H PRN Mody, Sital, MD      . benzonatate (TESSALON) capsule 100 mg  100 mg Oral TID Adrian SaranMody, Sital, MD   100 mg at 04/16/17 1003  . bisacodyl (DULCOLAX) EC tablet 5 mg  5 mg Oral Daily PRN Mody, Sital, MD      . enoxaparin (LOVENOX) injection 40 mg  40 mg Subcutaneous Q24H Mody, Sital, MD   40 mg at 04/16/17 1003  . ipratropium-albuterol (DUONEB) 0.5-2.5 (3) MG/3ML nebulizer solution 3 mL  3 mL Nebulization Q6H Adrian SaranMody, Sital, MD   3  mL at 04/16/17 1012  . ketorolac (TORADOL) 15 MG/ML injection 15 mg  15 mg Intravenous Q6H PRN Mody, Sital, MD      . losartan (COZAAR) tablet 100 mg  100 mg Oral Daily Mody, Sital, MD      . nicotine (NICODERM CQ - dosed in mg/24 hours) patch 14 mg  14 mg Transdermal Daily Adrian SaranMody, Sital, MD   14 mg at 04/16/17 1412  . ondansetron (ZOFRAN) tablet 4 mg  4 mg Oral Q6H PRN Adrian SaranMody, Sital, MD       Or  . ondansetron (ZOFRAN) injection 4 mg  4 mg Intravenous Q6H PRN Mody, Sital, MD      . oxyCODONE (Oxy IR/ROXICODONE) immediate release tablet 5 mg  5 mg Oral Q6H PRN Adrian SaranMody, Sital, MD   5 mg at 04/16/17 1108  . pantoprazole (PROTONIX) EC tablet 40 mg  40 mg Oral Daily Mody, Sital, MD   40 mg at 04/16/17 1030  . [START ON 04/17/2017] pneumococcal 23 valent vaccine (PNU-IMMUNE) injection 0.5 mL  0.5 mL Intramuscular Tomorrow-1000 Mody, Sital, MD      . pregabalin (LYRICA)  capsule 50 mg  50 mg Oral TID Adrian SaranMody, Sital, MD   50 mg at 04/16/17 1030  . senna-docusate (Senokot-S) tablet 1 tablet  1 tablet Oral QHS PRN Adrian SaranMody, Sital, MD          Allergies: Allergies  Allergen Reactions  . Bupropion Other (See Comments)  . Codeine     Feels like an elephant is sitting on my chest  . Gabapentin Other (See Comments)    Altered mental status  . Tramadol Nausea Only    "Makes me feel dizzy and like I am going to fall"  . Penicillins Rash    Has patient had a PCN reaction causing immediate rash, facial/tongue/throat swelling, SOB or lightheadedness with hypotension: Yes Has patient had a PCN reaction causing severe rash involving mucus membranes or skin necrosis: No Has patient had a PCN reaction that required hospitalization: No Has patient had a PCN reaction occurring within the last 10 years: No If all of the above answers are "NO", then may proceed with Cephalosporin use.  . Sulfa Antibiotics Rash      Past Medical History: Past Medical History:  Diagnosis Date  . Asthma   . GERD (gastroesophageal reflux disease)   . Hypertension   . MRSA (methicillin resistant staph aureus) culture positive 08/2015  . Multilevel degenerative disc disease   . Osteoarthritis   . Osteoporosis      Past Surgical History: Past Surgical History:  Procedure Laterality Date  . ABDOMINAL HYSTERECTOMY    . KNEE ARTHROSCOPY Right 09/15/2015   Procedure: ARTHROSCOPY KNEE partial medial and lateral menisectomy, chondroplasty;  Surgeon: Erin SonsHarold Kernodle, MD;  Location: [facility] ORS;  Service: Orthopedics;  Laterality: Right;  . TONSILLECTOMY       Family History: Family History  Problem Relation Age of Onset  . Diabetes Mother   . Heart attack Father      Social History: Social History   Socioeconomic History  . Marital status: Widowed    Spouse name: Not on file  . Number of children: 1  . Years of education: Not on file  . Highest education level: Not on file  Social  Needs  . Financial resource strain: Not on file  . Food insecurity - worry: Not on file  . Food insecurity - inability: Not on file  . Transportation needs - medical: Not on file  .  Transportation needs - non-medical: Not on file  Occupational History  . Occupation: retired Lawyer  Tobacco Use  . Smoking status: Current Every Day Smoker    Packs/day: 0.50    Types: Cigarettes  . Smokeless tobacco: Never Used  Substance and Sexual Activity  . Alcohol use: No    Alcohol/week: 0.0 oz  . Drug use: No  . Sexual activity: Not on file  Other Topics Concern  . Not on file  Social History Narrative   Lives with daughter, grandkids     Review of Systems: Review of Systems  Constitutional: Positive for diaphoresis and malaise/fatigue. Negative for chills, fever and weight loss.  HENT: Negative.  Negative for congestion, ear discharge, ear pain, hearing loss, nosebleeds, sinus pain, sore throat and tinnitus.   Eyes: Negative.  Negative for blurred vision, double vision, photophobia, pain, discharge and redness.  Respiratory: Positive for cough, shortness of breath and wheezing. Negative for hemoptysis, sputum production and stridor.   Cardiovascular: Positive for orthopnea, claudication, leg swelling and PND. Negative for chest pain and palpitations.  Gastrointestinal: Negative.  Negative for abdominal pain, blood in stool, constipation, diarrhea, heartburn, melena, nausea and vomiting.  Genitourinary: Negative.  Negative for dysuria, flank pain, frequency, hematuria and urgency.  Musculoskeletal: Negative.  Negative for back pain, falls, joint pain, myalgias and neck pain.  Skin: Negative.  Negative for itching and rash.  Neurological: Positive for weakness. Negative for dizziness, tingling, tremors, sensory change, speech change, focal weakness, seizures, loss of consciousness and headaches.  Endo/Heme/Allergies: Negative.  Negative for environmental allergies and polydipsia. Does not  bruise/bleed easily.  Psychiatric/Behavioral: Negative.  Negative for depression, hallucinations, memory loss, substance abuse and suicidal ideas. The patient is not nervous/anxious and does not have insomnia.     Vital Signs: Blood pressure (!) 97/50, pulse 75, temperature 97.6 F (36.4 C), temperature source Oral, resp. rate 20, height 5\' 2"  (1.575 m), weight 82.1 kg (180 lb 14.4 oz), SpO2 94 %.  Weight trends: Filed Weights   04/16/17 0526 04/16/17 0912 04/16/17 1411  Weight: 68 kg (150 lb) 83.8 kg (184 lb 11.9 oz) 82.1 kg (180 lb 14.4 oz)    Physical Exam: General: NAD, sitting up in bed  Head: Normocephalic, atraumatic. Moist oral mucosal membranes  Eyes: Anicteric, PERRL  Neck: Supple, trachea midline  Lungs:  +wheezing  Heart: Regular rate and rhythm  Abdomen:  Soft, nontender,   Extremities: 1+  peripheral edema.  Neurologic: Nonfocal, moving all four extremities  Skin: No lesions        Lab results: Basic Metabolic Panel: Recent Labs  Lab 04/16/17 0606  NA 125*  K 3.5  CL 89*  CO2 25  GLUCOSE 116*  BUN 22*  CREATININE 1.29*  CALCIUM 8.6*    Liver Function Tests: No results for input(s): AST, ALT, ALKPHOS, BILITOT, PROT, ALBUMIN in the last 168 hours. No results for input(s): LIPASE, AMYLASE in the last 168 hours. No results for input(s): AMMONIA in the last 168 hours.  CBC: Recent Labs  Lab 04/16/17 0539  WBC 7.1  HGB 10.8*  HCT 31.4*  MCV 77.1*  PLT 243    Cardiac Enzymes: Recent Labs  Lab 04/16/17 0606 04/16/17 1234  TROPONINI <0.03 <0.03    BNP: Invalid input(s): POCBNP  CBG: No results for input(s): GLUCAP in the last 168 hours.  Microbiology: Results for orders placed or performed during the hospital encounter of 04/16/17  MRSA PCR Screening     Status: None   Collection  Time: 04/16/17 10:30 AM  Result Value Ref Range Status   MRSA by PCR NEGATIVE NEGATIVE Final    Comment:        The GeneXpert MRSA Assay (FDA approved  for NASAL specimens only), is one component of a comprehensive MRSA colonization surveillance program. It is not intended to diagnose MRSA infection nor to guide or monitor treatment for MRSA infections.     Coagulation Studies: No results for input(s): LABPROT, INR in the last 72 hours.  Urinalysis: No results for input(s): COLORURINE, LABSPEC, PHURINE, GLUCOSEU, HGBUR, BILIRUBINUR, KETONESUR, PROTEINUR, UROBILINOGEN, NITRITE, LEUKOCYTESUR in the last 72 hours.  Invalid input(s): APPERANCEUR    Imaging: Dg Chest 2 View  Result Date: 04/16/2017 CLINICAL DATA:  Wheezing, shortness of breath, and nonproductive cough for 1 week. Hypertension. EXAM: CHEST  2 VIEW COMPARISON:  03/10/2015 FINDINGS: Mild cardiac enlargement. Central pulmonary vascular congestion. Linear scarring in the mid lungs and lung bases is unchanged since prior study. No focal consolidation. Calcified and ectatic aorta. Degenerative changes in the spine and shoulders. IMPRESSION: Cardiac enlargement with mild vascular congestion. No edema or consolidation. Persistent linear scarring in the mid lungs and lung bases. Electronically Signed   By: Burman Nieves M.D.   On: 04/16/2017 06:10      Assessment & Plan: Ms. Rejeana Fadness is a 71 y.o. white female with COPD/asthma, hypertension, GERD, osteoarthritis, osteoporosis, hysterectomy, chronic pain syndrome who was admitted to [facility] on 04/16/2017 for Shortness of breath [R06.02] Hyponatremia [E87.1]   1. Hyponatremia: hypervolemic on examination. Acute on chronic. Baseline sodium of 130 04/11/17.  - Agree with discontinued hydrochlorothiazide - Start fluid restriction - May need to start loop diuretic. Patient states she has been on furosemide in the past.   2. Hypertension: with concerns for underlying congestive heart failure. Hypotensive on admission.  - Home regimen of amlodipine, carvedilol, clonidine, losartan and hydrochlorothiazide.  - Holding  hydrochlorothiazide as above.   3. Chronic kidney disease stage III: baseline creatinine of 1.1, GFR of 49 on 11/21.  Creatinine at baseline.  Secondary to hypertension - Check urine studies.     LOS: 0 Timtohy Broski 11/26/20182:50 PM

## 2017-04-16 NOTE — Consult Note (Signed)
Cardiology Consultation:   Patient ID: Jennifer Rivers; [id number]; Jul 17, 1945   Admit date: 04/16/2017 Date of Consult: 04/16/2017  Primary Care Provider: Lauro Regulus, MD Primary Cardiologist: New to [facility] - consult by Kirke Corin   Patient Profile:   Jennifer Rivers is a 71 y.o. female with a hx of COPD/asthma, chronic pain syndrome, HTN, GERD, and hiatal hernia who is being seen today for the evaluation of SOB at the request of Dr. Juliene Pina.  History of Present Illness:   [name] has no previously known cardiac history. She previously underwent stress testing in 2006 that was negative. She has noted increased SOB with associated wheezing for the past 2-4 weeks. Cough not productive of sputum. Has been using her rescue inhaler more often with good results. Has stable 2-pillow orthopnea with known hiatal hernia. Chronic LE swelling that is stable. She does not apply salt to her foods or eat out at restaurants. She drinks less than 2 L for fluids daily. Never with chest pain.   Upon the patient's arrival to [facility] they were found to have BP 128/71, HR 83 bpm, temp 98.4, oxygen saturation 93-98% on room air, weight 184 pounds (154 pounds at PCP office on 102/08/2016). EKG not acute as below, CXR showed mild vascular congestion without edema. Labs showed BNP 188, troponin negative x 1, Na 125, K+ 3.5, glucose 116, BUN 22, SCr 1.29 (baseline ~1.1), WBC 7.1, HGB 10.8, PLT 243. She was given IV Lasix 40 mg x 1 upon admission with no documented UOP to date.   Past Medical History:  Diagnosis Date  . Asthma   . GERD (gastroesophageal reflux disease)   . Hypertension   . MRSA (methicillin resistant staph aureus) culture positive 08/2015  . Multilevel degenerative disc disease   . Osteoarthritis   . Osteoporosis     Past Surgical History:  Procedure Laterality Date  . ABDOMINAL HYSTERECTOMY    . KNEE ARTHROSCOPY Right 09/15/2015   Procedure: ARTHROSCOPY KNEE partial medial and lateral  menisectomy, chondroplasty;  Surgeon: Erin Sons, MD;  Location: [facility] ORS;  Service: Orthopedics;  Laterality: Right;  . TONSILLECTOMY       Home Meds: Prior to Admission medications   Medication Sig Start Date End Date Taking? Authorizing Provider  amLODipine (NORVASC) 10 MG tablet Take 1 tablet by mouth daily. 04/11/17  Yes [provider]  carvedilol (COREG) 25 MG tablet Take 1 tablet by mouth 2 (two) times daily. 04/10/17  Yes [provider]  cloNIDine (CATAPRES - DOSED IN MG/24 HR) 0.1 mg/24hr patch Place 1 patch onto the skin every 7 (seven) days. 04/11/17 04/11/18 Yes [provider]  losartan-hydrochlorothiazide (HYZAAR) 100-25 MG tablet Take 1 tablet by mouth daily.   Yes [provider]  omeprazole (PRILOSEC) 20 MG capsule Take 20 mg by mouth daily.    Yes [provider]  oxyCODONE (OXY IR/ROXICODONE) 5 MG immediate release tablet Take 1 tablet (5 mg total) every 6 (six) hours as needed by mouth for severe pain. 05/05/17 06/04/17 Yes Barbette Merino, NP  pregabalin (LYRICA) 50 MG capsule Take 1 capsule (50 mg total) 3 (three) times daily by mouth. 04/09/17 05/09/17 Yes King, Shana Chute, NP  albuterol (VENTOLIN HFA) 108 (90 BASE) MCG/ACT inhaler Take 2 puffs by mouth every 6 (six) hours as needed. 01/09/15   [provider]  oxyCODONE (OXY IR/ROXICODONE) 5 MG immediate release tablet Take 1 tablet (5 mg total) every 6 (six) hours as needed by mouth for  severe pain. Patient not taking: Reported on 04/16/2017 07/04/17 08/03/17  Barbette MerinoKing, Crystal M, NP  oxyCODONE (OXY IR/ROXICODONE) 5 MG immediate release tablet Take 1 tablet (5 mg total) every 6 (six) hours as needed by mouth for severe pain. Patient not taking: Reported on 04/16/2017 06/04/17 07/04/17  Barbette MerinoKing, Crystal M, NP    Inpatient Medications: Scheduled Meds: . benzonatate  100 mg Oral TID  . enoxaparin (LOVENOX) injection  40 mg Subcutaneous Q24H  . ipratropium-albuterol  3 mL  Nebulization Q6H  . losartan  100 mg Oral Daily  . pantoprazole  40 mg Oral Daily  . [START ON 04/17/2017] pneumococcal 23 valent vaccine  0.5 mL Intramuscular Tomorrow-1000  . pregabalin  50 mg Oral TID   Continuous Infusions:  PRN Meds: acetaminophen **OR** acetaminophen, bisacodyl, ketorolac, ondansetron **OR** ondansetron (ZOFRAN) IV, oxyCODONE, senna-docusate  Allergies:   Allergies  Allergen Reactions  . Bupropion Other (See Comments)  . Codeine     Feels like an elephant is sitting on my chest  . Gabapentin Other (See Comments)    Altered mental status  . Tramadol Nausea Only    "Makes me feel dizzy and like I am going to fall"  . Penicillins Rash    Has patient had a PCN reaction causing immediate rash, facial/tongue/throat swelling, SOB or lightheadedness with hypotension: Yes Has patient had a PCN reaction causing severe rash involving mucus membranes or skin necrosis: No Has patient had a PCN reaction that required hospitalization: No Has patient had a PCN reaction occurring within the last 10 years: No If all of the above answers are "NO", then may proceed with Cephalosporin use.  . Sulfa Antibiotics Rash    Social History:   Social History   Socioeconomic History  . Marital status: Widowed    Spouse name: Not on file  . Number of children: 1  . Years of education: Not on file  . Highest education level: Not on file  Social Needs  . Financial resource strain: Not on file  . Food insecurity - worry: Not on file  . Food insecurity - inability: Not on file  . Transportation needs - medical: Not on file  . Transportation needs - non-medical: Not on file  Occupational History  . Occupation: retired LawyerCNA  Tobacco Use  . Smoking status: Current Every Day Smoker    Packs/day: 0.50    Types: Cigarettes  . Smokeless tobacco: Never Used  Substance and Sexual Activity  . Alcohol use: No    Alcohol/week: 0.0 oz  . Drug use: No  . Sexual activity: Not on file    Other Topics Concern  . Not on file  Social History Narrative   Lives with daughter, grandkids     Family History:  Family History  Problem Relation Age of Onset  . Diabetes Mother   . Heart attack Father     ROS:  Review of Systems  Constitutional: Positive for malaise/fatigue. Negative for chills, diaphoresis, fever and weight loss.  HENT: Negative for congestion.   Eyes: Negative for discharge and redness.  Respiratory: Positive for cough, shortness of breath and wheezing. Negative for hemoptysis and sputum production.   Cardiovascular: Positive for leg swelling. Negative for chest pain, palpitations, orthopnea, claudication and PND.  Gastrointestinal: Negative for abdominal pain, blood in stool, heartburn, melena, nausea and vomiting.  Genitourinary: Negative for hematuria.  Musculoskeletal: Negative for falls and myalgias.  Skin: Negative for rash.  Neurological: Positive for weakness. Negative for dizziness, tingling, tremors, sensory  change, speech change, focal weakness and loss of consciousness.  Endo/Heme/Allergies: Does not bruise/bleed easily.  Psychiatric/Behavioral: Negative for substance abuse. The patient is not nervous/anxious.       Physical Exam/Data:   Vitals:   04/16/17 0745 04/16/17 0800 04/16/17 0830 04/16/17 0912  BP:  137/80 120/75 112/62  Pulse: 73   74  Resp:    20  Temp:    98.1 F (36.7 C)  TempSrc:    Oral  SpO2: 92%   98%  Weight:    184 lb 11.9 oz (83.8 kg)  Height:    5\' 2"  (1.575 m)   No intake or output data in the 24 hours ending 04/16/17 1036 Filed Weights   04/16/17 0526 04/16/17 0912  Weight: 150 lb (68 kg) 184 lb 11.9 oz (83.8 kg)   Body mass index is 33.79 kg/m.   Physical Exam: General: Well developed, well nourished, in no acute distress. Head: Normocephalic, atraumatic, sclera non-icteric, no xanthomas, nares without discharge.  Neck: Negative for carotid bruits. JVD not elevated. Lungs: Diffuse wheezing. No  crackles. Breathing is unlabored. Heart: RRR with S1 S2. No murmurs, rubs, or gallops appreciated. Abdomen: Soft, non-tender, non-distended with normoactive bowel sounds. No hepatomegaly. No rebound/guarding. No obvious abdominal masses. Msk:  Strength and tone appear normal for age. Extremities: No clubbing or cyanosis. Trace bilateral pre-tibial edema. Distal pedal pulses are 2+ and equal bilaterally. Neuro: Alert and oriented X 3. No facial asymmetry. No focal deficit. Moves all extremities spontaneously. Psych:  Responds to questions appropriately with a normal affect.   EKG:  The EKG was personally reviewed and demonstrates: NSR, 80 bpm, nonspecific st/t changes Telemetry:  Telemetry was personally reviewed and demonstrates: not on telemetry  Weights: Filed Weights   04/16/17 0526 04/16/17 0912  Weight: 150 lb (68 kg) 184 lb 11.9 oz (83.8 kg)    Relevant CV Studies: TTE pending  Laboratory Data:  Chemistry Recent Labs  Lab 04/16/17 0606  NA 125*  K 3.5  CL 89*  CO2 25  GLUCOSE 116*  BUN 22*  CREATININE 1.29*  CALCIUM 8.6*  GFRNONAA 41*  GFRAA 48*  ANIONGAP 11    No results for input(s): PROT, ALBUMIN, AST, ALT, ALKPHOS, BILITOT in the last 168 hours. Hematology Recent Labs  Lab 04/16/17 0539  WBC 7.1  RBC 4.07  HGB 10.8*  HCT 31.4*  MCV 77.1*  MCH 26.4  MCHC 34.3  RDW 16.7*  PLT 243   Cardiac Enzymes Recent Labs  Lab 04/16/17 0606  TROPONINI <0.03   No results for input(s): TROPIPOC in the last 168 hours.  BNP Recent Labs  Lab 04/16/17 0606  BNP 188.0*    DDimer No results for input(s): DDIMER in the last 168 hours.  Radiology/Studies:  Dg Chest 2 View  Result Date: 04/16/2017 IMPRESSION: Cardiac enlargement with mild vascular congestion. No edema or consolidation. Persistent linear scarring in the mid lungs and lung bases. Electronically Signed   By: Burman Nieves M.D.   On: 04/16/2017 06:10    Assessment and Plan:   1.  Dyspnea: -Less likely acute CHF exacerbation given only mild vascular congestion noted on CXR and BNP minimally elevated at only 188 -Renal function not consistent with volume overload given elevated BUN/SCr -Possible mild CHF, likely diastolic  -Would hold off any any further IV Lasix at this time given her hyponatremia and suggest re-evaluation by IM -Patient notes significant wheezing, consider asthma -Echo pending -Recommend standing scale weight to compare to weight  at PCP office on 10/24 -Bed scale unlikely accurate  -Cycle troponin x 3 to rule out  2. Hyponatremia: -Likely in the setting of HCTZ -Stop HCTZ -Fluid restrict  -Per IM  3. Lower extremity swelling: -Cannot rule out mild CHF exacerbation  -Likely 2/2 amlodipine, recommend holding this medication and following swelling -Less likely CHF  4. COPD: -Apepars stable -Per IM  5. AKI: -Per IM  6. Anemia: -Appears stable -Per IM   For questions or updates, please contact [facility] Please consult [email or web address] for contact info under Cardiology/STEMI.   Signed, Eula Listenyan Brand Siever, PA-C [facility] Pager: [phone] 04/16/2017, 10:36 AM

## 2017-04-16 NOTE — Plan of Care (Signed)
  Progressing Education: Knowledge of General Education information will improve 04/16/2017 1932 - Progressing by Kathreen CosierMalcolm, Baljit Liebert A, RN Health Behavior/Discharge Planning: Ability to manage health-related needs will improve 04/16/2017 1932 - Progressing by Kathreen CosierMalcolm, Christiona Siddique A, RN Clinical Measurements: Ability to maintain clinical measurements within normal limits will improve 04/16/2017 1932 - Progressing by Kathreen CosierMalcolm, Elder Davidian A, RN Will remain free from infection 04/16/2017 1932 - Progressing by Kathreen CosierMalcolm, Kazaria Gaertner A, RN Diagnostic test results will improve 04/16/2017 1932 - Progressing by Kathreen CosierMalcolm, Zenith Lamphier A, RN Respiratory complications will improve 04/16/2017 1932 - Progressing by Kathreen CosierMalcolm, Bertrice Leder A, RN Cardiovascular complication will be avoided 04/16/2017 1932 - Progressing by Kathreen CosierMalcolm, Kseniya Grunden A, RN Activity: Risk for activity intolerance will decrease 04/16/2017 1932 - Progressing by Kathreen CosierMalcolm, Arvine Clayburn A, RN Coping: Level of anxiety will decrease 04/16/2017 1932 - Progressing by Kathreen CosierMalcolm, Monta Police A, RN Pain Managment: General experience of comfort will improve 04/16/2017 1932 - Progressing by Kathreen CosierMalcolm, Tifani Dack A, RN

## 2017-04-16 NOTE — ED Notes (Signed)
ED Provider at bedside. 

## 2017-04-16 NOTE — ED Provider Notes (Signed)
[facility] Emergency Department Provider Note   ____________________________________________   First MD Initiated Contact with Patient 04/16/17 [phone]     (approximate)  I have reviewed the triage vital signs and the nursing notes.   HISTORY  Chief Complaint Wheezing; Shortness of Breath; and Cough    HPI Jennifer Rivers is a 71 y.o. female who comes into the hospital today with some wheezing all week.  She reports that it has been getting worse than the last 3-4 days.  She has had some coughing and headache.  She reports that she also wheezes every time she walks.  She reports that she gets very short of breath.  She denies any sick contacts.  She has inhalers at home but states that it did not help.  Patient has a history of asthma.  She denies any fevers or nausea or vomiting.  The patient has had some mild chest tightness especially when she wheezes and coughs.  She states that her cough is nonproductive.  She feels weak and tired.  The patient has been having problems with her blood pressure this week and was started on a new medication.  She reports though that today it is okay.  Patient decided to come into the hospital today for evaluation.  Patient is also had some intermittent swelling in her legs and ankles.   Past Medical History:  Diagnosis Date  . Asthma   . GERD (gastroesophageal reflux disease)   . Hypertension   . MRSA (methicillin resistant staph aureus) culture positive 08/2015  . Multilevel degenerative disc disease   . Osteoarthritis   . Osteoporosis     Patient Active Problem List   Diagnosis Date Noted  . Chronic Neurogenic pain 01/08/2017  . Spinal stenosis of lumbar region 10/05/2016  . Hip pain, chronic, unspecified laterality (B) (R>L) 07/17/2016  . Primary osteoarthritis of hips, bilateral 07/17/2016  . 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 Lumbosacral facet joint syndrome (HCC) 07/17/2016  . Chronic pain syndrome  04/06/2016  . Osteoarthritis of knees (Bilateral) 04/06/2016  . Elevated sed rate 02/03/2016  . Elevated C-reactive protein (CRP) 02/03/2016  . Chronic knee pain (Location of Primary Source of Pain) (Bilateral) (R>L) 12/28/2015  . Long term current use of opiate analgesic 12/28/2015  . Long term prescription opiate use 12/28/2015  . Opiate use (20 MME/Day) 12/28/2015  . Encounter for pain management planning 12/28/2015  . Encounter for therapeutic drug level monitoring 12/28/2015  . Chronic low back pain (Location of Secondary source of pain) (Bilateral) (L>R) 12/28/2015  . Tricompartmental knee arthropathy (Bilateral) 12/28/2015  . Chronic upper back pain (between shoulder blades) (Bilateral) (R>L) 12/28/2015  . Chronic shoulder pain (Location of Tertiary source of pain) (Bilateral) (L>R) 12/28/2015  . 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 Chronic lower extremity pain (Bilateral) (R>L) 12/28/2015  . Chronic lower extremity radicular pain (Bilateral) (R>L) 12/28/2015  . MRSA (methicillin resistant staph aureus) culture positive 12/28/2015  . Radicular pain of shoulder 12/28/2015  . Chronic cervical radicular pain (C4/C5 dermatomes) (L) 12/28/2015  . Current tear of meniscus 08/23/2015  . Tear of meniscus of knee 08/23/2015  . 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 Atypical migraine 07/14/2015  . COPD (chronic obstructive pulmonary disease) (HCC) 07/14/2015  . Generalized OA 07/14/2015  . BP (high blood pressure) 07/14/2015  . Major depression in remission (HCC) 07/14/2015    Past Surgical History:  Procedure Laterality Date  . ABDOMINAL HYSTERECTOMY    . KNEE  ARTHROSCOPY Right 09/15/2015   Procedure: ARTHROSCOPY KNEE partial medial and lateral menisectomy, chondroplasty;  Surgeon: Erin Sons, MD;  Location: [facility] ORS;  Service: Orthopedics;  Laterality: Right;  . TONSILLECTOMY      Prior to Admission medications   Medication Sig Start  Date End Date Taking? Authorizing Provider  amLODipine (NORVASC) 10 MG tablet Take 1 tablet by mouth daily. 04/11/17  Yes [provider]  carvedilol (COREG) 25 MG tablet Take 1 tablet by mouth 2 (two) times daily. 04/10/17  Yes [provider]  cloNIDine (CATAPRES - DOSED IN MG/24 HR) 0.1 mg/24hr patch Place 1 patch onto the skin every 7 (seven) days. 04/11/17 04/11/18 Yes [provider]  losartan-hydrochlorothiazide (HYZAAR) 100-25 MG tablet Take 1 tablet by mouth daily.   Yes [provider]  omeprazole (PRILOSEC) 20 MG capsule Take 20 mg by mouth daily.    Yes [provider]  oxyCODONE (OXY IR/ROXICODONE) 5 MG immediate release tablet Take 1 tablet (5 mg total) every 6 (six) hours as needed by mouth for severe pain. 05/05/17 06/04/17 Yes Barbette Merino, NP  pregabalin (LYRICA) 50 MG capsule Take 1 capsule (50 mg total) 3 (three) times daily by mouth. 04/09/17 05/09/17 Yes King, Shana Chute, NP  albuterol (VENTOLIN HFA) 108 (90 BASE) MCG/ACT inhaler Take 2 puffs by mouth every 6 (six) hours as needed. 01/09/15   [provider]  oxyCODONE (OXY IR/ROXICODONE) 5 MG immediate release tablet Take 1 tablet (5 mg total) every 6 (six) hours as needed by mouth for severe pain. Patient not taking: Reported on 04/16/2017 07/04/17 08/03/17  Barbette Merino, NP  oxyCODONE (OXY IR/ROXICODONE) 5 MG immediate release tablet Take 1 tablet (5 mg total) every 6 (six) hours as needed by mouth for severe pain. Patient not taking: Reported on 04/16/2017 06/04/17 07/04/17  Barbette Merino, NP    Allergies Bupropion; Codeine; Gabapentin; Tramadol; Penicillins; and Sulfa antibiotics  Family History  Problem Relation Age of Onset  . Diabetes Mother   . Heart attack Father     Social History Social History   Tobacco Use  . Smoking status: Current Every Day Smoker    Packs/day: 0.50    Types: Cigarettes  . Smokeless tobacco: Never Used  Substance Use Topics    . Alcohol use: No    Alcohol/week: 0.0 oz  . Drug use: No    Review of Systems  Constitutional: No fever/chills Eyes: No visual changes. ENT: No sore throat. Cardiovascular: Chest tightness Respiratory:  shortness of breath. Gastrointestinal: No abdominal pain.  No nausea, no vomiting.  No diarrhea.  No constipation. Genitourinary: Negative for dysuria. Musculoskeletal: Negative for back pain. Skin: Negative for rash. Neurological: Negative for headaches, focal weakness or numbness.   ____________________________________________   PHYSICAL EXAM:  VITAL SIGNS: ED Triage Vitals  Enc Vitals Group     BP 04/16/17 0525 128/71     Pulse Rate 04/16/17 0525 83     Resp 04/16/17 0525 20     Temp 04/16/17 0525 98.4 F (36.9 C)     Temp Source 04/16/17 0525 Oral     SpO2 04/16/17 0525 93 %     Weight 04/16/17 0526 150 lb (68 kg)     Height 04/16/17 0526 5\' 2"  (1.575 m)     Head Circumference --      Peak Flow --      Pain Score 04/16/17 0525 0     Pain Loc --      Pain  Edu? --      Excl. in GC? --     Constitutional: Alert and oriented. Well appearing and in mild distress. Eyes: Conjunctivae are normal. PERRL. EOMI. Head: Atraumatic. Nose: No congestion/rhinnorhea. Mouth/Throat: Mucous membranes are moist.  Oropharynx non-erythematous. Cardiovascular: Normal rate, regular rhythm. Grossly normal heart sounds.  Good peripheral circulation. Respiratory: Normal respiratory effort.  No retractions. Lungs CTAB. Gastrointestinal: Soft and nontender. No distention.  Positive bowel sounds Musculoskeletal: Bilateral lower extremity edema Neurologic:  Normal speech and language.  Skin:  Skin is warm, dry and intact.  Psychiatric: Mood and affect are normal.   ____________________________________________   LABS (all labs ordered are listed, but only abnormal results are displayed)  Labs Reviewed  CBC - Abnormal; Notable for the following components:      Result Value    Hemoglobin 10.8 (*)    HCT 31.4 (*)    MCV 77.1 (*)    RDW 16.7 (*)    All other components within normal limits  BASIC METABOLIC PANEL - Abnormal; Notable for the following components:   Sodium 125 (*)    Chloride 89 (*)    Glucose, Bld 116 (*)    BUN 22 (*)    Creatinine, Ser 1.29 (*)    Calcium 8.6 (*)    GFR calc non Af Amer 41 (*)    GFR calc Af Amer 48 (*)    All other components within normal limits  BRAIN NATRIURETIC PEPTIDE - Abnormal; Notable for the following components:   B Natriuretic Peptide 188.0 (*)    All other components within normal limits  TROPONIN I   ____________________________________________  EKG  ED ECG REPORT I, Rebecka Apley, the attending physician, personally viewed and interpreted this ECG.   Date: 04/16/2017  EKG Time: 523  Rate: 80  Rhythm: normal sinus rhythm  Axis: normal  Intervals:none  ST&T Change: ST depression in lead I, II, V4, V5 with some flipped T waves in leads III and aVF  ____________________________________________  RADIOLOGY  Dg Chest 2 View  Result Date: 04/16/2017 CLINICAL DATA:  Wheezing, shortness of breath, and nonproductive cough for 1 week. Hypertension. EXAM: CHEST  2 VIEW COMPARISON:  03/10/2015 FINDINGS: Mild cardiac enlargement. Central pulmonary vascular congestion. Linear scarring in the mid lungs and lung bases is unchanged since prior study. No focal consolidation. Calcified and ectatic aorta. Degenerative changes in the spine and shoulders. IMPRESSION: Cardiac enlargement with mild vascular congestion. No edema or consolidation. Persistent linear scarring in the mid lungs and lung bases. Electronically Signed   By: Burman Nieves M.D.   On: 04/16/2017 06:10    ____________________________________________   PROCEDURES  Procedure(s) performed: None  Procedures  Critical Care performed: No  ____________________________________________   INITIAL IMPRESSION / ASSESSMENT AND PLAN / ED  COURSE  As part of my medical decision making, I reviewed the following data within the electronic MEDICAL RECORD NUMBER Notes from prior ED visits and [place] Controlled Substance Database   This is a 71 year old female who comes into the hospital today with wheezing and shortness of breath.  The patient has had some dyspnea on exertion some leg swelling and some weakness.  My differential diagnosis includes asthma exacerbation, CHF exacerbation, pneumonia.  We did check some blood work and a chest x-ray.  The patient's chest x-ray does show some cardiac enlargement with some mild vascular congestion.  The patient's blood work revealed a sodium of 125.  The patient did not have any wheezing on her  exam.  I am concerned about possible CHF.  The patient does not have a history of congestive heart failure.    The patient will receive a dose of Lasix.  She will be admitted to the hospitalist service.  ____________________________________________   FINAL CLINICAL IMPRESSION(S) / ED DIAGNOSES  Final diagnoses:  Hyponatremia  Shortness of breath     ED Discharge Orders    None       Note:  This document was prepared using Dragon voice recognition software and may include unintentional dictation errors.    Rebecka ApleyWebster, Cloud Graham P, MD 04/16/17 [id number]

## 2017-04-16 NOTE — H&P (Signed)
[employer] [facility] [place] [facility]   PATIENT NAME: Jennifer Rivers    MR#:  [id number]  DATE OF BIRTH:  04-29-1946  DATE OF ADMISSION:  04/16/2017  PRIMARY CARE PHYSICIAN: Lauro Regulus, MD   REQUESTING/REFERRING PHYSICIAN: dr Zenda Alpers  CHIEF COMPLAINT:   SOB and cough HISTORY OF PRESENT ILLNESS:  Jennifer Rivers  is a 71 y.o. female with a known history of chronic pain syndrome and essential hypertension who presents with above complaint. Over the past 3 days patient has had increasing shortness of breath, wheezing and cough. She also reports PND, orthopnea and dyspnea exertion. She denies any chest pain. She has had poor by mouth intake over the past 3 days because of just generalized weakness. She has chronic lower extremity edema however reports that it is worse over the past few days. In the emergency room sodium level is 125. She will receive 1 dose of IV Lasix. Chest x-ray shows pulmonary edema.  PAST MEDICAL HISTORY:   Past Medical History:  Diagnosis Date  . Asthma   . GERD (gastroesophageal reflux disease)   . Hypertension   . MRSA (methicillin resistant staph aureus) culture positive 08/2015  . Multilevel degenerative disc disease   . Osteoarthritis   . Osteoporosis     PAST SURGICAL HISTORY:   Past Surgical History:  Procedure Laterality Date  . ABDOMINAL HYSTERECTOMY    . KNEE ARTHROSCOPY Right 09/15/2015   Procedure: ARTHROSCOPY KNEE partial medial and lateral menisectomy, chondroplasty;  Surgeon: Erin Sons, MD;  Location: [facility] ORS;  Service: Orthopedics;  Laterality: Right;  . TONSILLECTOMY      SOCIAL HISTORY:   Social History   Tobacco Use  . Smoking status: Current Every Day Smoker    Packs/day: 0.50    Types: Cigarettes  . Smokeless tobacco: Never Used  Substance Use Topics  . Alcohol use: No    Alcohol/week: 0.0 oz    FAMILY HISTORY:   Family History  Problem Relation Age of Onset  . Diabetes Mother   . Heart  attack Father     DRUG ALLERGIES:   Allergies  Allergen Reactions  . Bupropion Other (See Comments)  . Codeine     Feels like an elephant is sitting on my chest  . Gabapentin Other (See Comments)    Altered mental status  . Tramadol Nausea Only    "Makes me feel dizzy and like I am going to fall"  . Penicillins Rash    Has patient had a PCN reaction causing immediate rash, facial/tongue/throat swelling, SOB or lightheadedness with hypotension: Yes Has patient had a PCN reaction causing severe rash involving mucus membranes or skin necrosis: No Has patient had a PCN reaction that required hospitalization: No Has patient had a PCN reaction occurring within the last 10 years: No If all of the above answers are "NO", then may proceed with Cephalosporin use.  . Sulfa Antibiotics Rash    REVIEW OF SYSTEMS:   Review of Systems  Constitutional: Positive for malaise/fatigue. Negative for chills and fever.  HENT: Negative.  Negative for ear discharge, ear pain, hearing loss, nosebleeds and sore throat.   Eyes: Negative.  Negative for blurred vision and pain.  Respiratory: Positive for cough, shortness of breath and wheezing. Negative for hemoptysis and sputum production.   Cardiovascular: Positive for leg swelling and PND. Negative for chest pain and palpitations.  Gastrointestinal: Negative.  Negative for abdominal pain, blood in stool, diarrhea, nausea and vomiting.  Genitourinary: Negative.  Negative  for dysuria.  Musculoskeletal: Negative.  Negative for back pain.  Skin: Negative.   Neurological: Negative for dizziness, tremors, speech change, focal weakness, seizures and headaches.  Endo/Heme/Allergies: Negative.  Does not bruise/bleed easily.  Psychiatric/Behavioral: Negative.  Negative for depression, hallucinations and suicidal ideas.    MEDICATIONS AT HOME:   Prior to Admission medications   Medication Sig Start Date End Date Taking? Authorizing Provider  amLODipine  (NORVASC) 10 MG tablet Take 1 tablet by mouth daily. 04/11/17  Yes [provider]  carvedilol (COREG) 25 MG tablet Take 1 tablet by mouth 2 (two) times daily. 04/10/17  Yes [provider]  cloNIDine (CATAPRES - DOSED IN MG/24 HR) 0.1 mg/24hr patch Place 1 patch onto the skin every 7 (seven) days. 04/11/17 04/11/18 Yes [provider]  losartan-hydrochlorothiazide (HYZAAR) 100-25 MG tablet Take 1 tablet by mouth daily.   Yes [provider]  omeprazole (PRILOSEC) 20 MG capsule Take 20 mg by mouth daily.    Yes [provider]  oxyCODONE (OXY IR/ROXICODONE) 5 MG immediate release tablet Take 1 tablet (5 mg total) every 6 (six) hours as needed by mouth for severe pain. 05/05/17 06/04/17 Yes Barbette MerinoKing, Crystal M, NP  pregabalin (LYRICA) 50 MG capsule Take 1 capsule (50 mg total) 3 (three) times daily by mouth. 04/09/17 05/09/17 Yes King, Shana Chuterystal M, NP  albuterol (VENTOLIN HFA) 108 (90 BASE) MCG/ACT inhaler Take 2 puffs by mouth every 6 (six) hours as needed. 01/09/15   [provider]  oxyCODONE (OXY IR/ROXICODONE) 5 MG immediate release tablet Take 1 tablet (5 mg total) every 6 (six) hours as needed by mouth for severe pain. Patient not taking: Reported on 04/16/2017 07/04/17 08/03/17  Barbette MerinoKing, Crystal M, NP  oxyCODONE (OXY IR/ROXICODONE) 5 MG immediate release tablet Take 1 tablet (5 mg total) every 6 (six) hours as needed by mouth for severe pain. Patient not taking: Reported on 04/16/2017 06/04/17 07/04/17  Barbette MerinoKing, Crystal M, NP      VITAL SIGNS:  Blood pressure 137/80, pulse 73, temperature 98 F (36.7 C), temperature source Oral, resp. rate 20, height 5\' 2"  (1.575 m), weight 68 kg (150 lb), SpO2 92 %.  PHYSICAL EXAMINATION:   Physical Exam  Constitutional: She is oriented to person, place, and time and well-developed, well-nourished, and in no distress. No distress.  HENT:  Head: Normocephalic.  Eyes: No scleral icterus.  Neck: Normal range of  motion. Neck supple. JVD present. No tracheal deviation present.  Cardiovascular: Normal rate, regular rhythm and normal heart sounds. Exam reveals no gallop and no friction rub.  No murmur heard. Pulmonary/Chest: Effort normal. No respiratory distress. She has no wheezes. She has rales. She exhibits no tenderness.  Abdominal: Soft. Bowel sounds are normal. She exhibits no distension and no mass. There is no tenderness. There is no rebound and no guarding.  Musculoskeletal: Normal range of motion. She exhibits edema.  Neurological: She is alert and oriented to person, place, and time.  Skin: Skin is warm. No rash noted. No erythema.  Psychiatric: Affect and judgment normal.      LABORATORY PANEL:   CBC Recent Labs  Lab 04/16/17 0539  WBC 7.1  HGB 10.8*  HCT 31.4*  PLT 243   ------------------------------------------------------------------------------------------------------------------  Chemistries  Recent Labs  Lab 04/16/17 0606  NA 125*  K 3.5  CL 89*  CO2 25  GLUCOSE 116*  BUN 22*  CREATININE 1.29*  CALCIUM 8.6*   ------------------------------------------------------------------------------------------------------------------  Cardiac Enzymes Recent Labs  Lab 04/16/17 0606  TROPONINI <0.03   ------------------------------------------------------------------------------------------------------------------  RADIOLOGY:  Dg Chest 2 View  Result Date: 04/16/2017 CLINICAL DATA:  Wheezing, shortness of breath, and nonproductive cough for 1 week. Hypertension. EXAM: CHEST  2 VIEW COMPARISON:  03/10/2015 FINDINGS: Mild cardiac enlargement. Central pulmonary vascular congestion. Linear scarring in the mid lungs and lung bases is unchanged since prior study. No focal consolidation. Calcified and ectatic aorta. Degenerative changes in the spine and shoulders. IMPRESSION: Cardiac enlargement with mild vascular congestion. No edema or consolidation. Persistent linear  scarring in the mid lungs and lung bases. Electronically Signed   By: Burman NievesWilliam  Stevens M.D.   On: 04/16/2017 06:10    EKG:  Normal sinus rhythm no ST elevation or depression  IMPRESSION AND PLAN:   71 year old female with poorly controlled hypertension, mild intermittent asthma, ongoing tobacco dependence and chronic pain syndrome who presents with shortness of breath and wheezing.  1. Shortness of breath: I am suspecting that patient has new-onset congestive heart failure given her physical examination findings, symptoms and chest x-ray. One-time dose IV Lasix Echocardiogram to evaluate for underlying congestive heart failure Monitor intake and output with daily weight [facility] requested Start DuoNeb's  2. Hyponatremia: This can be due to CHF or from HCTZ HCTZ discontinued [facility] requested  3. Anemia: Check anemia panel  4. Mild intermittent asthma, chronic: Start DuoNeb's  There are no signs of exacerbation at this time  5. Poorly controlled essential hypertension: Continue Norvasc, clonidine patch, Coreg and losartan Follow blood pressure  6. Chronic pain syndrome: Continue outpatient regimen.  7. Tobacco dependence: Patient is encouraged to quit smoking. Counseling was provided for 4 minutes.   All the records are reviewed and case discussed with ED provider. Management plans discussed with the patient and she in agreement  CODE STATUS: full  TOTAL TIME TAKING CARE OF THIS PATIENT: 45 minutes.    Aarvi Stotts M.D on 04/16/2017 at 8:23 AM  Between 7am to 6pm - Pager - [phone]  After 6pm go to [email or web address] - Social research officer, [email or web address] [email or web address]  [facility]  Office  [phone]  CC: Primary care physician; Lauro RegulusAnderson, Marshall W, MD

## 2017-04-16 NOTE — ED Notes (Signed)
Pt given peanut butter 

## 2017-04-17 ENCOUNTER — Inpatient Hospital Stay
Admit: 2017-04-17 | Discharge: 2017-04-17 | Disposition: A | Discharge status: Home / Self Care | Payer: Medicare HMO | Attending: Internal Medicine | Admitting: Internal Medicine

## 2017-04-17 LAB — CBC
HEMATOCRIT: 29.9 % — AB (ref 35.0–47.0)
Hemoglobin: 10.2 g/dL — ABNORMAL LOW (ref 12.0–16.0)
MCH: 26.2 pg (ref 26.0–34.0)
MCHC: 34.2 g/dL (ref 32.0–36.0)
MCV: 76.6 fL — AB (ref 80.0–100.0)
PLATELETS: 248 10*3/uL (ref 150–440)
RBC: 3.9 MIL/uL (ref 3.80–5.20)
RDW: 16.5 % — AB (ref 11.5–14.5)
WBC: 6.4 10*3/uL (ref 3.6–11.0)

## 2017-04-17 LAB — BASIC METABOLIC PANEL
Anion gap: 11 (ref 5–15)
BUN: 16 mg/dL (ref 6–20)
CHLORIDE: 88 mmol/L — AB (ref 101–111)
CO2: 26 mmol/L (ref 22–32)
CREATININE: 1.07 mg/dL — AB (ref 0.44–1.00)
Calcium: 8.6 mg/dL — ABNORMAL LOW (ref 8.9–10.3)
GFR calc non Af Amer: 52 mL/min — ABNORMAL LOW (ref >60)
GFR, EST AFRICAN AMERICAN: 60 mL/min — AB (ref >60)
GLUCOSE: 105 mg/dL — AB (ref 65–99)
Potassium: 3.3 mmol/L — ABNORMAL LOW (ref 3.5–5.1)
Sodium: 125 mmol/L — ABNORMAL LOW (ref 135–145)

## 2017-04-17 LAB — T4, FREE: Free T4: 1.21 ng/dL — ABNORMAL HIGH (ref 0.61–1.12)

## 2017-04-17 LAB — TROPONIN I: Troponin I: <0.03 ng/mL (ref <0.03)

## 2017-04-17 LAB — MAGNESIUM: Magnesium: 1.6 mg/dL — ABNORMAL LOW (ref 1.7–2.4)

## 2017-04-17 MED ORDER — ZOLPIDEM TARTRATE 5 MG PO TABS
5.0000 mg | ORAL_TABLET | Freq: Every evening | ORAL | Status: DC | PRN
  Administered 2017-04-17: 23:00:00 5 mg via ORAL
  Filled 2017-04-17: qty 1

## 2017-04-17 MED ORDER — POTASSIUM CHLORIDE CRYS ER 20 MEQ PO TBCR
40.0000 meq | EXTENDED_RELEASE_TABLET | Freq: Once | ORAL | Status: AC
  Administered 2017-04-17: 40 meq via ORAL
  Filled 2017-04-17: qty 2

## 2017-04-17 MED ORDER — PHENOL 1.4 % MT LIQD
1.0000 | OROMUCOSAL | Status: DC | PRN
  Filled 2017-04-17: qty 177

## 2017-04-17 NOTE — Progress Notes (Signed)
[facility] [place] at [facility]   PATIENT NAME: Jennifer GermanyDorothy Rivers    MR#:  [id number]  DATE OF BIRTH:  [birth year]  SUBJECTIVE:   Patient reports that she feels much better. Shortness of breath and wheezing of improved. She feels like she has lost 2 pounds. Lower extremity edema has improved.  REVIEW OF SYSTEMS:    Review of Systems  Constitutional: Negative for fever, chills weight loss HENT: Negative for ear pain, nosebleeds, congestion, facial swelling, rhinorrhea, neck pain, neck stiffness and ear discharge.   Respiratory: Negative for cough, shortness of breath, wheezing  Cardiovascular: Negative for chest pain, palpitations and leg swelling.  Gastrointestinal: Negative for heartburn, abdominal pain, vomiting, diarrhea or consitpation Genitourinary: Negative for dysuria, urgency, frequency, hematuria Musculoskeletal: Negative for back pain or joint pain Neurological: Negative for dizziness, seizures, syncope, focal weakness,  numbness and headaches.  Hematological: Does not bruise/bleed easily.  Psychiatric/Behavioral: Negative for hallucinations, confusion, dysphoric mood    Tolerating Diet: yes      DRUG ALLERGIES:   Allergies  Allergen Reactions  . Bupropion Other (See Comments)  . Codeine     Feels like an elephant is sitting on my chest  . Gabapentin Other (See Comments)    Altered mental status  . Tramadol Nausea Only    "Makes me feel dizzy and like I am going to fall"  . Penicillins Rash    Has patient had a PCN reaction causing immediate rash, facial/tongue/throat swelling, SOB or lightheadedness with hypotension: Yes Has patient had a PCN reaction causing severe rash involving mucus membranes or skin necrosis: No Has patient had a PCN reaction that required hospitalization: No Has patient had a PCN reaction occurring within the last 10 years: No If all of the above answers are "NO", then may proceed with Cephalosporin use.  . Sulfa  Antibiotics Rash    VITALS:  Blood pressure 129/63, pulse 91, temperature 99.1 F (37.3 C), temperature source Oral, resp. rate 16, height 5\' 2"  (1.575 m), weight 80.7 kg (178 lb), SpO2 93 %.  PHYSICAL EXAMINATION:  Constitutional: Appears well-developed and well-nourished. No distress. HENT: Normocephalic. Marland Kitchen. Oropharynx is clear and moist.  Eyes: Conjunctivae and EOM are normal. PERRLA, no scleral icterus.  Neck: Normal ROM. Neck supple. No JVD. No tracheal deviation. CVS: RRR, S1/S2 +, no murmurs, no gallops, no carotid bruit.  Pulmonary: Effort and breath sounds normal, no stridor, rhonchi, wheezes, rales.  Abdominal: Soft. BS +,  no distension, tenderness, rebound or guarding.  Musculoskeletal: Normal range of motion. No edema and no tenderness.  Neuro: Alert. CN 2-12 grossly intact. No focal deficits. Skin: Skin is warm and dry. No rash noted. Psychiatric: Normal mood and affect.      LABORATORY PANEL:   CBC Recent Labs  Lab 04/17/17 0538  WBC 6.4  HGB 10.2*  HCT 29.9*  PLT 248   ------------------------------------------------------------------------------------------------------------------  Chemistries  Recent Labs  Lab 04/17/17 0538  NA 125*  K 3.3*  CL 88*  CO2 26  GLUCOSE 105*  BUN 16  CREATININE 1.07*  CALCIUM 8.6*   ------------------------------------------------------------------------------------------------------------------  Cardiac Enzymes Recent Labs  Lab 04/16/17 1234 04/16/17 1720 04/16/17 2322  TROPONINI <0.03 <0.03 <0.03   ------------------------------------------------------------------------------------------------------------------  RADIOLOGY:  Dg Chest 2 View  Result Date: 04/16/2017 CLINICAL DATA:  Wheezing, shortness of breath, and nonproductive cough for 1 week. Hypertension. EXAM: CHEST  2 VIEW COMPARISON:  03/10/2015 FINDINGS: Mild cardiac enlargement. Central pulmonary vascular congestion. Linear scarring in the mid  lungs  and lung bases is unchanged since prior study. No focal consolidation. Calcified and ectatic aorta. Degenerative changes in the spine and shoulders. IMPRESSION: Cardiac enlargement with mild vascular congestion. No edema or consolidation. Persistent linear scarring in the mid lungs and lung bases. Electronically Signed   By: Burman NievesWilliam  Stevens M.D.   On: 04/16/2017 06:10     ASSESSMENT AND PLAN:   71 year old female with poorly controlled hypertension, mild intermittent asthma, ongoing tobacco dependence and chronic pain syndrome who presents with shortness of breath and wheezing.  1. Shortness of breath: Patient symptoms have improved with IV Lasix. Echocardiogram is pending to evaluate for underlying CHF. Patient has been value by cardiology.  2. Hyponatremia: This can be due to CHF or from HCTZ HCTZ discontinued Sodium level still 125 this morning. Await nephrology consultation for further recommendations.  3. Anemia of chronic disease: Anemia panel appears to show chronic disease.  4. Mild intermittent asthma, chronic: Start DuoNeb's  There are no signs of exacerbation at this time  5. essential hypertension: Continue Norvasc, clonidine patch, Coreg and losartan Follow blood pressure  6. Chronic pain syndrome: Continue home medications  7. Hypokalemia: Replete and recheck in a.m.  8. Acute kidney injury: This is improved and may be due to prerenal azotemia.     Management plans discussed with the patient and daughter and she is in agreement.  CODE STATUS: full  TOTAL TIME TAKING CARE OF THIS PATIENT: 30 minutes.     POSSIBLE D/C 1-2 days, DEPENDING ON CLINICAL CONDITION.   Kristee Angus M.D on 04/17/2017 at 10:15 AM  Between 7am to 6pm - Pager - [phone] After 6pm go to [email or web address] - Social research officer, governmentpassword EPAS [email or web address]  [facility]  Office  [phone]  CC: Primary care physician; Lauro RegulusAnderson, Marshall W, MD  Note: This dictation was prepared with  Dragon dictation along with smaller phrase technology. Any transcriptional errors that result from this process are unintentional.

## 2017-04-17 NOTE — Plan of Care (Signed)
  Progressing Education: Knowledge of General Education information will improve 04/17/2017 1659 - Progressing by Kathreen CosierMalcolm, Jawanda Passey A, RN Health Behavior/Discharge Planning: Ability to manage health-related needs will improve 04/17/2017 1659 - Progressing by Kathreen CosierMalcolm, Shylin Keizer A, RN Clinical Measurements: Ability to maintain clinical measurements within normal limits will improve 04/17/2017 1659 - Progressing by Kathreen CosierMalcolm, Shimon Trowbridge A, RN Will remain free from infection 04/17/2017 1659 - Progressing by Kathreen CosierMalcolm, Nara Paternoster A, RN Diagnostic test results will improve 04/17/2017 1659 - Progressing by Kathreen CosierMalcolm, Demauri Advincula A, RN Respiratory complications will improve 04/17/2017 1659 - Progressing by Kathreen CosierMalcolm, Vidya Bamford A, RN Cardiovascular complication will be avoided 04/17/2017 1659 - Progressing by Kathreen CosierMalcolm, Dodger Sinning A, RN Activity: Risk for activity intolerance will decrease 04/17/2017 1659 - Progressing by Kathreen CosierMalcolm, Mearl Olver A, RN Coping: Level of anxiety will decrease 04/17/2017 1659 - Progressing by Kathreen CosierMalcolm, Lysbeth Dicola A, RN Pain Managment: General experience of comfort will improve 04/17/2017 1659 - Progressing by Kathreen CosierMalcolm, Ariq Khamis A, RN

## 2017-04-17 NOTE — Progress Notes (Signed)
[facility]  ROUNDING NOTE   Subjective:   Family at bedside.  Na 125   Breathing better. Edema improved.   Objective:  Vital signs in last 24 hours:  Temp:  [97.6 F (36.4 C)-99.1 F (37.3 C)] 99.1 F (37.3 C) (11/27 0410) Pulse Rate:  [75-91] 91 (11/27 0410) Resp:  [13-20] 16 (11/27 0410) BP: (97-129)/(50-63) 129/63 (11/27 0410) SpO2:  [93 %-99 %] 93 % (11/27 0724) Weight:  [80.7 kg (178 lb)-82.1 kg (180 lb 14.4 oz)] 80.7 kg (178 lb) (11/27 0410)  Weight change: 15.8 kg (34 lb 11.9 oz) Filed Weights   04/16/17 0912 04/16/17 1411 04/17/17 0410  Weight: 83.8 kg (184 lb 11.9 oz) 82.1 kg (180 lb 14.4 oz) 80.7 kg (178 lb)    Intake/Output: I/O last 3 completed shifts: In: -  Out: 1750 [Urine:1750]   Intake/Output this shift:  Total I/O In: 240 [P.O.:240] Out: 900 [Urine:900]  Physical Exam: General: NAD, laying in bed  Head: Normocephalic, atraumatic. Moist oral mucosal membranes  Eyes: Anicteric, PERRL  Neck: Supple, trachea midline  Lungs:  Clear to auscultation  Heart: Regular rate and rhythm  Abdomen:  Soft, nontender,   Extremities: trace peripheral edema.  Neurologic: Nonfocal, moving all four extremities  Skin: No lesions       Basic Metabolic Panel: Recent Labs  Lab 04/16/17 0606 04/17/17 0538  NA 125* 125*  K 3.5 3.3*  CL 89* 88*  CO2 25 26  GLUCOSE 116* 105*  BUN 22* 16  CREATININE 1.29* 1.07*  CALCIUM 8.6* 8.6*  MG  --  1.6*    Liver Function Tests: No results for input(s): AST, ALT, ALKPHOS, BILITOT, PROT, ALBUMIN in the last 168 hours. No results for input(s): LIPASE, AMYLASE in the last 168 hours. No results for input(s): AMMONIA in the last 168 hours.  CBC: Recent Labs  Lab 04/16/17 0539 04/17/17 0538  WBC 7.1 6.4  HGB 10.8* 10.2*  HCT 31.4* 29.9*  MCV 77.1* 76.6*  PLT 243 248    Cardiac Enzymes: Recent Labs  Lab 04/16/17 0606 04/16/17 1234 04/16/17 1720 04/16/17 2322  TROPONINI <0.03 <0.03 <0.03  <0.03    BNP: Invalid input(s): POCBNP  CBG: No results for input(s): GLUCAP in the last 168 hours.  Microbiology: Results for orders placed or performed during the hospital encounter of 04/16/17  MRSA PCR Screening     Status: None   Collection Time: 04/16/17 10:30 AM  Result Value Ref Range Status   MRSA by PCR NEGATIVE NEGATIVE Final    Comment:        The GeneXpert MRSA Assay (FDA approved for NASAL specimens only), is one component of a comprehensive MRSA colonization surveillance program. It is not intended to diagnose MRSA infection nor to guide or monitor treatment for MRSA infections.     Coagulation Studies: No results for input(s): LABPROT, INR in the last 72 hours.  Urinalysis: Recent Labs    04/16/17 1503  COLORURINE STRAW*  LABSPEC 1.004*  PHURINE 5.0  GLUCOSEU NEGATIVE  HGBUR NEGATIVE  BILIRUBINUR NEGATIVE  KETONESUR NEGATIVE  PROTEINUR NEGATIVE  NITRITE NEGATIVE  LEUKOCYTESUR NEGATIVE      Imaging: Dg Chest 2 View  Result Date: 04/16/2017 CLINICAL DATA:  Wheezing, shortness of breath, and nonproductive cough for 1 week. Hypertension. EXAM: CHEST  2 VIEW COMPARISON:  03/10/2015 FINDINGS: Mild cardiac enlargement. Central pulmonary vascular congestion. Linear scarring in the mid lungs and lung bases is unchanged since prior study. No focal consolidation. Calcified and  ectatic aorta. Degenerative changes in the spine and shoulders. IMPRESSION: Cardiac enlargement with mild vascular congestion. No edema or consolidation. Persistent linear scarring in the mid lungs and lung bases. Electronically Signed   By: Burman NievesWilliam  Stevens M.D.   On: 04/16/2017 06:10     Medications:    . benzonatate  100 mg Oral TID  . enoxaparin (LOVENOX) injection  40 mg Subcutaneous Q24H  . ipratropium-albuterol  3 mL Nebulization Q6H  . losartan  100 mg Oral Daily  . nicotine  14 mg Transdermal Daily  . pantoprazole  40 mg Oral Daily  . pregabalin  50 mg Oral TID    acetaminophen **OR** acetaminophen, bisacodyl, ketorolac, ondansetron **OR** ondansetron (ZOFRAN) IV, oxyCODONE, phenol, senna-docusate  Assessment/ Plan:  Ms. Samuel GermanyDorothy Mandato is a 71 y.o.  female Ms. Samuel GermanyDorothy Bettendorf is a 71 y.o. white female with COPD/asthma, hypertension, GERD, osteoarthritis, osteoporosis, hysterectomy, chronic pain syndrome who was [facility] for Shortness of breath [R06.02] Hyponatremia [E87.1]   1. Hyponatremia: hypervolemic on examination. Acute on chronic. Baseline sodium of 130 04/11/17.  - Agree with discontinued hydrochlorothiazide - Continue fluid restriction - May need to start loop diuretic. Patient states she has been on furosemide in the past.   2. Hypertension: with concerns for underlying congestive heart failure. Hypotensive on admission.  - Home regimen of amlodipine, carvedilol, clonidine, losartan and hydrochlorothiazide.  - Holding hydrochlorothiazide as above.  - Currently only on losartan with good blood pressure control.   3. Chronic kidney disease stage III: baseline creatinine of 1.1, GFR of 49 on 11/21.  Creatinine at baseline.  Secondary to hypertension     LOS: 1 Izzy Doubek 11/27/20181:21 PM

## 2017-04-17 NOTE — Plan of Care (Signed)
Seen by Nephrology and Cardiology yesterday. Education: Knowledge of General Education information will improve 04/17/2017 0219 - Progressing by Geri SeminoleJackson, Orley Lawry A, RN   Health Behavior/Discharge Planning: Ability to manage health-related needs will improve 04/17/2017 0219 - Progressing by Geri SeminoleJackson, Roniesha Hollingshead A, RN   Clinical Measurements: Ability to maintain clinical measurements within normal limits will improve 04/17/2017 0219 - Progressing by Geri SeminoleJackson, Arienne Gartin A, RN Will remain free from infection 04/17/2017 0219 - Progressing by Geri SeminoleJackson, Julia Alkhatib A, RN Diagnostic test results will improve 04/17/2017 0219 - Progressing by Geri SeminoleJackson, Batsheva Stevick A, RN Respiratory complications will improve 04/17/2017 0219 - Progressing by Geri SeminoleJackson, Torrin Crihfield A, RN Cardiovascular complication will be avoided 04/17/2017 0219 - Progressing by Geri SeminoleJackson, Myeesha Shane A, RN   Clinical Measurements: Will remain free from infection 04/17/2017 0219 - Progressing by Geri SeminoleJackson, Princesa Willig A, RN   Clinical Measurements: Diagnostic test results will improve 04/17/2017 0219 - Progressing by Geri SeminoleJackson, Erico Stan A, RN   Clinical Measurements: Respiratory complications will improve 04/17/2017 0219 - Progressing by Geri SeminoleJackson, Delfina Schreurs A, RN   Clinical Measurements: Cardiovascular complication will be avoided 04/17/2017 0219 - Progressing by Geri SeminoleJackson, Alixandra Alfieri A, RN   Activity: Risk for activity intolerance will decrease 04/17/2017 0219 - Progressing by Geri SeminoleJackson, Yulonda Wheeling A, RN   Coping: Level of anxiety will decrease 04/17/2017 0219 - Progressing by Geri SeminoleJackson, Kassidi Elza A, RN   Pain Managment: General experience of comfort will improve 04/17/2017 0219 - Progressing by Geri SeminoleJackson, Cedar Roseman A, RN

## 2017-04-17 NOTE — Progress Notes (Signed)
*  PRELIMINARY RESULTS* Echocardiogram 2D Echocardiogram has been performed.  Jennifer Rivers, Jennifer Rivers 04/17/2017, 4:49 PM

## 2017-04-18 LAB — BASIC METABOLIC PANEL
Anion gap: 9 (ref 5–15)
BUN: 15 mg/dL (ref 6–20)
CHLORIDE: 94 mmol/L — AB (ref 101–111)
CO2: 26 mmol/L (ref 22–32)
CREATININE: 0.79 mg/dL (ref 0.44–1.00)
Calcium: 8.8 mg/dL — ABNORMAL LOW (ref 8.9–10.3)
GFR calc Af Amer: >60 mL/min (ref >60)
Glucose, Bld: 105 mg/dL — ABNORMAL HIGH (ref 65–99)
POTASSIUM: 3.8 mmol/L (ref 3.5–5.1)
Sodium: 129 mmol/L — ABNORMAL LOW (ref 135–145)

## 2017-04-18 LAB — ECHOCARDIOGRAM COMPLETE
Height: 62 in
Weight: 2848 oz

## 2017-04-18 MED ORDER — AZITHROMYCIN 500 MG PO TABS
500.0000 mg | ORAL_TABLET | Freq: Once | ORAL | Status: AC
  Administered 2017-04-18: 10:00:00 500 mg via ORAL
  Filled 2017-04-18: qty 1

## 2017-04-18 MED ORDER — AZITHROMYCIN 250 MG PO TABS: 250.0000 mg | ORAL_TABLET | Freq: Every day | ORAL | 0 refills | Status: DC

## 2017-04-18 MED ORDER — ZOLPIDEM TARTRATE 5 MG PO TABS: 5.0000 mg | ORAL_TABLET | Freq: Every evening | ORAL | 0 refills | Status: DC | PRN

## 2017-04-18 MED ORDER — BENZONATATE 100 MG PO CAPS: 100.0000 mg | ORAL_CAPSULE | Freq: Three times a day (TID) | ORAL | 0 refills | Status: DC

## 2017-04-18 MED ORDER — NICOTINE 14 MG/24HR TD PT24: 14.0000 mg | MEDICATED_PATCH | Freq: Every day | TRANSDERMAL | 0 refills | Status: DC

## 2017-04-18 NOTE — Discharge Instructions (Signed)
Hyponatremia °Hyponatremia is when the amount of salt (sodium) in your blood is too low. When sodium levels are low, your cells absorb extra water and they swell. The swelling happens throughout the body, but it mostly affects the brain. °What are the causes? °This condition may be caused by: °· Heart, kidney, or liver problems. °· Thyroid problems. °· Adrenal gland problems. °· Metabolic conditions, such as syndrome of inappropriate antidiuretic hormone (SIADH). °· Severe vomiting and diarrhea. °· Certain medicines or illegal drugs. °· Dehydration. °· Drinking too much water. °· Eating a diet that is low in sodium. °· Large burns on your body. °· Sweating. ° °What increases the risk? °This condition is more likely to develop in people who: °· Have long-term (chronic) kidney disease. °· Have heart failure. °· Have a medical condition that causes frequent or excessive diarrhea. °· Have metabolic conditions, such as Addison disease or SIADH. °· Take certain medicines that affect the sodium and fluid balance in the blood. Some of these medicine types include: °? Diuretics. °? NSAIDs. °? Some opioid pain medicines. °? Some antidepressants. °? Some seizure prevention medicines. ° °What are the signs or symptoms? °Symptoms of this condition include: °· Nausea and vomiting. °· Confusion. °· Lethargy. °· Agitation. °· Headache. °· Seizures. °· Unconsciousness. °· Appetite loss. °· Muscle weakness and cramping. °· Feeling weak or light-headed. °· Having a rapid heart rate. °· Fainting, in severe cases. ° °How is this diagnosed? °This condition is diagnosed with a medical history and physical exam. You will also have other tests, including: °· Blood tests. °· Urine tests. ° °How is this treated? °Treatment for this condition depends on the cause. Treatment may include: °· Fluids given through an IV tube that is inserted into one of your veins. °· Medicines to correct the sodium imbalance. If medicines are causing the  condition, the medicines will need to be adjusted. °· Limiting water or fluid intake to get the correct sodium balance. ° °Follow these instructions at home: °· Take medicines only as directed by your health care provider. Many medicines can make this condition worse. Talk with your health care provider about any medicines that you are currently taking. °· Carefully follow a recommended diet as directed by your health care provider. °· Carefully follow instructions from your health care provider about fluid restrictions. °· Keep all follow-up visits as directed by your health care provider. This is important. °· Do not drink alcohol. °Contact a health care provider if: °· You develop worsening nausea, fatigue, headache, confusion, or weakness. °· Your symptoms go away and then return. °· You have problems following the recommended diet. °Get help right away if: °· You have a seizure. °· You faint. °· You have ongoing diarrhea or vomiting. °This information is not intended to replace advice given to you by your health care provider. Make sure you discuss any questions you have with your health care provider. °Document Released: 04/28/2002 Document Revised: 10/14/2015 Document Reviewed: 05/28/2014 °Elsevier Interactive Patient Education © 2018 [employer] Inc[employer] ° °

## 2017-04-18 NOTE — Progress Notes (Signed)
[facility] [place] [facility]        Samuel GermanyDorothy Rivers was admitted to the Hospital on 04/16/2017 and Discharged  04/18/2017 and her daughter Jennifer Rivers should be excused from work   for 1 days starting 04/15/2017 , may return to work/school without any restrictions.  Call Adrian SaranSital Tamiki Kuba MD with questions.  Messi Twedt M.D on 04/18/2017,at 10:44 AM  [facility] [place] [facility]    Office  [phone]

## 2017-04-18 NOTE — Progress Notes (Signed)
[facility] [place] at [facility]        Samuel GermanyDorothy Rivers was admitted to the Hospital on 04/16/2017 and Discharged  04/18/2017 and her daughter Jennifer StanleyLisa should be excused from work   for 3 days starting 04/16/2017 , may return to work/school without any restrictions.  Call Adrian SaranSital Ishani Goldwasser MD with questions.  Aryona Sill M.D on 04/18/2017,at 10:33 AM  [facility] [place] [facility]    Office  [phone]

## 2017-04-18 NOTE — Progress Notes (Signed)
[facility]  ROUNDING NOTE   Subjective:   Family at bedside.  Na 129  Echo within normal limits  Objective:  Vital signs in last 24 hours:  Temp:  [97.9 F (36.6 C)-98.2 F (36.8 C)] 97.9 F (36.6 C) (11/28 0747) Pulse Rate:  [82-95] 83 (11/28 0747) Resp:  [14-18] 14 (11/28 0747) BP: (119-132)/(56-68) 119/68 (11/28 0747) SpO2:  [92 %-97 %] 92 % (11/28 0747) Weight:  [79.3 kg (174 lb 12.8 oz)] 79.3 kg (174 lb 12.8 oz) (11/28 0500)  Weight change: -4.511 kg (-15.1 oz) Filed Weights   04/16/17 1411 04/17/17 0410 04/18/17 0500  Weight: 82.1 kg (180 lb 14.4 oz) 80.7 kg (178 lb) 79.3 kg (174 lb 12.8 oz)    Intake/Output: I/O last 3 completed shifts: In: 480 [P.O.:480] Out: 3100 [Urine:3100]   Intake/Output this shift:  No intake/output data recorded.  Physical Exam: General: NAD, laying in bed  Head: Normocephalic, atraumatic. Moist oral mucosal membranes  Eyes: Anicteric, PERRL  Neck: Supple, trachea midline  Lungs:  Clear to auscultation  Heart: Regular rate and rhythm  Abdomen:  Soft, nontender,   Extremities: No peripheral edema.  Neurologic: Nonfocal, moving all four extremities  Skin: No lesions       Basic Metabolic Panel: Recent Labs  Lab 04/16/17 0606 04/17/17 0538 04/18/17 0416  NA 125* 125* 129*  K 3.5 3.3* 3.8  CL 89* 88* 94*  CO2 25 26 26   GLUCOSE 116* 105* 105*  BUN 22* 16 15  CREATININE 1.29* 1.07* 0.79  CALCIUM 8.6* 8.6* 8.8*  MG  --  1.6*  --     Liver Function Tests: No results for input(s): AST, ALT, ALKPHOS, BILITOT, PROT, ALBUMIN in the last 168 hours. No results for input(s): LIPASE, AMYLASE in the last 168 hours. No results for input(s): AMMONIA in the last 168 hours.  CBC: Recent Labs  Lab 04/16/17 0539 04/17/17 0538  WBC 7.1 6.4  HGB 10.8* 10.2*  HCT 31.4* 29.9*  MCV 77.1* 76.6*  PLT 243 248    Cardiac Enzymes: Recent Labs  Lab 04/16/17 0606 04/16/17 1234 04/16/17 1720 04/16/17 2322   TROPONINI <0.03 <0.03 <0.03 <0.03    BNP: Invalid input(s): POCBNP  CBG: No results for input(s): GLUCAP in the last 168 hours.  Microbiology: Results for orders placed or performed during the hospital encounter of 04/16/17  MRSA PCR Screening     Status: None   Collection Time: 04/16/17 10:30 AM  Result Value Ref Range Status   MRSA by PCR NEGATIVE NEGATIVE Final    Comment:        The GeneXpert MRSA Assay (FDA approved for NASAL specimens only), is one component of a comprehensive MRSA colonization surveillance program. It is not intended to diagnose MRSA infection nor to guide or monitor treatment for MRSA infections.     Coagulation Studies: No results for input(s): LABPROT, INR in the last 72 hours.  Urinalysis: Recent Labs    04/16/17 1503  COLORURINE STRAW*  LABSPEC 1.004*  PHURINE 5.0  GLUCOSEU NEGATIVE  HGBUR NEGATIVE  BILIRUBINUR NEGATIVE  KETONESUR NEGATIVE  PROTEINUR NEGATIVE  NITRITE NEGATIVE  LEUKOCYTESUR NEGATIVE      Imaging: No results found.   Medications:    . benzonatate  100 mg Oral TID  . enoxaparin (LOVENOX) injection  40 mg Subcutaneous Q24H  . ipratropium-albuterol  3 mL Nebulization Q6H  . losartan  100 mg Oral Daily  . nicotine  14 mg Transdermal Daily  . pantoprazole  40 mg Oral Daily  . pregabalin  50 mg Oral TID   acetaminophen **OR** acetaminophen, bisacodyl, ketorolac, ondansetron **OR** ondansetron (ZOFRAN) IV, oxyCODONE, phenol, senna-docusate, zolpidem  Assessment/ Plan:  Jennifer Rivers is a 71 y.o.  female Jennifer Rivers is a 71 y.o. white female with COPD/asthma, hypertension, GERD, osteoarthritis, osteoporosis, hysterectomy, chronic pain syndrome who [facility] for Shortness of breath [R06.02] Hyponatremia [E87.1]   1. Hyponatremia: hypervolemic on examination. Acute on chronic. Baseline sodium of 130 04/11/17.  - Agree with discontinued hydrochlorothiazide - Continue fluid  restriction - May need to start loop diuretic. Patient states she has been on furosemide in the past.   2. Hypertension: Hypotensive on admission. Amlodipine causing vasodilation with pulmonary edema?  - Holding amlodipine, carvedilol, clonidine, and hydrochlorothiazide.  - Continue losartan only, will monitor volume status and blood pressure. History of difficult to control blood pressure.   3. Chronic kidney disease stage III: baseline creatinine of 1.1, GFR of 49 on 11/21.  Creatinine at baseline.  Secondary to hypertension     LOS: 2 Jennifer Rivers 11/28/201810:14 AM

## 2017-04-18 NOTE — Progress Notes (Signed)
Discharge instructions reviewed with patient. Patient verbalized understanding. Rx given to patient for Ambien. Patient reminded to take all belongings. Work note times two given for patient's daughters. Patient to be transported home by daughter via private vehicle.

## 2017-04-18 NOTE — Care Management Important Message (Signed)
Important Message  Patient Details  Name: Jennifer Rivers MRN: [id number] Date of Birth: 1945/11/03   Medicare Important Message Given:  Yes    Gwenette GreetBrenda S Jodell Weitman, RN 04/18/2017, 7:07 AM

## 2017-04-18 NOTE — Discharge Summary (Addendum)
[facility] [place] at [facility]   PATIENT NAME: Jennifer GermanyDorothy Rivers    MR#:  [id number]  DATE OF BIRTH:  1945/09/07  DATE OF ADMISSION:  04/16/2017 ADMITTING PHYSICIAN: Jennifer SaranSital Ammy Lienhard, MD  DATE OF DISCHARGE: 04/18/2017  PRIMARY CARE PHYSICIAN: Jennifer RegulusAnderson, Marshall W, MD    ADMISSION DIAGNOSIS:  Shortness of breath [R06.02] Hyponatremia [E87.1]  DISCHARGE DIAGNOSIS:  Active Problems:   Hyponatremia   SECONDARY DIAGNOSIS:   Past Medical History:  Diagnosis Date  . Asthma   . GERD (gastroesophageal reflux disease)   . Hypertension   . MRSA (methicillin resistant staph aureus) culture positive 08/2015  . Multilevel degenerative disc disease   . Osteoarthritis   . Osteoporosis     HOSPITAL COURSE:   71 year old female with poorly controlled hypertension, mild intermittent asthma, ongoing tobacco dependence and chronic pain syndrome who presents with shortness of breath and wheezing.  1. Shortness of breath:  the etiology of patient's shortness of breath was due to fluid overload. The etiology of fluid overload was due to Norvasc causing vasodilation. Her echocardiogram revealed normal ejection fraction and no diastolic dysfunction. Her shortness of breath has improved. She also has bronchitis, acute which is also continuing to shortness of breath. She will be discharged on oral azithromycin for the treatment of acute bronchitis.  2. Hyponatremia: This was due to hypervolemia from fluid overload in addition to HCTZ. She has been taken off of HCTZ. She'll follow with nephrology in 3 days.  3. Anemia of chronic disease: Anemia panel appears to show chronic disease.  4. Mild intermittent asthma, chronic: There are no signs of exacerbation at this time  5. essential hypertension: Over the past few weeks patient has had some issues with her blood pressure. She was on several medications prior to hospitalization. The etiology of her high blood pressure was most likely  due to fluid overload. Now that she has diuresed with IV Lasix her blood pressure remains low/normal. She will be discharged only on losartan with follow-up with nephrology in 2-3 days. She should discontinue HCTZ and Norvasc as HCTZ causes hyponatremia Norvasc is most likely the culprit for fluid overload as discussed earlier in this discharge summary.   6. Chronic pain syndrome: Continue home medications  7. Hypokalemia: This was repleted. 8. Acute kidney injury: This is improved and may be due to prerenal azotemia     DISCHARGE CONDITIONS AND DIET:   Stable for discharge on cardiac diet  CONSULTS OBTAINED:  Treatment Team:  Lamont DowdyKolluru, Sarath, MD Sondra Bargesunn, Ryan M, PA-C  DRUG ALLERGIES:   Allergies  Allergen Reactions  . Bupropion Other (See Comments)  . Codeine     Feels like an elephant is sitting on my chest  . Gabapentin Other (See Comments)    Altered mental status  . Tramadol Nausea Only    "Makes me feel dizzy and like I am going to fall"  . Penicillins Rash    Has patient had a PCN reaction causing immediate rash, facial/tongue/throat swelling, SOB or lightheadedness with hypotension: Yes Has patient had a PCN reaction causing severe rash involving mucus membranes or skin necrosis: No Has patient had a PCN reaction that required hospitalization: No Has patient had a PCN reaction occurring within the last 10 years: No If all of the above answers are "NO", then may proceed with Cephalosporin use.  . Sulfa Antibiotics Rash    DISCHARGE MEDICATIONS:   Current Discharge Medication List    START taking these medications   Details  azithromycin (ZITHROMAX) 250 MG tablet Take 1 tablet (250 mg total) by mouth daily. Qty: 6 each, Refills: 0    benzonatate (TESSALON) 100 MG capsule Take 1 capsule (100 mg total) by mouth 3 (three) times daily. Qty: 20 capsule, Refills: 0    nicotine (NICODERM CQ - DOSED IN MG/24 HOURS) 14 mg/24hr patch Place 1 patch (14 mg total) onto  the skin daily. Qty: 28 patch, Refills: 0    zolpidem (AMBIEN) 5 MG tablet Take 1 tablet (5 mg total) by mouth at bedtime as needed for sleep. Qty: 10 tablet, Refills: 0      CONTINUE these medications which have NOT CHANGED   Details  omeprazole (PRILOSEC) 20 MG capsule Take 20 mg by mouth daily.     pregabalin (LYRICA) 50 MG capsule Take 1 capsule (50 mg total) 3 (three) times daily by mouth. Qty: 90 capsule, Refills: 2    albuterol (VENTOLIN HFA) 108 (90 BASE) MCG/ACT inhaler Take 2 puffs by mouth every 6 (six) hours as needed.    oxyCODONE (OXY IR/ROXICODONE) 5 MG immediate release tablet Take 1 tablet (5 mg total) every 6 (six) hours as needed by mouth for severe pain. Qty: 120 tablet, Refills: 0   Associated Diagnoses: Chronic pain syndrome      STOP taking these medications     amLODipine (NORVASC) 10 MG tablet      carvedilol (COREG) 25 MG tablet      cloNIDine (CATAPRES - DOSED IN MG/24 HR) 0.1 mg/24hr patch      losartan-hydrochlorothiazide (HYZAAR) 100-25 MG tablet           Today   CHIEF COMPLAINT:   Patient doing very well this morning. Still has a cough. Is ready for discharge home.   VITAL SIGNS:  Blood pressure 119/68, pulse 83, temperature 97.9 F (36.6 C), temperature source Tympanic, resp. rate 14, height 5\' 2"  (1.575 m), weight 79.3 kg (174 lb 12.8 oz), SpO2 92 %.   REVIEW OF SYSTEMS:  Review of Systems  Constitutional: Negative.  Negative for chills, fever and malaise/fatigue.  HENT: Negative.  Negative for ear discharge, ear pain, hearing loss, nosebleeds and sore throat.   Eyes: Negative.  Negative for blurred vision and pain.  Respiratory: Positive for cough. Negative for hemoptysis, shortness of breath and wheezing.   Cardiovascular: Negative.  Negative for chest pain, palpitations and leg swelling.  Gastrointestinal: Negative.  Negative for abdominal pain, blood in stool, diarrhea, nausea and vomiting.  Genitourinary: Negative.   Negative for dysuria.  Musculoskeletal: Negative.  Negative for back pain.  Skin: Negative.   Neurological: Negative for dizziness, tremors, speech change, focal weakness, seizures and headaches.  Endo/Heme/Allergies: Negative.  Does not bruise/bleed easily.  Psychiatric/Behavioral: Negative.  Negative for depression, hallucinations and suicidal ideas.     PHYSICAL EXAMINATION:  GENERAL:  71 y.o.-year-old patient lying in the bed with no acute distress.  NECK:  Supple, no jugular venous distention. No thyroid enlargement, no tenderness.  LUNGS: Normal breath sounds bilaterally, no wheezing, rales,rhonchi  No use of accessory muscles of respiration.  CARDIOVASCULAR: S1, S2 normal. No murmurs, rubs, or gallops.  ABDOMEN: Soft, non-tender, non-distended. Bowel sounds present. No organomegaly or mass.  EXTREMITIES: No pedal edema, cyanosis, or clubbing.  PSYCHIATRIC: The patient is alert and oriented x 3.  SKIN: No obvious rash, lesion, or ulcer.   DATA REVIEW:   CBC Recent Labs  Lab 04/17/17 0538  WBC 6.4  HGB 10.2*  HCT 29.9*  PLT 248  Chemistries  Recent Labs  Lab 04/17/17 0538 04/18/17 0416  NA 125* 129*  K 3.3* 3.8  CL 88* 94*  CO2 26 26  GLUCOSE 105* 105*  BUN 16 15  CREATININE 1.07* 0.79  CALCIUM 8.6* 8.8*  MG 1.6*  --     Cardiac Enzymes Recent Labs  Lab 04/16/17 1234 04/16/17 1720 04/16/17 2322  TROPONINI <0.03 <0.03 <0.03    Microbiology Results  @MICRORSLT48 @  RADIOLOGY:  No results found.    Current Discharge Medication List    START taking these medications   Details  azithromycin (ZITHROMAX) 250 MG tablet Take 1 tablet (250 mg total) by mouth daily. Qty: 6 each, Refills: 0    benzonatate (TESSALON) 100 MG capsule Take 1 capsule (100 mg total) by mouth 3 (three) times daily. Qty: 20 capsule, Refills: 0    nicotine (NICODERM CQ - DOSED IN MG/24 HOURS) 14 mg/24hr patch Place 1 patch (14 mg total) onto the skin daily. Qty: 28 patch,  Refills: 0    zolpidem (AMBIEN) 5 MG tablet Take 1 tablet (5 mg total) by mouth at bedtime as needed for sleep. Qty: 10 tablet, Refills: 0      CONTINUE these medications which have NOT CHANGED   Details  omeprazole (PRILOSEC) 20 MG capsule Take 20 mg by mouth daily.     pregabalin (LYRICA) 50 MG capsule Take 1 capsule (50 mg total) 3 (three) times daily by mouth. Qty: 90 capsule, Refills: 2    albuterol (VENTOLIN HFA) 108 (90 BASE) MCG/ACT inhaler Take 2 puffs by mouth every 6 (six) hours as needed.    oxyCODONE (OXY IR/ROXICODONE) 5 MG immediate release tablet Take 1 tablet (5 mg total) every 6 (six) hours as needed by mouth for severe pain. Qty: 120 tablet, Refills: 0   Associated Diagnoses: Chronic pain syndrome      STOP taking these medications     amLODipine (NORVASC) 10 MG tablet      carvedilol (COREG) 25 MG tablet      cloNIDine (CATAPRES - DOSED IN MG/24 HR) 0.1 mg/24hr patch      losartan-hydrochlorothiazide (HYZAAR) 100-25 MG tablet             Management plans discussed with the patient and she is in agreement. Stable for discharge home  Patient should follow up with pcp  CODE STATUS:     Code Status Orders  (From admission, onward)        Start     Ordered   04/16/17 0920  Full code  Continuous     04/16/17 0919    Code Status History    Date Active Date Inactive Code Status Order ID Comments User Context   03/11/2015 04:20 03/11/2015 21:06 Full Code [id number]  Arnaldo Natal, MD ED      TOTAL TIME TAKING CARE OF THIS PATIENT: 37 minutes.    Note: This dictation was prepared with Dragon dictation along with smaller phrase technology. Any transcriptional errors that result from this process are unintentional.  Colene Mines M.D on 04/18/2017 at 10:34 AM  Between 7am to 6pm - Pager - [phone] After 6pm go to [email or web address] - Social research officer, government  [facility]  Office  [phone]  CC: Primary care physician;  Jennifer Regulus, MD

## 2017-05-30 DIAGNOSIS — M48061 Spinal stenosis, lumbar region without neurogenic claudication: Secondary | ICD-10-CM | POA: Insufficient documentation

## 2017-05-30 DIAGNOSIS — M48062 Spinal stenosis, lumbar region with neurogenic claudication: Secondary | ICD-10-CM | POA: Insufficient documentation

## 2017-05-30 NOTE — Progress Notes (Signed)
Patient's Name: Jennifer Rivers  MRN: [id number]  Referring Provider: Lauro Regulus, MD  DOB: 20-Oct-1945  PCP: Lauro Regulus, MD  DOS: 05/31/2017  Note by: Oswaldo Done, MD  Service setting: Ambulatory outpatient  Specialty: Interventional Pain Management  Patient type: Established  Location: [facility] (AMB) Pain Management Facility  Visit type: Interventional Procedure   Primary Reason for Visit: Interventional Pain Management Treatment. CC: Back Pain (lower); Leg Pain (both); and Arm Pain (both)  Procedure:  Anesthesia, Analgesia, Anxiolysis:  Procedure #1: Type: Diagnostic Inter-Laminar Epidural Steroid Injection Region: Lumbar Level: L3-4 Level. Laterality: Right-Sided          Procedure #2: Type: Diagnostic Trans-Foraminal Epidural Steroid Injection Region: Lumbar Level: L5 Paravertebral Laterality: Right-Sided Paravertebral Position: Prone  Type: Local Anesthesia with Moderate (Conscious) Sedation Local Anesthetic: Lidocaine 1% Route: Intravenous (IV) IV Access: Secured Sedation: Meaningful verbal contact was maintained at all times during the procedure  Indication(s): Analgesia and Anxiety   Indications: 1. Chronic lower extremity radicular pain (Bilateral) (R>L)   2. Chronic low back pain (Location of Secondary source of pain) (Bilateral) (L>R)   3. Chronic lower extremity pain (Bilateral) (R>L)   4. 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 Chronic radicular pain of lower extremity   7. Chronic bilateral low back pain without sciatica   8. Chronic pain of lower extremity, bilateral   9. Lumbar foraminal stenosis   10. Spinal stenosis of lumbar region with neurogenic claudication    Pain Score: Pre-procedure: 5 /10 Post-procedure: 0-No pain/10  Pre-op Assessment:  Jennifer Rivers is a 72 y.o. (year old), female patient, seen today for interventional treatment. She  has a past surgical history that includes  Abdominal hysterectomy; Tonsillectomy; and Knee arthroscopy (Right, 09/15/2015). Jennifer Rivers has a current medication list which includes the following prescription(s): albuterol, benzonatate, omeprazole, oxycodone, furosemide, losartan, and pregabalin, and the following Facility-Administered Medications: fentanyl, lactated ringers, lidocaine, and midazolam. Her primarily concern today is the Back Pain (lower); Leg Pain (both); and Arm Pain (both)  Initial Vital Signs: There were no vitals taken for this visit. BMI: Estimated body mass index is 29.26 kg/m as calculated from the following:   Height as of this encounter: 5\' 2"  (1.575 m).   Weight as of this encounter: 160 lb (72.6 kg).  Risk Assessment: Allergies: Reviewed. She is allergic to bupropion; codeine; gabapentin; tramadol; penicillins; and sulfa antibiotics.  Allergy Precautions: None required Coagulopathies: Reviewed. None identified.  Blood-thinner therapy: None at this time Active Infection(s): Reviewed. None identified. Jennifer Rivers is afebrile  Site Confirmation: Jennifer Rivers was asked to confirm the procedure and laterality before marking the site Procedure checklist: Completed Consent: Before the procedure and under the influence of no sedative(s), amnesic(s), or anxiolytics, the patient was informed of the treatment options, risks and possible complications. To fulfill our ethical and legal obligations, as recommended by the American Medical Association's Code of Ethics, I have informed the patient of my clinical impression; the nature and purpose of the treatment or procedure; the risks, benefits, and possible complications of the intervention; the alternatives, including doing nothing; the risk(s) and benefit(s) of the alternative treatment(s) or procedure(s); and the risk(s) and benefit(s) of doing nothing. The patient was provided information about the general risks and possible complications associated with the procedure. These may  include, but are not limited to: failure to achieve desired goals, infection, bleeding, organ or nerve damage, allergic reactions, paralysis, and death. In addition,  the patient was informed of those risks and complications associated to Spine-related procedures, such as failure to decrease pain; infection (i.e.: Meningitis, epidural or intraspinal abscess); bleeding (i.e.: epidural hematoma, subarachnoid hemorrhage, or any other type of intraspinal or peri-dural bleeding); organ or nerve damage (i.e.: Any type of peripheral nerve, nerve root, or spinal cord injury) with subsequent damage to sensory, motor, and/or autonomic systems, resulting in permanent pain, numbness, and/or weakness of one or several areas of the body; allergic reactions; (i.e.: anaphylactic reaction); and/or death. Furthermore, the patient was informed of those risks and complications associated with the medications. These include, but are not limited to: allergic reactions (i.e.: anaphylactic or anaphylactoid reaction(s)); adrenal axis suppression; blood sugar elevation that in diabetics may result in ketoacidosis or comma; water retention that in patients with history of congestive heart failure may result in shortness of breath, pulmonary edema, and decompensation with resultant heart failure; weight gain; swelling or edema; medication-induced neural toxicity; particulate matter embolism and blood vessel occlusion with resultant organ, and/or nervous system infarction; and/or aseptic necrosis of one or more joints. Finally, the patient was informed that Medicine is not an exact science; therefore, there is also the possibility of unforeseen or unpredictable risks and/or possible complications that may result in a catastrophic outcome. The patient indicated having understood very clearly. We have given the patient no guarantees and we have made no promises. Enough time was given to the patient to ask questions, all of which were answered  to the patient's satisfaction. Jennifer Rivers has indicated that she wanted to continue with the procedure. Attestation: I, the ordering provider, attest that I have discussed with the patient the benefits, risks, side-effects, alternatives, likelihood of achieving goals, and potential problems during recovery for the procedure that I have provided informed consent. Date: 05/31/2017; Time: 1:33 PM  Pre-Procedure Preparation:  Monitoring: As per clinic protocol. Respiration, ETCO2, SpO2, BP, heart rate and rhythm monitor placed and checked for adequate function Safety Precautions: Patient was assessed for positional comfort and pressure points before starting the procedure. Time-out: I initiated and conducted the "Time-out" before starting the procedure, as per protocol. The patient was asked to participate by confirming the accuracy of the "Time Out" information. Verification of the correct person, site, and procedure were performed and confirmed by me, the nursing staff, and the patient. "Time-out" conducted as per Joint Commission's Universal Protocol (UP.01.01.01). "Time-out" Date & Time: 05/31/2017; 1419 hrs.  Description of Procedure #1 Process:   Target Area: The  interlaminar space, initially targeting the lower border of the superior vertebral body lamina. Approach: Posterior paramedial approach. Area Prepped: Entire Posterior Lumbosacral Region Prepping solution: ChloraPrep (2% chlorhexidine gluconate and 70% isopropyl alcohol) Safety Precautions: Aspiration looking for blood return was conducted prior to all injections. At no point did we inject any substances, as a needle was being advanced. No attempts were made at seeking any paresthesias. Safe injection practices and needle disposal techniques used. Medications properly checked for expiration dates. SDV (single dose vial) medications used. Description of the Procedure: Protocol guidelines were followed. The patient was placed in position over  the fluoroscopy table. The target area was identified and the area prepped in the usual manner. Skin desensitized using vapocoolant spray. Skin & deeper tissues infiltrated with local anesthetic. Appropriate amount of time allowed to pass for local anesthetics to take effect. The procedure needle was introduced through the skin, ipsilateral to the reported pain, and advanced to the target area. Bone was contacted and the needle walked  caudad, until the lamina was cleared. The ligamentum flavum was engaged and loss-of-resistance technique used as the epidural needle was advanced. The epidural space was identified using "loss-of-resistance technique" with 2-3 ml of PF-NaCl (0.9% NSS), in a 5cc LOR glass syringe. Proper needle placement secured. Negative aspiration confirmed. Solution injected in intermittent fashion, asking for systemic symptoms every 0.5cc of injectate. The needles were then removed and the area cleansed, making sure to leave some of the prepping solution back to take advantage of its long term bactericidal properties. Start Time: 1419 hrs. Materials:  Needle(s) Type: Epidural needle Gauge: 17G Length: 3.5-in Medication(s): We administered iopamidol, midazolam, fentaNYL, lactated ringers, sodium chloride flush, ropivacaine (PF) 2 mg/mL (0.2%), dexamethasone, lidocaine, sodium chloride flush, ropivacaine (PF) 2 mg/mL (0.2%), and triamcinolone acetonide. Please see chart orders for dosing details.  Description of Procedure #2 Process:   Target Area: The inferior and lateral portion of the pedicle, just lateral to a line created by the 6:00 position of the pedicle and the superior articular process of the vertebral body below. On the lateral view, this target lies just posterior to the anterior aspect of the lamina and posterior to the midpoint created between the anterior and the posterior aspect of the neural foramina. Approach: Posterior paravertebral approach. Area Prepped: Same as  above Prepping solution: Same as above Safety Precautions: Same as above Description of the Procedure: Protocol guidelines were followed. The patient was placed in position over the fluoroscopy table. The target area was identified and the area prepped in the usual manner. Skin desensitized using vapocoolant spray. Skin & deeper tissues infiltrated with local anesthetic. Appropriate amount of time allowed to pass for local anesthetics to take effect. The procedure needles were then advanced to the target area. Proper needle placement secured. Negative aspiration confirmed. Solution injected in intermittent fashion, asking for systemic symptoms every 0.2cc of injectate. The needles were then removed and the area cleansed, making sure to leave some of the prepping solution back to take advantage of its long term bactericidal properties. Vitals:   05/31/17 1432 05/31/17 1442 05/31/17 1451 05/31/17 1501  BP: (!) 159/92 (!) 159/72 (!) 156/70 (!) 150/70  Pulse:      Resp: 15 14 14 16   Temp:      TempSrc:      SpO2: 97% 94% 95% 100%  Weight:      Height:        End Time: 1431 hrs. Materials:  Needle(s) Type: Regular needle Gauge: 22G Length: 3.5-in Medication(s): We administered iopamidol, midazolam, fentaNYL, lactated ringers, sodium chloride flush, ropivacaine (PF) 2 mg/mL (0.2%), dexamethasone, lidocaine, sodium chloride flush, ropivacaine (PF) 2 mg/mL (0.2%), and triamcinolone acetonide. Please see chart orders for dosing details.  Imaging Guidance (Spinal):  Type of Imaging Technique: Fluoroscopy Guidance (Spinal) Indication(s): Assistance in needle guidance and placement for procedures requiring needle placement in or near specific anatomical locations not easily accessible without such assistance. Exposure Time: Please see nurses notes. Contrast: Before injecting any contrast, we confirmed that the patient did not have an allergy to iodine, shellfish, or radiological contrast. Once  satisfactory needle placement was completed at the desired level, radiological contrast was injected. Contrast injected under live fluoroscopy. No contrast complications. See chart for type and volume of contrast used. Fluoroscopic Guidance: I was personally present during the use of fluoroscopy. "Tunnel Vision Technique" used to obtain the best possible view of the target area. Parallax error corrected before commencing the procedure. "Direction-depth-direction" technique used to introduce the needle under  continuous pulsed fluoroscopy. Once target was reached, antero-posterior, oblique, and lateral fluoroscopic projection used confirm needle placement in all planes. Images permanently stored in EMR. Interpretation: I personally interpreted the imaging intraoperatively. Adequate needle placement confirmed in multiple planes. Appropriate spread of contrast into desired area was observed. No evidence of afferent or efferent intravascular uptake. No intrathecal or subarachnoid spread observed. Permanent images saved into the patient's record.  Antibiotic Prophylaxis:  Indication(s): None identified Antibiotic given: None  Post-operative Assessment:  EBL: None Complications: No immediate post-treatment complications observed by team, or reported by patient. Note: The patient tolerated the entire procedure well. A repeat set of vitals were taken after the procedure and the patient was kept under observation following institutional policy, for this type of procedure. Post-procedural neurological assessment was performed, showing return to baseline, prior to discharge. The patient was provided with post-procedure discharge instructions, including a section on how to identify potential problems. Should any problems arise concerning this procedure, the patient was given instructions to immediately contact us, at any time, without hesitation. In any case, we plan to contact the patient by telephone for a follow-up  status report regarding this interventional procedure. Comments:  No additional relevant information.  Plan of Care    Imaging Orders     DG C-Arm 1-60 Min-No Report  Procedure Orders     Lumbar Epidural Injection     Lumbar Transforaminal Epidural  Medications ordered for procedure: Meds ordered this encounter  Medications  . iopamidol (ISOVUE-M) 41 % intrathecal injection 10 mL    Must be myelogram compatible.  . midazolam (VERSED) 5 MG/5ML injection 1-2 mg    Make sure Flumazenil is available in the pyxis when using this medication. If oversedation occurs, administer 0.2 mg IV over 15 sec. If after 45 sec no response, administer 0.2 mg again over 1 min; may repeat at 1 min intervals; not to exceed 4 doses (1 mg)  . fentaNYL (SUBLIMAZE) injection 25-50 mcg    Make sure Narcan is available in the pyxis when using this medication. In the event of respiratory depression (RR< 8/min): Titrate NARCAN (naloxone) in increments of 0.1 to 0.2 mg IV at 2-3 minute intervals, until desired degree of reversal.  . lactated ringers infusion 1,000 mL  . lidocaine (XYLOCAINE) 2 % (with pres) injection 400 mg  . sodium chloride flush (NS) 0.9 % injection 1 mL  . ropivacaine (PF) 2 mg/mL (0.2%) (NAROPIN) injection 1 mL  . dexamethasone (DECADRON) injection 10 mg  . lidocaine (XYLOCAINE) 2 % (with pres) injection 200 mg  . sodium chloride flush (NS) 0.9 % injection 2 mL  . ropivacaine (PF) 2 mg/mL (0.2%) (NAROPIN) injection 2 mL  . triamcinolone acetonide (KENALOG-40) injection 40 mg   Medications administered: We administered iopamidol, midazolam, fentaNYL, lactated ringers, sodium chloride flush, ropivacaine (PF) 2 mg/mL (0.2%), dexamethasone, lidocaine, sodium chloride flush, ropivacaine (PF) 2 mg/mL (0.2%), and triamcinolone acetonide.  See the medical record for exact dosing, route, and time of administration.  New Prescriptions   No medications on file   Disposition: Discharge home   Discharge Date & Time: 05/31/2017; 1505 hrs.   Physician-requested Follow-up: Return for post-procedure eval (2 wks), w/ Dr. Laban Emperor. Future Appointments  Date Time Provider Department Center  06/18/2017  1:30 PM Delano Metz, MD [facility] None  07/10/2017  1:45 PM Barbette Merino, NP [facility] None   Primary Care Physician: Lauro Regulus, MD Location: [facility] Outpatient Pain Management Facility Note by: Oswaldo Done, MD Date:  05/31/2017; Time: 3:15 PM  Disclaimer:  Medicine is not an Visual merchandiserexact science. The only guarantee in medicine is that nothing is guaranteed. It is important to note that the decision to proceed with this intervention was based on the information collected from the patient. The Data and conclusions were drawn from the patient's questionnaire, the interview, and the physical examination. Because the information was provided in large part by the patient, it cannot be guaranteed that it has not been purposely or unconsciously manipulated. Every effort has been made to obtain as much relevant data as possible for this evaluation. It is important to note that the conclusions that lead to this procedure are derived in large part from the available data. Always take into account that the treatment will also be dependent on availability of resources and existing treatment guidelines, considered by other Pain Management Practitioners as being common knowledge and practice, at the time of the intervention. For Medico-Legal purposes, it is also important to point out that variation in procedural techniques and pharmacological choices are the acceptable norm. The indications, contraindications, technique, and results of the above procedure should only be interpreted and judged by a Board-Certified Interventional Pain Specialist with extensive familiarity and expertise in the same exact procedure and technique.

## 2017-05-31 ENCOUNTER — Encounter: Payer: Self-pay | Admitting: Pain Medicine

## 2017-05-31 ENCOUNTER — Other Ambulatory Visit: Payer: Self-pay

## 2017-05-31 ENCOUNTER — Ambulatory Visit (HOSPITAL_BASED_OUTPATIENT_CLINIC_OR_DEPARTMENT_OTHER): Payer: Medicare HMO | Admitting: Pain Medicine

## 2017-05-31 ENCOUNTER — Ambulatory Visit
Admission: RE | Admit: 2017-05-31 | Discharge: 2017-05-31 | Disposition: A | Discharge status: Home / Self Care | Payer: Medicare HMO | Source: Ambulatory Visit | Attending: Pain Medicine | Admitting: Pain Medicine

## 2017-05-31 VITALS — BP 150/70 | HR 99 | Temp 98.0°F | Resp 16 | Ht 62.0 in | Wt 160.0 lb

## 2017-05-31 DIAGNOSIS — M545 Low back pain: Secondary | ICD-10-CM

## 2017-05-31 DIAGNOSIS — M48062 Spinal stenosis, lumbar region with neurogenic claudication: Secondary | ICD-10-CM

## 2017-05-31 DIAGNOSIS — G8929 Other chronic pain: Secondary | ICD-10-CM

## 2017-05-31 DIAGNOSIS — M79604 Pain in right leg: Secondary | ICD-10-CM | POA: Insufficient documentation

## 2017-05-31 DIAGNOSIS — M5410 Radiculopathy, site unspecified: Secondary | ICD-10-CM | POA: Diagnosis not present

## 2017-05-31 DIAGNOSIS — M79605 Pain in left leg: Secondary | ICD-10-CM | POA: Diagnosis not present

## 2017-05-31 DIAGNOSIS — M48061 Spinal stenosis, lumbar region without neurogenic claudication: Secondary | ICD-10-CM

## 2017-05-31 DIAGNOSIS — M5416 Radiculopathy, lumbar region: Secondary | ICD-10-CM | POA: Diagnosis not present

## 2017-05-31 MED ORDER — LIDOCAINE HCL 2 % IJ SOLN
20.0000 mL | Freq: Once | INTRAMUSCULAR | Status: DC
  Filled 2017-05-31: qty 40

## 2017-05-31 MED ORDER — IOPAMIDOL (ISOVUE-M 200) INJECTION 41%
10.0000 mL | Freq: Once | INTRAMUSCULAR | Status: AC
  Administered 2017-05-31: 10 mL via EPIDURAL
  Filled 2017-05-31: qty 10

## 2017-05-31 MED ORDER — DEXAMETHASONE SODIUM PHOSPHATE 10 MG/ML IJ SOLN
10.0000 mg | Freq: Once | INTRAMUSCULAR | Status: AC
  Administered 2017-05-31: 10 mg
  Filled 2017-05-31: qty 1

## 2017-05-31 MED ORDER — SODIUM CHLORIDE 0.9% FLUSH
2.0000 mL | Freq: Once | INTRAVENOUS | Status: AC
  Administered 2017-05-31: 10 mL

## 2017-05-31 MED ORDER — FENTANYL CITRATE (PF) 100 MCG/2ML IJ SOLN
25.0000 ug | INTRAMUSCULAR | Status: DC | PRN
  Administered 2017-05-31: 100 ug via INTRAVENOUS
  Filled 2017-05-31: qty 2

## 2017-05-31 MED ORDER — LACTATED RINGERS IV SOLN
1000.0000 mL | Freq: Once | INTRAVENOUS | Status: AC
  Administered 2017-05-31: 1000 mL via INTRAVENOUS

## 2017-05-31 MED ORDER — SODIUM CHLORIDE 0.9% FLUSH
1.0000 mL | Freq: Once | INTRAVENOUS | Status: AC
  Administered 2017-05-31: 10 mL

## 2017-05-31 MED ORDER — MIDAZOLAM HCL 5 MG/5ML IJ SOLN
1.0000 mg | INTRAMUSCULAR | Status: DC | PRN
  Administered 2017-05-31: 5 mg via INTRAVENOUS
  Filled 2017-05-31: qty 5

## 2017-05-31 MED ORDER — TRIAMCINOLONE ACETONIDE 40 MG/ML IJ SUSP
40.0000 mg | Freq: Once | INTRAMUSCULAR | Status: AC
  Administered 2017-05-31: 40 mg
  Filled 2017-05-31: qty 1

## 2017-05-31 MED ORDER — ROPIVACAINE HCL 2 MG/ML IJ SOLN
1.0000 mL | Freq: Once | INTRAMUSCULAR | Status: AC
  Administered 2017-05-31: 10 mL via EPIDURAL
  Filled 2017-05-31: qty 10

## 2017-05-31 MED ORDER — LIDOCAINE HCL 2 % IJ SOLN
10.0000 mL | Freq: Once | INTRAMUSCULAR | Status: AC
Start: 2017-05-31 — End: 2017-05-31
  Administered 2017-05-31: 400 mg

## 2017-05-31 MED ORDER — ROPIVACAINE HCL 2 MG/ML IJ SOLN
2.0000 mL | Freq: Once | INTRAMUSCULAR | Status: AC
  Administered 2017-05-31: 10 mL via EPIDURAL
  Filled 2017-05-31: qty 10

## 2017-05-31 NOTE — Progress Notes (Signed)
Safety precautions to be maintained throughout the outpatient stay will include: orient to surroundings, keep bed in low position, maintain call bell within reach at all times, provide assistance with transfer out of bed and ambulation.  

## 2017-05-31 NOTE — Patient Instructions (Signed)

## 2017-06-01 ENCOUNTER — Telehealth: Payer: Self-pay

## 2017-06-01 NOTE — Telephone Encounter (Signed)
Pt denies any needs at this time. Instructed to call if needed

## 2017-06-18 ENCOUNTER — Ambulatory Visit: Payer: Medicare HMO | Admitting: Pain Medicine

## 2017-07-06 ENCOUNTER — Other Ambulatory Visit: Payer: Self-pay | Admitting: Pain Medicine

## 2017-07-06 ENCOUNTER — Telehealth: Payer: Self-pay | Admitting: Nurse Practitioner

## 2017-07-06 DIAGNOSIS — M545 Low back pain, unspecified: Secondary | ICD-10-CM

## 2017-07-06 DIAGNOSIS — G8929 Other chronic pain: Secondary | ICD-10-CM

## 2017-07-06 MED ORDER — TIZANIDINE HCL 4 MG PO TABS: 4.0000 mg | ORAL_TABLET | Freq: Three times a day (TID) | ORAL | 0 refills | Status: AC | PRN

## 2017-07-06 NOTE — Progress Notes (Signed)
The patient called me today (Friday) indicating that her old pharmacy ([facility]) closed and she had left her prescriptions with them. She was unable to get her last script filled and ran out of medicine two days ago. She indicates that her prescriptions were transferred to [facility], except for her opioid. She calls me now indicating that she is having withdrawals from her oxycodone IR 5 mg q6 hrs. She says that she does not like [facility] and had her prescriptions transferred to [facility], in [place]. Her next oxycodone refill would be on 08/03/17. I told her that we would not be able to give her another prescription before then. However, I have e-scribed a prescription for tizanidine to her pharmacy. She takes blood pressure medicine and therefore tizanidine should have less effect on her blood pressure than the alternative (clonidine).

## 2017-07-06 NOTE — Telephone Encounter (Signed)
Patient iinstructed to call the [facility] which [facility] said they sent all their prescriptions.

## 2017-07-06 NOTE — Telephone Encounter (Signed)
[facility] is the new pharmacy for Jennifer Rivers Jennifer Rivers, ([facility] closed). Patient gave all her scripts to [facility] and cannot get them back. Patient needs new script for Oxycodone.  [facility] [phone]

## 2017-07-10 ENCOUNTER — Other Ambulatory Visit: Payer: Self-pay

## 2017-07-10 ENCOUNTER — Ambulatory Visit: Payer: Medicare HMO | Attending: Nurse Practitioner | Admitting: Nurse Practitioner

## 2017-07-10 ENCOUNTER — Encounter: Payer: Self-pay | Admitting: Nurse Practitioner

## 2017-07-10 DIAGNOSIS — G8929 Other chronic pain: Secondary | ICD-10-CM

## 2017-07-10 DIAGNOSIS — G894 Chronic pain syndrome: Secondary | ICD-10-CM | POA: Insufficient documentation

## 2017-07-10 DIAGNOSIS — F329 Major depressive disorder, single episode, unspecified: Secondary | ICD-10-CM | POA: Insufficient documentation

## 2017-07-10 DIAGNOSIS — M170 Bilateral primary osteoarthritis of knee: Secondary | ICD-10-CM | POA: Diagnosis not present

## 2017-07-10 DIAGNOSIS — Z888 Allergy status to other drugs, medicaments and biological substances status: Secondary | ICD-10-CM | POA: Diagnosis not present

## 2017-07-10 DIAGNOSIS — Z79899 Other long term (current) drug therapy: Secondary | ICD-10-CM | POA: Diagnosis not present

## 2017-07-10 DIAGNOSIS — Z79891 Long term (current) use of opiate analgesic: Secondary | ICD-10-CM | POA: Insufficient documentation

## 2017-07-10 DIAGNOSIS — F17210 Nicotine dependence, cigarettes, uncomplicated: Secondary | ICD-10-CM | POA: Diagnosis not present

## 2017-07-10 DIAGNOSIS — E871 Hypo-osmolality and hyponatremia: Secondary | ICD-10-CM | POA: Insufficient documentation

## 2017-07-10 DIAGNOSIS — Z885 Allergy status to narcotic agent status: Secondary | ICD-10-CM | POA: Insufficient documentation

## 2017-07-10 DIAGNOSIS — M160 Bilateral primary osteoarthritis of hip: Secondary | ICD-10-CM | POA: Diagnosis not present

## 2017-07-10 DIAGNOSIS — M48061 Spinal stenosis, lumbar region without neurogenic claudication: Secondary | ICD-10-CM | POA: Insufficient documentation

## 2017-07-10 DIAGNOSIS — K219 Gastro-esophageal reflux disease without esophagitis: Secondary | ICD-10-CM | POA: Insufficient documentation

## 2017-07-10 DIAGNOSIS — Z5181 Encounter for therapeutic drug level monitoring: Secondary | ICD-10-CM | POA: Insufficient documentation

## 2017-07-10 DIAGNOSIS — Z882 Allergy status to sulfonamides status: Secondary | ICD-10-CM | POA: Insufficient documentation

## 2017-07-10 DIAGNOSIS — M545 Low back pain, unspecified: Secondary | ICD-10-CM

## 2017-07-10 DIAGNOSIS — J449 Chronic obstructive pulmonary disease, unspecified: Secondary | ICD-10-CM | POA: Insufficient documentation

## 2017-07-10 DIAGNOSIS — I10 Essential (primary) hypertension: Secondary | ICD-10-CM | POA: Insufficient documentation

## 2017-07-10 DIAGNOSIS — M5412 Radiculopathy, cervical region: Secondary | ICD-10-CM | POA: Insufficient documentation

## 2017-07-10 DIAGNOSIS — Z880 Allergy status to penicillin: Secondary | ICD-10-CM | POA: Insufficient documentation

## 2017-07-10 MED ORDER — OXYCODONE HCL 5 MG PO TABS: 5.0000 mg | ORAL_TABLET | Freq: Four times a day (QID) | ORAL | 0 refills | Status: DC | PRN

## 2017-07-10 MED ORDER — PREGABALIN 50 MG PO CAPS: 50.0000 mg | ORAL_CAPSULE | Freq: Three times a day (TID) | ORAL | 2 refills | Status: DC

## 2017-07-10 NOTE — Progress Notes (Signed)
Nursing Pain Medication Assessment:  Safety precautions to be maintained throughout the outpatient stay will include: orient to surroundings, keep bed in low position, maintain call bell within reach at all times, provide assistance with transfer out of bed and ambulation.  Medication Inspection Compliance: Pill count conducted under aseptic conditions, in front of the patient. Neither the pills nor the bottle was removed from the patient's sight at any time. Once count was completed pills were immediately returned to the patient in their original bottle.  Medication: Oxycodone IR Pill/Patch Count: 0 of 120 pills remain Pill/Patch Appearance: Markings consistent with prescribed medication Bottle Appearance: Standard pharmacy container. Clearly labeled. Filled Date:01 / 15 / 2018 Last Medication intake:  Ran out of medicine more than 48 hours ago

## 2017-07-10 NOTE — Progress Notes (Signed)
Patient's Name: Jennifer Rivers  MRN: [id number]  Referring Provider: Kirk Ruths, MD  DOB: 11-14-1945  PCP: Kirk Ruths, MD  DOS: 07/10/2017  Note by: Vevelyn Francois NP  Service setting: Ambulatory outpatient  Specialty: Interventional Pain Management  Location: [facility] (AMB) Pain Management Facility    Patient type: Established    Primary Reason(s) for Visit: Encounter for prescription drug management & post-procedure evaluation of chronic illness with mild to moderate exacerbation(Level of risk: moderate) CC: Back Pain (low)  HPI  Jennifer Rivers is a 72 y.o. year old, female patient, who comes today for a post-procedure evaluation and medication management. She has Adult idiopathic generalized osteoporosis; Atypical migraine; COPD (chronic obstructive pulmonary disease) ([place]); Generalized OA; HTN, goal below 140/90; Current tear of meniscus; Depression, major, in remission ([place]); Chronic knee pain (Primary Source of Pain) (Bilateral) (R>L); Long term current use of opiate analgesic; Long term prescription opiate use; Opiate use (20 MME/Day); Encounter for pain management planning; Encounter for therapeutic drug level monitoring; Chronic low back pain (Secondary source of pain) (Bilateral) (L>R); Tricompartmental knee arthropathy (Bilateral); Chronic upper back pain (between shoulder blades) (Bilateral) (R>L); Chronic shoulder pain [facility] source of pain) (Bilateral) (L>R); 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); Chronic lower extremity pain (Bilateral) (R>L); Chronic lower extremity radicular pain (Bilateral) (R>L); MRSA (methicillin resistant staph aureus) culture positive; Radicular pain of shoulder; Chronic cervical radicular pain (C4/C5 dermatomes) (L); Elevated sed rate; Elevated C-reactive protein (CRP); Chronic pain syndrome; Osteoarthritis of knees (Bilateral); Tear of meniscus of knee; Hip pain, chronic, unspecified laterality (B) (R>L); Primary osteoarthritis of hips,  bilateral; Spondylosis of lumbar spine; Lumbosacral facet joint syndrome ([place]); Chronic Neurogenic pain; Hyponatremia; Health care maintenance; 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 Her primarily concern today is the Back Pain (low)  Pain Assessment: Location: Lower Back Radiating: radiates down left leg to foot in the front Onset: More than a month ago Duration: Chronic pain Quality: Aching, Nagging, Dull, Constant Severity: 5 /10 (self-reported pain score)  Note: Reported level is compatible with observation.                          Effect on ADL:  does daily household chores Timing: Constant Modifying factors: heat, rest  Ms. Scalzo was last seen on 07/06/2017 for a procedure. During today's appointment we reviewed Jennifer Rivers's post-procedure results, as well as her outpatient medication regimen. She is in today for medication refill. She is in today with her daughter secondary to running out of medication. Her pharmacy [facility] closed and the patients were to [facility]. The daughter states that she did not like [facility] and they stated that they did not get a hard copy of the prescription. She later said that she heard the pharamist say that "the hard copies were all shredded." The patient has been out of medication this weekend. She has experienced some withdrawal symptoms of tremors and irritably.     Further details on both, my assessment(s), as well as the proposed treatment plan, please see below.  Controlled Substance Pharmacotherapy Assessment REMS (Risk Evaluation and Mitigation Strategy)  Analgesic:Oxycodone IR 5 mg 1 tablet by mouth every 6 hours (20 mg/dayof oxycodone) MME/day:75m/day. TDewayne Shorter RN  07/10/2017  2:11 PM  Signed Nursing Pain Medication Assessment:  Safety precautions to be maintained throughout the outpatient stay will include: orient to surroundings, keep bed in low position, maintain call  bell  within reach at all times, provide assistance with transfer out of bed and ambulation.  Medication Inspection Compliance: Pill count conducted under aseptic conditions, in front of the patient. Neither the pills nor the bottle was removed from the patient's sight at any time. Once count was completed pills were immediately returned to the patient in their original bottle.  Medication: Oxycodone IR Pill/Patch Count: 0 of 120 pills remain Pill/Patch Appearance: Markings consistent with prescribed medication Bottle Appearance: Standard pharmacy container. Clearly labeled. Filled Date:01 / 15 / 2018 Last Medication intake:  Ran out of medicine more than 48 hours ago   Pharmacokinetics: Liberation and absorption (onset of action): WNL Distribution (time to peak effect): WNL Metabolism and excretion (duration of action): WNL         Pharmacodynamics: Desired effects: Analgesia: Jennifer Rivers reports >50% benefit. Functional ability: Patient reports that medication allows her to accomplish basic ADLs Clinically meaningful improvement in function (CMIF): Sustained CMIF goals met Perceived effectiveness: Described as relatively effective, allowing for increase in activities of daily living (ADL) Undesirable effects: Side-effects or Adverse reactions: None reported Monitoring: [place] PMP: Online review of the past 4-monthperiod conducted. Compliant with practice rules and regulations Last UDS on record: Summary  Date Value Ref Range Status  10/05/2016 FINAL  Final    Comment:    ==================================================================== TOXASSURE SELECT 13 (MW) ==================================================================== Test                             Result       Flag       Units Drug Present and Declared for Prescription Verification   Oxycodone                      435          EXPECTED   ng/mg creat   Oxymorphone                    165          EXPECTED   ng/mg  creat   Noroxycodone                   520          EXPECTED   ng/mg creat    Sources of oxycodone include scheduled prescription medications.    Oxymorphone and noroxycodone are expected metabolites of    oxycodone. Oxymorphone is also available as a scheduled    prescription medication. ==================================================================== Test                      Result    Flag   Units      Ref Range   Creatinine              51               mg/dL      >=20 ==================================================================== Declared Medications:  The flagging and interpretation on this report are based on the  following declared medications.  Unexpected results may arise from  inaccuracies in the declared medications.  **Note: The testing scope of this panel includes these medications:  Oxycodone  **Note: The testing scope of this panel does not include following  reported medications:  Albuterol  Carvedilol  Cholecalciferol  Cyanocobalamin  Cyclobenzaprine  Omeprazole ==================================================================== For clinical consultation, please call [phone] ====================================================================    UDS interpretation: Compliant  Medication Assessment Form: Reviewed. Patient indicates being compliant with therapy Treatment compliance: Compliant Risk Assessment Profile: Aberrant behavior: See prior evaluations. None observed or detected today Comorbid factors increasing risk of overdose: See prior notes. No additional risks detected today Risk of substance use disorder (SUD): Low Opioid Risk Tool - 05/31/17 1331      Family History of Substance Abuse   Alcohol  Negative    Illegal Drugs  Negative    Rx Drugs  Negative      Personal History of Substance Abuse   Alcohol  Negative    Illegal Drugs  Negative    Rx Drugs  Negative      Age   Age between 31-45 years   No       History of Preadolescent Sexual Abuse   History of Preadolescent Sexual Abuse  Negative or Female      Psychological Disease   Psychological Disease  Negative    Depression  Negative      Total Score   Opioid Risk Tool Scoring  0    Opioid Risk Interpretation  Low Risk      ORT Scoring interpretation table:  Score <3 = Low Risk for SUD  Score between 4-7 = Moderate Risk for SUD  Score >8 = High Risk for Opioid Abuse   Risk Mitigation Strategies:  Patient Counseling: Covered Patient-Prescriber Agreement (PPA): Present and active  Notification to other healthcare providers: Done  Pharmacologic Plan: No change in therapy, at this time.             Post-Procedure Assessment  05/31/2017 Procedure: Right-sided transforaminal and lumbar epidural steroid injection Pre-procedure pain score:  5/10 Post-procedure pain score: 0/10         Influential Factors: BMI: 29.26 kg/m Intra-procedural challenges: None observed.         Assessment challenges: None detected.              Reported side-effects: None.        Post-procedural adverse reactions or complications: None reported         Sedation: Please see nurses note. When no sedatives are used, the analgesic levels obtained are directly associated to the effectiveness of the local anesthetics. However, when sedation is provided, the level of analgesia obtained during the initial 1 hour following the intervention, is believed to be the result of a combination of factors. These factors may include, but are not limited to: 1. The effectiveness of the local anesthetics used. 2. The effects of the analgesic(s) and/or anxiolytic(s) used. 3. The degree of discomfort experienced by the patient at the time of the procedure. 4. The patients ability and reliability in recalling and recording the events. 5. The presence and influence of possible secondary gains and/or psychosocial factors. Reported result: Relief experienced during the 1st hour after  the procedure: 100 % (Ultra-Short Term Relief)            Interpretative annotation: Clinically appropriate result. Analgesia during this period is likely to be Local Anesthetic and/or IV Sedative (Analgesic/Anxiolytic) related.          Effects of local anesthetic: The analgesic effects attained during this period are directly associated to the localized infiltration of local anesthetics and therefore cary significant diagnostic value as to the etiological location, or anatomical origin, of the pain. Expected duration of relief is directly dependent on the pharmacodynamics of the local anesthetic used. Long-acting (4-6 hours) anesthetics used.  Reported result: Relief during the next 4 to  6 hour after the procedure: 100 % (Short-Term Relief)            Interpretative annotation: Clinically appropriate result. Analgesia during this period is likely to be Local Anesthetic-related.          Long-term benefit: Defined as the period of time past the expected duration of local anesthetics (1 hour for short-acting and 4-6 hours for long-acting). With the possible exception of prolonged sympathetic blockade from the local anesthetics, benefits during this period are typically attributed to, or associated with, other factors such as analgesic sensory neuropraxia, antiinflammatory effects, or beneficial biochemical changes provided by agents other than the local anesthetics.  Reported result: Extended relief following procedure: 40 % (Long-Term Relief)            Interpretative annotation: Clinically appropriate result. Good relief. No permanent benefit expected. Inflammation plays a part in the etiology to the pain.          Current benefits: Defined as reported results that persistent at this point in time.   Analgesia: <50 %            Function: Back to baseline ROM: Back to baseline Interpretative annotation: Recurrence of symptoms. No permanent benefit expected. Effective diagnostic intervention.           Interpretation: Results would suggest a successful diagnostic intervention.                  Plan:  Please see "Plan of Care" for details.                 Laboratory Chemistry  Inflammation Markers (CRP: Acute Phase) (ESR: Chronic Phase) Lab Results  Component Value Date   CRP 3.0 (H) 12/28/2015   ESRSEDRATE 55 (H) 12/28/2015                         Rheumatology Markers Lab Results  Component Value Date   RF 14.6 (H) 02/03/2016                Renal Function Markers Lab Results  Component Value Date   BUN 15 04/18/2017   CREATININE 0.79 04/18/2017   GFRAA >60 04/18/2017   GFRNONAA >60 04/18/2017                 Hepatic Function Markers Lab Results  Component Value Date   AST 24 12/28/2015   ALT 17 12/28/2015   ALBUMIN 4.2 12/28/2015   ALKPHOS 115 12/28/2015   LIPASE 33 03/10/2015                 Electrolytes Lab Results  Component Value Date   NA 129 (L) 04/18/2017   K 3.8 04/18/2017   CL 94 (L) 04/18/2017   CALCIUM 8.8 (L) 04/18/2017   MG 1.6 (L) 04/17/2017                        Neuropathy Markers Lab Results  Component Value Date   VITAMINB12 246 04/16/2017   FOLATE 8.7 04/16/2017   HGBA1C 5.6 03/11/2015                 Bone Pathology Markers Lab Results  Component Value Date   25OHVITD1 24 (L) 12/28/2015   25OHVITD2 2.5 12/28/2015   25OHVITD3 21 12/28/2015                         Coagulation Parameters Lab  Results  Component Value Date   PLT 248 04/17/2017                 Cardiovascular Markers Lab Results  Component Value Date   BNP 188.0 (H) 04/16/2017   TROPONINI <0.03 04/16/2017   HGB 10.2 (L) 04/17/2017   HCT 29.9 (L) 04/17/2017                 CA Markers No results found for: CEA, CA125, LABCA2               Note: Lab results reviewed.  Recent Diagnostic Imaging Results  DG C-Arm 1-60 Min-No Report Fluoroscopy was utilized by the requesting physician.  No radiographic  interpretation.   Complexity Note: Imaging  results reviewed. Results shared with Jennifer Rivers, using Layman's terms.                         Meds   Current Outpatient Medications:  .  albuterol (VENTOLIN HFA) 108 (90 BASE) MCG/ACT inhaler, Take 2 puffs by mouth every 6 (six) hours as needed., Disp: , Rfl:  .  benzonatate (TESSALON) 100 MG capsule, Take 1 capsule (100 mg total) by mouth 3 (three) times daily., Disp: 20 capsule, Rfl: 0 .  furosemide (LASIX) 20 MG tablet, , Disp: , Rfl:  .  losartan (COZAAR) 100 MG tablet, , Disp: , Rfl:  .  omeprazole (PRILOSEC) 20 MG capsule, Take 20 mg by mouth daily. , Disp: , Rfl:  .  [START ON 09/08/2017] oxyCODONE (OXY IR/ROXICODONE) 5 MG immediate release tablet, Take 1 tablet (5 mg total) by mouth every 6 (six) hours as needed for severe pain., Disp: 120 tablet, Rfl: 0 .  pregabalin (LYRICA) 50 MG capsule, Take 1 capsule (50 mg total) by mouth 3 (three) times daily., Disp: 90 capsule, Rfl: 2 .  tiZANidine (ZANAFLEX) 4 MG tablet, Take 1 tablet (4 mg total) by mouth every 8 (eight) hours as needed for muscle spasms., Disp: 90 tablet, Rfl: 0 .  [START ON 08/09/2017] oxyCODONE (OXY IR/ROXICODONE) 5 MG immediate release tablet, Take 1 tablet (5 mg total) by mouth every 6 (six) hours as needed for severe pain., Disp: 120 tablet, Rfl: 0 .  oxyCODONE (OXY IR/ROXICODONE) 5 MG immediate release tablet, Take 1 tablet (5 mg total) by mouth every 6 (six) hours as needed for severe pain., Disp: 120 tablet, Rfl: 0  ROS  Constitutional: Denies any fever or chills Gastrointestinal: No reported hemesis, hematochezia, vomiting, or acute GI distress Musculoskeletal: Denies any acute onset joint swelling, redness, loss of ROM, or weakness Neurological: No reported episodes of acute onset apraxia, aphasia, dysarthria, agnosia, amnesia, paralysis, loss of coordination, or loss of consciousness  Allergies  Jennifer Rivers is allergic to bupropion; codeine; gabapentin; tramadol; penicillins; and sulfa antibiotics.  [place]   Drug: Jennifer Rivers  reports that she does not use drugs. Alcohol:  reports that she does not drink alcohol. Tobacco:  reports that she has been smoking cigarettes.  She has been smoking about 0.50 packs per day. she has never used smokeless tobacco. Medical:  has a past medical history of Asthma, GERD (gastroesophageal reflux disease), Hypertension, MRSA (methicillin resistant staph aureus) culture positive (08/2015), Multilevel degenerative disc disease, Osteoarthritis, and Osteoporosis. Surgical: Jennifer Rivers  has a past surgical history that includes Abdominal hysterectomy; Tonsillectomy; and Knee arthroscopy (Right, 09/15/2015). Family: family history includes Diabetes in her mother; Heart attack in her father.  Constitutional Exam  General appearance:  Well nourished, well developed, and well hydrated. In no apparent acute distress Vitals:   07/10/17 1400  BP: (!) 127/93  Pulse: (!) 113  Resp: 16  Temp: 98.2 F (36.8 C)  SpO2: 99%  Weight: 160 lb (72.6 kg)  Height: 5' 2"  (1.575 m)  Psych/Mental status: Alert, oriented x 3 (person, place, & time)       Eyes: PERLA Respiratory: No evidence of acute respiratory distress  Cervical Spine Area Exam  Skin & Axial Inspection: No masses, redness, edema, swelling, or associated skin lesions Alignment: Symmetrical Functional ROM: Unrestricted ROM      Stability: No instability detected Muscle Tone/Strength: Functionally intact. No obvious neuro-muscular anomalies detected. Sensory (Neurological): Unimpaired Palpation: No palpable anomalies              Upper Extremity (UE) Exam    Side: Right upper extremity  Side: Left upper extremity  Skin & Extremity Inspection: Skin color, temperature, and hair growth are WNL. No peripheral edema or cyanosis. No masses, redness, swelling, asymmetry, or associated skin lesions. No contractures.  Skin & Extremity Inspection: Skin color, temperature, and hair growth are WNL. No peripheral edema or cyanosis.  No masses, redness, swelling, asymmetry, or associated skin lesions. No contractures.  Functional ROM: Unrestricted ROM          Functional ROM: Unrestricted ROM          Muscle Tone/Strength: Functionally intact. No obvious neuro-muscular anomalies detected.  Muscle Tone/Strength: Functionally intact. No obvious neuro-muscular anomalies detected.  Sensory (Neurological): Unimpaired          Sensory (Neurological): Unimpaired          Palpation: No palpable anomalies              Palpation: No palpable anomalies              Specialized Test(s): Deferred         Specialized Test(s): Deferred          Thoracic Spine Area Exam  Skin & Axial Inspection: No masses, redness, or swelling Alignment: Symmetrical Functional ROM: Unrestricted ROM Stability: No instability detected Muscle Tone/Strength: Functionally intact. No obvious neuro-muscular anomalies detected. Sensory (Neurological): Unimpaired Muscle strength & Tone: No palpable anomalies  Lumbar Spine Area Exam  Skin & Axial Inspection: No masses, redness, or swelling Alignment: Symmetrical Functional ROM: Unrestricted ROM      Stability: No instability detected Muscle Tone/Strength: Functionally intact. No obvious neuro-muscular anomalies detected. Sensory (Neurological): Unimpaired Palpation: No palpable anomalies       Provocative Tests: Lumbar Hyperextension and rotation test: evaluation deferred today       Lumbar Lateral bending test: evaluation deferred today       Patrick's Maneuver: evaluation deferred today                    Gait & Posture Assessment  Ambulation: Unassisted Gait: Relatively normal for age and body habitus Posture: WNL   Lower Extremity Exam    Side: Right lower extremity  Side: Left lower extremity  Skin & Extremity Inspection: Skin color, temperature, and hair growth are WNL. No peripheral edema or cyanosis. No masses, redness, swelling, asymmetry, or associated skin lesions. No contractures.  Skin &  Extremity Inspection: Skin color, temperature, and hair growth are WNL. No peripheral edema or cyanosis. No masses, redness, swelling, asymmetry, or associated skin lesions. No contractures.  Functional ROM: Unrestricted ROM          Functional ROM: Unrestricted ROM  Muscle Tone/Strength: Functionally intact. No obvious neuro-muscular anomalies detected.  Muscle Tone/Strength: Functionally intact. No obvious neuro-muscular anomalies detected.  Sensory (Neurological): Unimpaired  Sensory (Neurological): Unimpaired  Palpation: No palpable anomalies  Palpation: No palpable anomalies   Assessment  Primary Diagnosis & Pertinent Problem List: Diagnoses of Chronic low back pain (Secondary source of pain) (Bilateral) (L>R) and Chronic pain syndrome were pertinent to this visit.  Status Diagnosis  Controlled Controlled Controlled 1. Chronic low back pain (Secondary source of pain) (Bilateral) (L>R)   2. Chronic pain syndrome     Problems updated and reviewed during this visit: No problems updated. Plan of Care  Pharmacotherapy (Medications Ordered): Meds ordered this encounter  Medications  . oxyCODONE (OXY IR/ROXICODONE) 5 MG immediate release tablet    Sig: Take 1 tablet (5 mg total) by mouth every 6 (six) hours as needed for severe pain.    Dispense:  120 tablet    Refill:  0    Do not place this medication, or any other prescription from our practice, on "Automatic Refill". Patient may have prescription filled one day early if pharmacy is closed on scheduled refill date. Do not fill until:09/08/2017 To last until: 10/08/2017    Order Specific Question:   Supervising Provider    Answer:   Milinda Pointer [phone]  . pregabalin (LYRICA) 50 MG capsule    Sig: Take 1 capsule (50 mg total) by mouth 3 (three) times daily.    Dispense:  90 capsule    Refill:  2    Do not place this medication, or any other prescription from our practice, on "Automatic Refill". Patient may have  prescription filled one day early if pharmacy is closed on scheduled refill date.    Order Specific Question:   Supervising Provider    Answer:   Milinda Pointer [phone]  . oxyCODONE (OXY IR/ROXICODONE) 5 MG immediate release tablet    Sig: Take 1 tablet (5 mg total) by mouth every 6 (six) hours as needed for severe pain.    Dispense:  120 tablet    Refill:  0    Do not place this medication, or any other prescription from our practice, on "Automatic Refill". Patient may have prescription filled one day early if pharmacy is closed on scheduled refill date. Do not fill until: 08/09/2017 To last until: 09/08/2017    Order Specific Question:   Supervising Provider    Answer:   Milinda Pointer [phone]  . oxyCODONE (OXY IR/ROXICODONE) 5 MG immediate release tablet    Sig: Take 1 tablet (5 mg total) by mouth every 6 (six) hours as needed for severe pain.    Dispense:  120 tablet    Refill:  0    Do not place this medication, or any other prescription from our practice, on "Automatic Refill". Patient may have prescription filled one day early if pharmacy is closed on scheduled refill date. Do not fill until: 07/10/2017 To last until: 08/09/2017    Order Specific Question:   Supervising Provider    Answer:   Milinda Pointer [phone]   New Prescriptions   No medications on file   Medications administered today: Jennifer Rivers had no medications administered during this visit. Lab-work, procedure(s), and/or referral(s): No orders of the defined types were placed in this encounter.  Imaging and/or referral(s): None  Interventional therapies: Planned, scheduled, and/or pending:  Not at this time. PMP was verified that [facility] was her previous pharmacy and that no refills has been made since 06/04/17.  The patient was informed that this is not the policy to rewrite prescriptions once given because it is their responsibility to maintain this prescription. However since this was slightly  out of the control of the patient and exception will be made this time only.    Considering:  Left cervical epidural steroid injection #2 Diagnostic bilateral intra-articular knee injection Possible series of 5 bilateral [place] intra-articular knee injections Diagnostic bilateral genicular nerve blocks  Possible bilateral genicular nerve radiofrequency ablation Diagnostic bilateral lumbar facet block  Possible bilateral lumbar facet radiofrequency ablation  Diagnostic bilateral intra-articular shoulder joint injection  Diagnostic bilateral suprascapular nerve block  Possible bilateral suprascapular nerve radiofrequency ablation  Diagnostic right-sided L5-S1 transforaminal epidural steroid injection Diagnostic left sided S1 transforaminal epidural steroid injection Diagnostic right-sided L4-5 lumbar epidural steroid injection    Palliative PRN treatment(s):  Palliative bilateral intra-articular knee injection with local anesthetic and steroid. Left cervical epidural steroid injection #2      Provider-requested follow-up: Return in about 3 months (around 10/01/2017) for MedMgmt with Me Jennifer Rivers).  No future appointments. Primary Care Physician: Kirk Ruths, MD Location: [facility] Note by: Vevelyn Francois NP Date: 07/10/2017; Time: 2:49 PM  Pain Score Disclaimer: We use the NRS-11 scale. This is a self-reported, subjective measurement of pain severity with only modest accuracy. It is used primarily to identify changes within a particular patient. It must be understood that outpatient pain scales are significantly less accurate that those used for research, where they can be applied under ideal controlled circumstances with minimal exposure to variables. In reality, the score is likely to be a combination of pain intensity and pain affect, where pain affect describes the degree of emotional arousal or changes in action readiness caused by  the sensory experience of pain. Factors such as social and work situation, setting, emotional state, anxiety levels, expectation, and prior pain experience may influence pain perception and show large inter-individual differences that may also be affected by time variables.  Patient instructions provided during this appointment: Patient Instructions   ____________________________________________________________________________________________  Medication Rules  Applies to: All patients receiving prescriptions (written or electronic).  Pharmacy of record: Pharmacy where electronic prescriptions will be sent. If written prescriptions are taken to a different pharmacy, please inform the nursing staff. The pharmacy listed in the electronic medical record should be the one where you would like electronic prescriptions to be sent.  Prescription refills: Only during scheduled appointments. Applies to both, written and electronic prescriptions.  NOTE: The following applies primarily to controlled substances (Opioid* Pain Medications).   Patient's responsibilities: 1. Pain Pills: Bring all pain pills to every appointment (except for procedure appointments). 2. Pill Bottles: Bring pills in original pharmacy bottle. Always bring newest bottle. Bring bottle, even if empty. 3. Medication refills: You are responsible for knowing and keeping track of what medications you need refilled. The day before your appointment, write a list of all prescriptions that need to be refilled. Bring that list to your appointment and give it to the admitting nurse. Prescriptions will be written only during appointments. If you forget a medication, it will not be "Called in", "Faxed", or "electronically sent". You will need to get another appointment to get these prescribed. 4. Prescription Accuracy: You are responsible for carefully inspecting your prescriptions before leaving our office. Have the discharge nurse carefully go  over each prescription with you, before taking them home. Make sure that your name is accurately spelled, that your address is correct.  Check the name and dose of your medication to make sure it is accurate. Check the number of pills, and the written instructions to make sure they are clear and accurate. Make sure that you are given enough medication to last until your next medication refill appointment. 5. Taking Medication: Take medication as prescribed. Never take more pills than instructed. Never take medication more frequently than prescribed. Taking less pills or less frequently is permitted and encouraged, when it comes to controlled substances (written prescriptions).  6. Inform other Doctors: Always inform, all of your healthcare providers, of all the medications you take. 7. Pain Medication from other Providers: You are not allowed to accept any additional pain medication from any other Doctor or Healthcare provider. There are two exceptions to this rule. (see below) In the event that you require additional pain medication, you are responsible for notifying us, as stated below. 8. Medication Agreement: You are responsible for carefully reading and following our Medication Agreement. This must be signed before receiving any prescriptions from our practice. Safely store a copy of your signed Agreement. Violations to the Agreement will result in no further prescriptions. (Additional copies of our Medication Agreement are available upon request.) 9. Laws, Rules, & Regulations: All patients are expected to follow all Federal and [employer], Rules, [employer]. Ignorance of the Laws does not constitute a valid excuse. The use of any illegal substances is prohibited. 10. Adopted CDC guidelines & recommendations: Target dosing levels will be at or below 60 MME/day. Use of benzodiazepines** is not recommended.  Exceptions: There are only two exceptions to the rule of not receiving pain  medications from other Healthcare Providers. 1. Exception #1 (Emergencies): In the event of an emergency (i.e.: accident requiring emergency care), you are allowed to receive additional pain medication. However, you are responsible for: As soon as you are able, call our office [phone], at any time of the day or night, and leave a message stating your name, the date and nature of the emergency, and the name and dose of the medication prescribed. In the event that your call is answered by a member of our staff, make sure to document and save the date, time, and the name of the person that took your information.  2. Exception #2 (Planned Surgery): In the event that you are scheduled by another doctor or dentist to have any type of surgery or procedure, you are allowed (for a period no longer than 30 days), to receive additional pain medication, for the acute post-op pain. However, in this case, you are responsible for picking up a copy of our "Post-op Pain Management for Surgeons" handout, and giving it to your surgeon or dentist. This document is available at our office, and does not require an appointment to obtain it. Simply go to our office during business hours (Monday-Thursday from 8:00 AM to 4:00 PM) (Friday 8:00 AM to 12:00 Noon) or if you have a scheduled appointment with [place], prior to your surgery, and ask for it by name. In addition, you will need to provide [place] with your name, name of your surgeon, type of surgery, and date of procedure or surgery.  *Opioid medications include: morphine, codeine, oxycodone, oxymorphone, hydrocodone, hydromorphone, meperidine, tramadol, tapentadol, buprenorphine, fentanyl, methadone. **Benzodiazepine medications include: diazepam (Valium), alprazolam (Xanax), clonazepam (Klonopine), lorazepam (Ativan), clorazepate (Tranxene), chlordiazepoxide (Librium), estazolam (Prosom), oxazepam (Serax), temazepam (Restoril), triazolam  (Halcion)  ____________________________________________________________________________________________

## 2017-07-10 NOTE — Patient Instructions (Signed)

## 2017-09-15 ENCOUNTER — Encounter: Payer: Self-pay | Admitting: Gynecology

## 2017-09-15 ENCOUNTER — Ambulatory Visit: Payer: Medicare HMO

## 2017-09-15 ENCOUNTER — Other Ambulatory Visit: Payer: Self-pay

## 2017-09-15 ENCOUNTER — Ambulatory Visit
Admission: EM | Admit: 2017-09-15 | Discharge: 2017-09-15 | Disposition: A | Discharge status: Home / Self Care | Payer: Medicare HMO | Attending: Family Medicine | Admitting: Family Medicine

## 2017-09-15 DIAGNOSIS — J029 Acute pharyngitis, unspecified: Secondary | ICD-10-CM

## 2017-09-15 DIAGNOSIS — R0602 Shortness of breath: Secondary | ICD-10-CM

## 2017-09-15 DIAGNOSIS — R062 Wheezing: Secondary | ICD-10-CM

## 2017-09-15 LAB — RAPID STREP SCREEN (MED CTR MEBANE ONLY): Streptococcus, Group A Screen (Direct): NEGATIVE

## 2017-09-15 MED ORDER — METHYLPREDNISOLONE SODIUM SUCC 125 MG IJ SOLR
125.0000 mg | Freq: Once | INTRAMUSCULAR | Status: AC
  Administered 2017-09-15: 125 mg via INTRAMUSCULAR

## 2017-09-15 MED ORDER — IPRATROPIUM-ALBUTEROL 0.5-2.5 (3) MG/3ML IN SOLN
3.0000 mL | Freq: Four times a day (QID) | RESPIRATORY_TRACT | Status: DC
  Administered 2017-09-15 (×2): 3 mL via RESPIRATORY_TRACT

## 2017-09-15 MED ORDER — PREDNISONE 20 MG PO TABS: 40.0000 mg | ORAL_TABLET | Freq: Every day | ORAL | 0 refills | Status: DC

## 2017-09-15 NOTE — ED Triage Notes (Signed)
Patient c/o x 4 days painful to swallow.

## 2017-09-15 NOTE — Discharge Instructions (Addendum)
Please take prednisone as prescribed.  Continue with albuterol inhaler as needed.  If any shortness of breath or wheezing return to the ED.  Return to ED for any fevers, worsening symptoms or to changes in health.

## 2017-09-15 NOTE — ED Provider Notes (Signed)
[facility]    CSN: [id number] Arrival date & time: 09/15/17  1231     History   Chief Complaint Chief Complaint  Patient presents with  . Sore Throat    HPI Jennifer Rivers is a 72 y.o. female.   Presents to the emergency department for evaluation of shortness of breath, wheezing and sore throat.  Symptoms have been present for 4 days.  She denies any productive cough, chest pain.  No lower extremity swelling.  Patient has been using albuterol for her wheezing shortness of breath which gives her mild improvement.  She does not use any nebulizer treatments.  She is also had sore throat for 4 days, tolerating p.o. well.  Does not take any medications for sore throat.  She denies any fevers, difficulty swallowing.  HPI  Past Medical History:  Diagnosis Date  . Asthma   . GERD (gastroesophageal reflux disease)   . Hypertension   . MRSA (methicillin resistant staph aureus) culture positive 08/2015  . Multilevel degenerative disc disease   . Osteoarthritis   . Osteoporosis     Patient Active Problem List   Diagnosis Date Noted  . Lumbar foraminal stenosis (L4-5) (Right) 05/30/2017  . 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 Hyponatremia 04/16/2017  . Health care maintenance 03/14/2017  . Chronic Neurogenic pain 01/08/2017  . Hip pain, chronic, unspecified laterality (B) (R>L) 07/17/2016  . Primary osteoarthritis of hips, bilateral 07/17/2016  . 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 Lumbosacral facet joint syndrome (HCC) 07/17/2016  . Chronic pain syndrome 04/06/2016  . Osteoarthritis of knees (Bilateral) 04/06/2016  . Elevated sed rate 02/03/2016  . Elevated C-reactive protein (CRP) 02/03/2016  . Chronic knee pain (Primary Source of Pain) (Bilateral) (R>L) 12/28/2015  . Long term current use of opiate analgesic 12/28/2015  . Long term prescription opiate use 12/28/2015  . Opiate use (20 MME/Day) 12/28/2015  . Encounter for pain management  planning 12/28/2015  . Encounter for therapeutic drug level monitoring 12/28/2015  . Chronic low back pain (Secondary source of pain) (Bilateral) (L>R) 12/28/2015  . Tricompartmental knee arthropathy (Bilateral) 12/28/2015  . Chronic upper back pain (between shoulder blades) (Bilateral) (R>L) 12/28/2015  . Chronic shoulder pain [facility] source of pain) (Bilateral) (L>R) 12/28/2015  . 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 Chronic lower extremity pain (Bilateral) (R>L) 12/28/2015  . Chronic lower extremity radicular pain (Bilateral) (R>L) 12/28/2015  . MRSA (methicillin resistant staph aureus) culture positive 12/28/2015  . Radicular pain of shoulder 12/28/2015  . Chronic cervical radicular pain (C4/C5 dermatomes) (L) 12/28/2015  . Current tear of meniscus 08/23/2015  . Tear of meniscus of knee 08/23/2015  . 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 Atypical migraine 07/14/2015  . COPD (chronic obstructive pulmonary disease) (HCC) 07/14/2015  . Generalized OA 07/14/2015  . HTN, goal below 140/90 07/14/2015  . Depression, major, in remission (HCC) 07/14/2015    Past Surgical History:  Procedure Laterality Date  . ABDOMINAL HYSTERECTOMY    . KNEE ARTHROSCOPY Right 09/15/2015   Procedure: ARTHROSCOPY KNEE partial medial and lateral menisectomy, chondroplasty;  Surgeon: Erin Sons, MD;  Location: [facility];  Service: Orthopedics;  Laterality: Right;  . TONSILLECTOMY      OB History   None      Home Medications    Prior to Admission medications   Medication Sig Start Date End Date Taking? Authorizing Provider  albuterol (VENTOLIN HFA) 108 (90 BASE) MCG/ACT inhaler Take 2 puffs by mouth every 6 (  six) hours as needed. 01/09/15  Yes [provider]  furosemide (LASIX) 20 MG tablet  05/23/17  Yes [provider]  losartan (COZAAR) 100 MG tablet  05/23/17  Yes [provider]  omeprazole (PRILOSEC) 20 MG capsule Take 20 mg by  mouth daily.    Yes [provider]  oxyCODONE (OXY IR/ROXICODONE) 5 MG immediate release tablet Take 1 tablet (5 mg total) by mouth every 6 (six) hours as needed for severe pain. 09/08/17 10/08/17 Yes King, Shana Chute, NP  pregabalin (LYRICA) 50 MG capsule Take 1 capsule (50 mg total) by mouth 3 (three) times daily. 07/10/17 10/08/17 Yes King, Shana Chute, NP  benzonatate (TESSALON) 100 MG capsule Take 1 capsule (100 mg total) by mouth 3 (three) times daily. 04/18/17   Adrian Saran, MD  oxyCODONE (OXY IR/ROXICODONE) 5 MG immediate release tablet Take 1 tablet (5 mg total) by mouth every 6 (six) hours as needed for severe pain. 08/09/17 09/08/17  Barbette Merino, NP  oxyCODONE (OXY IR/ROXICODONE) 5 MG immediate release tablet Take 1 tablet (5 mg total) by mouth every 6 (six) hours as needed for severe pain. 07/10/17 08/09/17  Barbette Merino, NP  predniSONE (DELTASONE) 20 MG tablet Take 2 tablets (40 mg total) by mouth daily. 09/15/17   Evon Slack, PA-C    Family History Family History  Problem Relation Age of Onset  . Diabetes Mother   . Heart attack Father     Social History Social History   Tobacco Use  . Smoking status: Current Every Day Smoker    Packs/day: 0.50    Types: Cigarettes  . Smokeless tobacco: Never Used  Substance Use Topics  . Alcohol use: No    Alcohol/week: 0.0 oz  . Drug use: No     Allergies   Bupropion; Codeine; Gabapentin; Tramadol; Penicillins; and Sulfa antibiotics   Review of Systems Review of Systems  Constitutional: Negative for fever.  HENT: Positive for sore throat. Negative for trouble swallowing and voice change.   Respiratory: Positive for shortness of breath and wheezing.   Cardiovascular: Negative for chest pain.  Gastrointestinal: Negative for abdominal pain.  Genitourinary: Negative for difficulty urinating, dysuria and urgency.  Musculoskeletal: Negative for back pain and myalgias.  Skin: Negative for rash.  Neurological: Negative  for dizziness and headaches.     Physical Exam Triage Vital Signs ED Triage Vitals  Enc Vitals Group     BP 09/15/17 1244 (!) 165/97     Pulse Rate 09/15/17 1244 (!) 115     Resp 09/15/17 1244 16     Temp 09/15/17 1244 98.7 F (37.1 C)     Temp Source 09/15/17 1244 Oral     SpO2 09/15/17 1244 96 %     Weight 09/15/17 1246 155 lb (70.3 kg)     Height 09/15/17 1246  (1.575 m)     Head Circumference --      Peak Flow --      Pain Score --      Pain Loc --      Pain Edu? --      Excl. in GC? --    No data found.  Updated Vital Signs BP (!) 165/97 (BP Location: Left Arm)   Pulse (!) 115   Temp 98.7 F (37.1 C) (Oral)   Resp 16   Ht  (1.575 m)   Wt 155 lb (70.3 kg)   SpO2 96%   BMI 28.35 kg/m  Visual Acuity Right Eye Distance:   Left Eye Distance:   Bilateral Distance:    Right Eye Near:   Left Eye Near:    Bilateral Near:     Physical Exam  Constitutional: She is oriented to person, place, and time. She appears well-developed and well-nourished.  HENT:  Head: Normocephalic and atraumatic.  Mouth/Throat: Uvula is midline. No oral lesions. No uvula swelling. Posterior oropharyngeal erythema present. No oropharyngeal exudate, posterior oropharyngeal edema or tonsillar abscesses.  Eyes: Conjunctivae are normal.  Neck: Normal range of motion.  Cardiovascular: Normal rate.  Pulmonary/Chest: Effort normal. No respiratory distress. She has wheezes.  Abdominal: Soft. She exhibits no distension. There is no tenderness.  Musculoskeletal: Normal range of motion.  Negative Homans sign bilaterally, no lower extremity swelling or edema.  Neurological: She is alert and oriented to person, place, and time.  Skin: Skin is warm. No rash noted.  Psychiatric: She has a normal mood and affect. Her behavior is normal. Thought content normal.     UC Treatments / Results  Labs (all labs ordered are listed, but only abnormal results are displayed) Labs Reviewed  RAPID  STREP SCREEN ([facility] ONLY)  CULTURE, GROUP A STREP [facility])    EKG None Radiology Dg Chest 2 View  Result Date: 09/15/2017 CLINICAL DATA:  Asthma.  Wheezing and coughing. EXAM: CHEST - 2 VIEW COMPARISON:  April 16, 2017 FINDINGS: Scarring in the lateral right lung base. The heart, hila, mediastinum, lungs, and pleura otherwise demonstrate no acute abnormality or change. IMPRESSION: No active cardiopulmonary disease. Electronically Signed   By: Gerome Sam III M.D   On: 09/15/2017 13:26    Procedures Procedures (including critical care time)  Medications Ordered in UC Medications  ipratropium-albuterol (DUONEB) 0.5-2.5 (3) MG/3ML nebulizer solution 3 mL (3 mLs Nebulization Given 09/15/17 1314)  methylPREDNISolone sodium succinate (SOLU-MEDROL) 125 mg/2 mL injection 125 mg (125 mg Intramuscular Given 09/15/17 1310)     Initial Impression / Assessment and Plan / UC Course  I have reviewed the triage vital signs and the nursing notes.  Pertinent labs & imaging results that were available during my care of the patient were reviewed by me and considered in my medical decision making (see chart for details).     72 year old female with shortness of breath, sore throat and wheezing.  Rapid strep test negative.  No exudates noted.  She is afebrile.  No tender anterior cervical lymphadenopathy.  Cultures pending.  Chest x-ray negative.  Patient given 1 DuoNeb treatment which improved her wheezing tremendously.  She had good air movement bilaterally with no wheezing on exam post DuoNeb treatment.  Patient is given Solu-Medrol 125 mg IM.  She is sent home with prednisone 40 mg daily for 5 days.  She is educated on signs symptoms return to the clinic for.  Final Clinical Impressions(s) / UC Diagnoses   Final diagnoses:  Sore throat  Shortness of breath  Wheezing  Pharyngitis, unspecified etiology    ED Discharge Orders        Ordered    predniSONE (DELTASONE) 20 MG tablet  Daily       09/15/17 1337         Evon Slack, [place] 09/15/17 1342

## 2017-09-18 LAB — CULTURE, GROUP A STREP (THRC)

## 2017-10-04 ENCOUNTER — Other Ambulatory Visit: Payer: Self-pay

## 2017-10-04 ENCOUNTER — Encounter: Payer: Self-pay | Admitting: Nurse Practitioner

## 2017-10-04 ENCOUNTER — Other Ambulatory Visit: Payer: Self-pay | Admitting: Nurse Practitioner

## 2017-10-04 ENCOUNTER — Ambulatory Visit: Payer: Medicare HMO | Attending: Nurse Practitioner | Admitting: Nurse Practitioner

## 2017-10-04 VITALS — BP 180/100 | HR 96 | Temp 98.6°F | Resp 16 | Ht 62.0 in | Wt 155.0 lb

## 2017-10-04 DIAGNOSIS — F329 Major depressive disorder, single episode, unspecified: Secondary | ICD-10-CM | POA: Diagnosis not present

## 2017-10-04 DIAGNOSIS — M25562 Pain in left knee: Secondary | ICD-10-CM | POA: Insufficient documentation

## 2017-10-04 DIAGNOSIS — G894 Chronic pain syndrome: Secondary | ICD-10-CM

## 2017-10-04 DIAGNOSIS — S83209S Unspecified tear of unspecified meniscus, current injury, unspecified knee, sequela: Secondary | ICD-10-CM | POA: Insufficient documentation

## 2017-10-04 DIAGNOSIS — M545 Low back pain: Secondary | ICD-10-CM | POA: Insufficient documentation

## 2017-10-04 DIAGNOSIS — G8929 Other chronic pain: Secondary | ICD-10-CM

## 2017-10-04 DIAGNOSIS — X58XXXA Exposure to other specified factors, initial encounter: Secondary | ICD-10-CM | POA: Diagnosis not present

## 2017-10-04 DIAGNOSIS — M48061 Spinal stenosis, lumbar region without neurogenic claudication: Secondary | ICD-10-CM | POA: Diagnosis not present

## 2017-10-04 DIAGNOSIS — M19012 Primary osteoarthritis, left shoulder: Secondary | ICD-10-CM | POA: Insufficient documentation

## 2017-10-04 DIAGNOSIS — M1990 Unspecified osteoarthritis, unspecified site: Secondary | ICD-10-CM | POA: Insufficient documentation

## 2017-10-04 DIAGNOSIS — Z79899 Other long term (current) drug therapy: Secondary | ICD-10-CM | POA: Insufficient documentation

## 2017-10-04 DIAGNOSIS — M19011 Primary osteoarthritis, right shoulder: Secondary | ICD-10-CM | POA: Insufficient documentation

## 2017-10-04 DIAGNOSIS — J449 Chronic obstructive pulmonary disease, unspecified: Secondary | ICD-10-CM | POA: Insufficient documentation

## 2017-10-04 DIAGNOSIS — I10 Essential (primary) hypertension: Secondary | ICD-10-CM | POA: Insufficient documentation

## 2017-10-04 DIAGNOSIS — M810 Age-related osteoporosis without current pathological fracture: Secondary | ICD-10-CM | POA: Insufficient documentation

## 2017-10-04 DIAGNOSIS — G43909 Migraine, unspecified, not intractable, without status migrainosus: Secondary | ICD-10-CM | POA: Diagnosis not present

## 2017-10-04 DIAGNOSIS — M47816 Spondylosis without myelopathy or radiculopathy, lumbar region: Secondary | ICD-10-CM | POA: Diagnosis not present

## 2017-10-04 DIAGNOSIS — M5412 Radiculopathy, cervical region: Secondary | ICD-10-CM | POA: Insufficient documentation

## 2017-10-04 DIAGNOSIS — M25561 Pain in right knee: Secondary | ICD-10-CM

## 2017-10-04 DIAGNOSIS — Z8614 Personal history of Methicillin resistant Staphylococcus aureus infection: Secondary | ICD-10-CM | POA: Diagnosis not present

## 2017-10-04 DIAGNOSIS — Z79891 Long term (current) use of opiate analgesic: Secondary | ICD-10-CM | POA: Diagnosis not present

## 2017-10-04 MED ORDER — OXYCODONE HCL 5 MG PO TABS: 5.0000 mg | ORAL_TABLET | Freq: Four times a day (QID) | ORAL | 0 refills | Status: DC | PRN

## 2017-10-04 MED ORDER — PREGABALIN 50 MG PO CAPS: 50.0000 mg | ORAL_CAPSULE | Freq: Three times a day (TID) | ORAL | 2 refills | Status: DC

## 2017-10-04 NOTE — Progress Notes (Signed)
Patient's Name: Jennifer Rivers  MRN: [id number]  Referring Provider: Kirk Ruths, MD  DOB: December 28, 1945  PCP: Kirk Ruths, MD  DOS: 10/04/2017  Note by: Vevelyn Francois NP  Service setting: Ambulatory outpatient  Specialty: Interventional Pain Management  Location: [facility] (AMB) Pain Management Facility    Patient type: Established    Primary Reason(s) for Visit: Encounter for prescription drug management. (Level of risk: moderate)  CC: Knee Pain (right)  HPI  Jennifer Rivers is a 72 y.o. year old, female patient, who comes today for a medication management evaluation. She has Adult idiopathic generalized osteoporosis; Atypical migraine; COPD (chronic obstructive pulmonary disease) ([place]); Generalized OA; HTN, goal below 140/90; Current tear of meniscus; Depression, major, in remission ([place]); Chronic knee pain (Primary Source of Pain) (Bilateral) (R>L); Long term current use of opiate analgesic; Long term prescription opiate use; Opiate use (20 MME/Day); Encounter for pain management planning; Encounter for therapeutic drug level monitoring; Chronic low back pain (Secondary source of pain) (Bilateral) (L>R); Tricompartmental knee arthropathy (Bilateral); Chronic upper back pain (between shoulder blades) (Bilateral) (R>L); Chronic shoulder pain [facility] source of pain) (Bilateral) (L>R); 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); Chronic lower extremity pain (Bilateral) (R>L); Chronic lower extremity radicular pain (Bilateral) (R>L); MRSA (methicillin resistant staph aureus) culture positive; Radicular pain of shoulder; Chronic cervical radicular pain (C4/C5 dermatomes) (L); Elevated sed rate; Elevated C-reactive protein (CRP); Chronic pain syndrome; Osteoarthritis of knees (Bilateral); Tear of meniscus of knee; Hip pain, chronic, unspecified laterality (B) (R>L); Primary osteoarthritis of hips, bilateral; Spondylosis of lumbar spine; Lumbosacral facet joint syndrome ([place]); Chronic Neurogenic  pain; Hyponatremia; Health care maintenance; 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 Her primarily concern today is the Knee Pain (right)  Pain Assessment: Location: Right Knee Radiating: right lower leg to the foot Onset: More than a month ago Duration: Chronic pain Quality: Aching Severity: 8 /10 (subjective, self-reported pain score)  Note: Reported level is compatible with observation. Clinically the patient looks like a 3/10 A 3/10 is viewed as "Moderate" and described as significantly interfering with activities of daily living (ADL). It becomes difficult to feed, bathe, get dressed, get on and off the toilet or to perform personal hygiene functions. Difficult to get in and out of bed or a chair without assistance. Very distracting. With effort, it can be ignored when deeply involved in activities. Information on the proper use of the pain scale provided to the patient today. When using our objective Pain Scale, levels between 6 and 10/10 are said to belong in an emergency room, as it progressively worsens from a 6/10, described as severely limiting, requiring emergency care not usually available at an outpatient pain management facility. At a 6/10 level, communication becomes difficult and requires great effort. Assistance to reach the emergency department may be required. Facial flushing and profuse sweating along with potentially dangerous increases in heart rate and blood pressure will be evident.    Timing: Intermittent Modifying factors: ice, ACE bandage BP: (!) 180/100  HR: 96  Jennifer Rivers was last scheduled for an appointment on 07/10/2017 for medication management. During today's appointment we reviewed Jennifer Rivers's chronic pain status, as well as her outpatient medication regimen. She admits that her right knee pain is worse. She states that she is unable to apply pressure to her foot. She has bicompartmental arthritis  per xray. She admits that she uses ice with her medication which helps some. She is going to have to  have Catract surgery on both eyes in the future. She admits that hs has had injeciton in the past that were effective.   The patient  reports that she does not use drugs. Her body mass index is 28.35 kg/m.  Further details on both, my assessment(s), as well as the proposed treatment plan, please see below.  Controlled Substance Pharmacotherapy Assessment REMS (Risk Evaluation and Mitigation Strategy)  Analgesic:Oxycodone IR 5 mg 1 tablet by mouth every 6 hours (20 mg/dayof oxycodone) MME/day:57m/day.    WLandis Martins RN  10/04/2017 10:11 AM  Sign at close encounter Nursing Pain Medication Assessment:  Safety precautions to be maintained throughout the outpatient stay will include: orient to surroundings, keep bed in low position, maintain call bell within reach at all times, provide assistance with transfer out of bed and ambulation.  Medication Inspection Compliance: Pill count conducted under aseptic conditions, in front of the patient. Neither the pills nor the bottle was removed from the patient's sight at any time. Once count was completed pills were immediately returned to the patient in their original bottle.  Medication: Oxycodone IR Pill/Patch Count: 5 of 120 pills remain Pill/Patch Appearance: Markings consistent with prescribed medication Bottle Appearance: Standard pharmacy container. Clearly labeled. Filled Date: 04/20 / 2019 Last Medication intake:  Today   Pharmacokinetics: Liberation and absorption (onset of action): WNL Distribution (time to peak effect): WNL Metabolism and excretion (duration of action): WNL         Pharmacodynamics: Desired effects: Analgesia: Ms. RCoffelreports >50% benefit. Functional ability: Patient reports that medication allows her to accomplish basic ADLs Clinically meaningful improvement in function (CMIF): Sustained CMIF goals  met Perceived effectiveness: Described as relatively effective, allowing for increase in activities of daily living (ADL) Undesirable effects: Side-effects or Adverse reactions: None reported Monitoring: [place] PMP: Online review of the past 146-montheriod conducted. Compliant with practice rules and regulations Last UDS on record: Summary  Date Value Ref Range Status  10/05/2016 FINAL  Final    Comment:    ==================================================================== TOXASSURE SELECT 13 (MW) ==================================================================== Test                             Result       Flag       Units Drug Present and Declared for Prescription Verification   Oxycodone                      435          EXPECTED   ng/mg creat   Oxymorphone                    165          EXPECTED   ng/mg creat   Noroxycodone                   520          EXPECTED   ng/mg creat    Sources of oxycodone include scheduled prescription medications.    Oxymorphone and noroxycodone are expected metabolites of    oxycodone. Oxymorphone is also available as a scheduled    prescription medication. ==================================================================== Test                      Result    Flag   Units      Ref Range   Creatinine  51               mg/dL      >=20 ==================================================================== Declared Medications:  The flagging and interpretation on this report are based on the  following declared medications.  Unexpected results may arise from  inaccuracies in the declared medications.  **Note: The testing scope of this panel includes these medications:  Oxycodone  **Note: The testing scope of this panel does not include following  reported medications:  Albuterol  Carvedilol  Cholecalciferol  Cyanocobalamin  Cyclobenzaprine  Omeprazole ==================================================================== For  clinical consultation, please call [phone]. ====================================================================    UDS interpretation: Compliant          Medication Assessment Form: Reviewed. Patient indicates being compliant with therapy Treatment compliance: Compliant Risk Assessment Profile: Aberrant behavior: See prior evaluations. None observed or detected today Comorbid factors increasing risk of overdose: See prior notes. No additional risks detected today Risk of substance use disorder (SUD): Low  ORT Scoring interpretation table:  Score <3 = Low Risk for SUD  Score between 4-7 = Moderate Risk for SUD  Score >8 = High Risk for Opioid Abuse   Risk Mitigation Strategies:  Patient Counseling: Covered Patient-Prescriber Agreement (PPA): Present and active  Notification to other healthcare providers: Done  Pharmacologic Plan: No change in therapy, at this time.             Laboratory Chemistry  Inflammation Markers (CRP: Acute Phase) (ESR: Chronic Phase) Lab Results  Component Value Date   CRP 3.0 (H) 12/28/2015   ESRSEDRATE 55 (H) 12/28/2015                         Rheumatology Markers Lab Results  Component Value Date   RF 14.6 (H) 02/03/2016                        Renal Function Markers Lab Results  Component Value Date   BUN 15 04/18/2017   CREATININE 0.79 04/18/2017   GFRAA >60 04/18/2017   GFRNONAA >60 04/18/2017                              Hepatic Function Markers Lab Results  Component Value Date   AST 24 12/28/2015   ALT 17 12/28/2015   ALBUMIN 4.2 12/28/2015   ALKPHOS 115 12/28/2015   LIPASE 33 03/10/2015                        Electrolytes Lab Results  Component Value Date   NA 129 (L) 04/18/2017   K 3.8 04/18/2017   CL 94 (L) 04/18/2017   CALCIUM 8.8 (L) 04/18/2017   MG 1.6 (L) 04/17/2017                        Neuropathy Markers Lab Results  Component Value Date   VITAMINB12 246 04/16/2017   FOLATE 8.7 04/16/2017    HGBA1C 5.6 03/11/2015                        Bone Pathology Markers Lab Results  Component Value Date   25OHVITD1 24 (L) 12/28/2015   25OHVITD2 2.5 12/28/2015   25OHVITD3 21 12/28/2015                           Coagulation Parameters Lab Results  Component Value Date   PLT 248 04/17/2017                        Cardiovascular Markers Lab Results  Component Value Date   BNP 188.0 (H) 04/16/2017   TROPONINI <0.03 04/16/2017   HGB 10.2 (L) 04/17/2017   HCT 29.9 (L) 04/17/2017                         CA Markers No results found for: CEA, CA125, LABCA2                      Note: Lab results reviewed.  Recent Diagnostic Imaging Results  DG Chest 2 View CLINICAL DATA:  Asthma.  Wheezing and coughing.  EXAM: CHEST - 2 VIEW  COMPARISON:  April 16, 2017  FINDINGS: Scarring in the lateral right lung base. The heart, hila, mediastinum, lungs, and pleura otherwise demonstrate no acute abnormality or change.  IMPRESSION: No active cardiopulmonary disease.  Electronically Signed   By: Dorise Bullion III M.D   On: 09/15/2017 13:26  Complexity Note: Imaging results reviewed. Results shared with Ms. Stann Mainland, using Layman's terms.                         Meds   Current Outpatient Medications:  .  albuterol (VENTOLIN HFA) 108 (90 BASE) MCG/ACT inhaler, Take 2 puffs by mouth every 6 (six) hours as needed., Disp: , Rfl:  .  benzonatate (TESSALON) 100 MG capsule, Take 1 capsule (100 mg total) by mouth 3 (three) times daily., Disp: 20 capsule, Rfl: 0 .  furosemide (LASIX) 20 MG tablet, , Disp: , Rfl:  .  losartan (COZAAR) 100 MG tablet, , Disp: , Rfl:  .  omeprazole (PRILOSEC) 20 MG capsule, Take 20 mg by mouth daily. , Disp: , Rfl:  .  [START ON 11/07/2017] oxyCODONE (OXY IR/ROXICODONE) 5 MG immediate release tablet, Take 1 tablet (5 mg total) by mouth every 6 (six) hours as needed for severe pain., Disp: 120 tablet, Rfl: 0 .  [START ON 10/08/2017] pregabalin (LYRICA) 50 MG  capsule, Take 1 capsule (50 mg total) by mouth 3 (three) times daily., Disp: 90 capsule, Rfl: 2 .  [START ON 12/07/2017] oxyCODONE (OXY IR/ROXICODONE) 5 MG immediate release tablet, Take 1 tablet (5 mg total) by mouth every 6 (six) hours as needed for severe pain., Disp: 120 tablet, Rfl: 0 .  [START ON 10/08/2017] oxyCODONE (OXY IR/ROXICODONE) 5 MG immediate release tablet, Take 1 tablet (5 mg total) by mouth every 6 (six) hours as needed for severe pain., Disp: 120 tablet, Rfl: 0  ROS  Constitutional: Denies any fever or chills Gastrointestinal: No reported hemesis, hematochezia, vomiting, or acute GI distress Musculoskeletal: Denies any acute onset joint swelling, redness, loss of ROM, or weakness Neurological: No reported episodes of acute onset apraxia, aphasia, dysarthria, agnosia, amnesia, paralysis, loss of coordination, or loss of consciousness  Allergies  Ms. Riga is allergic to bupropion; codeine; gabapentin; tramadol; penicillins; and sulfa antibiotics.  [place]  Drug: Ms. Carolan  reports that she does not use drugs. Alcohol:  reports that she does not drink alcohol. Tobacco:  reports that she quit smoking about 4 weeks ago. She has never used smokeless tobacco. Medical:  has a past medical history of Asthma, GERD (gastroesophageal reflux disease), Hypertension, MRSA (methicillin resistant staph aureus) culture positive (08/2015), Multilevel  degenerative disc disease, Osteoarthritis, and Osteoporosis. Surgical: Ms. Dolloff  has a past surgical history that includes Abdominal hysterectomy; Tonsillectomy; and Knee arthroscopy (Right, 09/15/2015). Family: family history includes Diabetes in her mother; Heart attack in her father.  Constitutional Exam  General appearance: Well nourished, well developed, and well hydrated. In no apparent acute distress Vitals:   10/04/17 1006  BP: (!) 180/100  Pulse: 96  Resp: 16  Temp: 98.6 F (37 C)  TempSrc: Oral  SpO2: 98%  Weight: 155 lb (70.3  kg)  Height: 5' 2" (1.575 m)  Psych/Mental status: Alert, oriented x 3 (person, place, & time)       Eyes: PERLA Respiratory: No evidence of acute respiratory distress  Cervical Spine Area Exam  Skin & Axial Inspection: No masses, redness, edema, swelling, or associated skin lesions Alignment: Symmetrical Functional ROM: Unrestricted ROM      Stability: No instability detected Muscle Tone/Strength: Functionally intact. No obvious neuro-muscular anomalies detected. Sensory (Neurological): Unimpaired Palpation: No palpable anomalies              Upper Extremity (UE) Exam    Side: Right upper extremity  Side: Left upper extremity  Skin & Extremity Inspection: Skin color, temperature, and hair growth are WNL. No peripheral edema or cyanosis. No masses, redness, swelling, asymmetry, or associated skin lesions. No contractures.  Skin & Extremity Inspection: Skin color, temperature, and hair growth are WNL. No peripheral edema or cyanosis. No masses, redness, swelling, asymmetry, or associated skin lesions. No contractures.  Functional ROM: Unrestricted ROM          Functional ROM: Unrestricted ROM          Muscle Tone/Strength: Functionally intact. No obvious neuro-muscular anomalies detected.  Muscle Tone/Strength: Functionally intact. No obvious neuro-muscular anomalies detected.  Sensory (Neurological): Unimpaired          Sensory (Neurological): Unimpaired          Palpation: No palpable anomalies              Palpation: No palpable anomalies              Specialized Test(s): Deferred         Specialized Test(s): Deferred          Thoracic Spine Area Exam  Skin & Axial Inspection: No masses, redness, or swelling Alignment: Symmetrical Functional ROM: Unrestricted ROM Stability: No instability detected Muscle Tone/Strength: Functionally intact. No obvious neuro-muscular anomalies detected. Sensory (Neurological): Unimpaired Muscle strength & Tone: No palpable anomalies  Lumbar Spine  Area Exam  Skin & Axial Inspection: No masses, redness, or swelling Alignment: Symmetrical Functional ROM: Unrestricted ROM       Stability: No instability detected Muscle Tone/Strength: Functionally intact. No obvious neuro-muscular anomalies detected. Sensory (Neurological): Unimpaired Palpation: No palpable anomalies       Provocative Tests: Lumbar Hyperextension and rotation test: evaluation deferred today       Lumbar Lateral bending test: evaluation deferred today       Patrick's Maneuver: evaluation deferred today                    Gait & Posture Assessment  Ambulation: Unassisted Gait: Relatively normal for age and body habitus Posture: WNL   Lower Extremity Exam    Side: Right lower extremity  Side: Left lower extremity  Stability: No instability observed          Stability: No instability observed          Skin &   Extremity Inspection: Skin color, temperature, and hair growth are WNL. No peripheral edema or cyanosis. No masses, redness, swelling, asymmetry, or associated skin lesions. No contractures.  Skin & Extremity Inspection: Skin color, temperature, and hair growth are WNL. No peripheral edema or cyanosis. No masses, redness, swelling, asymmetry, or associated skin lesions. No contractures.  Functional ROM: Guarding                  Functional ROM: Unrestricted ROM                  Muscle Tone/Strength: Functionally intact. No obvious neuro-muscular anomalies detected.  Muscle Tone/Strength: Functionally intact. No obvious neuro-muscular anomalies detected.  Sensory (Neurological): Unimpaired  Sensory (Neurological): Unimpaired  Palpation: No palpable anomalies  Palpation: No palpable anomalies   Assessment  Primary Diagnosis & Pertinent Problem List: The primary encounter diagnosis was Chronic knee pain (Primary Source of Pain) (Bilateral) (R>L). Diagnoses of Spondylosis of lumbar spine, Chronic pain syndrome, and Long term current use of opiate analgesic were also  pertinent to this visit.  Status Diagnosis  Worsening Controlled Controlled 1. Chronic knee pain (Primary Source of Pain) (Bilateral) (R>L)   2. Spondylosis of lumbar spine   3. Chronic pain syndrome   4. Long term current use of opiate analgesic     Problems updated and reviewed during this visit: No problems updated. Plan of Care  Pharmacotherapy (Medications Ordered): Meds ordered this encounter  Medications  . oxyCODONE (OXY IR/ROXICODONE) 5 MG immediate release tablet    Sig: Take 1 tablet (5 mg total) by mouth every 6 (six) hours as needed for severe pain.    Dispense:  120 tablet    Refill:  0    Do not place this medication, or any other prescription from our practice, on "Automatic Refill". Patient may have prescription filled one day early if pharmacy is closed on scheduled refill date. Do not fill until: 12/07/2017 To last until:01/06/2018    Order Specific Question:   Supervising Provider    Answer:   Milinda Pointer [phone]  . oxyCODONE (OXY IR/ROXICODONE) 5 MG immediate release tablet    Sig: Take 1 tablet (5 mg total) by mouth every 6 (six) hours as needed for severe pain.    Dispense:  120 tablet    Refill:  0    Do not place this medication, or any other prescription from our practice, on "Automatic Refill". Patient may have prescription filled one day early if pharmacy is closed on scheduled refill date. Do not fill until:11/07/2017 To last until: 12/07/2017    Order Specific Question:   Supervising Provider    Answer:   Milinda Pointer [phone]  . oxyCODONE (OXY IR/ROXICODONE) 5 MG immediate release tablet    Sig: Take 1 tablet (5 mg total) by mouth every 6 (six) hours as needed for severe pain.    Dispense:  120 tablet    Refill:  0    Do not place this medication, or any other prescription from our practice, on "Automatic Refill". Patient may have prescription filled one day early if pharmacy is closed on scheduled refill date. Do not fill until:  10/08/2017 To last until:11/07/2017    Order Specific Question:   Supervising Provider    Answer:   Milinda Pointer [phone]  . pregabalin (LYRICA) 50 MG capsule    Sig: Take 1 capsule (50 mg total) by mouth 3 (three) times daily.    Dispense:  90 capsule    Refill:  2    Do not place this medication, or any other prescription from our practice, on "Automatic Refill". Patient may have prescription filled one day early if pharmacy is closed on scheduled refill date.    Order Specific Question:   Supervising Provider    Answer:   NAVEIRA, FRANCISCO [[id number]]   New Prescriptions   No medications on file   Medications administered today: Rakia Flynn had no medications administered during this visit. Lab-work, procedure(s), and/or referral(s): Orders Placed This Encounter  Procedures  . KNEE INJECTION  . ToxASSURE Select 13 (MW), Urine   Imaging and/or referral(s): None  Interventional therapies: Planned, scheduled, and/or pending:  right bilateral intra-articular knee injection   Considering:  Left cervical epidural steroid injection #2 Diagnostic bilateral intra-articular knee injection Possible series of 5 bilateral Hyalgan intra-articular knee injections Diagnostic bilateral genicular nerve blocks  Possible bilateral genicular nerve radiofrequency ablation Diagnostic bilateral lumbar facet block  Possible bilateral lumbar facet radiofrequency ablation  Diagnostic bilateral intra-articular shoulder joint injection  Diagnostic bilateral suprascapular nerve block  Possible bilateral suprascapular nerve radiofrequency ablation  Diagnostic right-sided L5-S1 transforaminal epidural steroid injection Diagnostic left sided S1 transforaminal epidural steroid injection Diagnostic right-sided L4-5 lumbar epidural steroid injection    Palliative PRN treatment(s):  Palliative bilateral intra-articular knee injection with local anesthetic and steroid. Left cervical epidural  steroid injection #2      Provider-requested follow-up: Return in about 3 months (around 01/04/2018) for MedMgmt with Me (Crystal King), in addition, Procedure(NS), w/ Dr. Naveira, (ASAP).  Future Appointments  Date Time Provider Department Center  10/25/2017 10:15 AM Naveira, Francisco, MD [facility] None  01/01/2018  9:30 AM King, Crystal M, NP [facility] None   Primary Care Physician: Anderson, Marshall W, MD Location: [facility] Facility Note by: Crystal M. King NP Date: 10/04/2017; Time: 1:01 PM  Pain Score Disclaimer: We use the NRS-11 scale. This is a self-reported, subjective measurement of pain severity with only modest accuracy. It is used primarily to identify changes within a particular patient. It must be understood that outpatient pain scales are significantly less accurate that those used for research, where they can be applied under ideal controlled circumstances with minimal exposure to variables. In reality, the score is likely to be a combination of pain intensity and pain affect, where pain affect describes the degree of emotional arousal or changes in action readiness caused by the sensory experience of pain. Factors such as social and work situation, setting, emotional state, anxiety levels, expectation, and prior pain experience may influence pain perception and show large inter-individual differences that may also be affected by time variables.  Patient instructions provided during this appointment: Patient Instructions   ____________________________________________________________________________________________  Medication Rules  Applies to: All patients receiving prescriptions (written or electronic).  Pharmacy of record: Pharmacy where electronic prescriptions will be sent. If written prescriptions are taken to a different pharmacy, please inform the nursing staff. The pharmacy listed in the electronic medical record should be the one where you  would like electronic prescriptions to be sent.  Prescription refills: Only during scheduled appointments. Applies to both, written and electronic prescriptions.  NOTE: The following applies primarily to controlled substances (Opioid* Pain Medications).   Patient's responsibilities: 1. Pain Pills: Bring all pain pills to every appointment (except for procedure appointments). 2. Pill Bottles: Bring pills in original pharmacy bottle. Always bring newest bottle. Bring bottle, even if empty. 3. Medication refills: You are responsible for knowing and keeping track of what medications you need   refilled. The day before your appointment, write a list of all prescriptions that need to be refilled. Bring that list to your appointment and give it to the admitting nurse. Prescriptions will be written only during appointments. If you forget a medication, it will not be "Called in", "Faxed", or "electronically sent". You will need to get another appointment to get these prescribed. 4. Prescription Accuracy: You are responsible for carefully inspecting your prescriptions before leaving our office. Have the discharge nurse carefully go over each prescription with you, before taking them home. Make sure that your name is accurately spelled, that your address is correct. Check the name and dose of your medication to make sure it is accurate. Check the number of pills, and the written instructions to make sure they are clear and accurate. Make sure that you are given enough medication to last until your next medication refill appointment. 5. Taking Medication: Take medication as prescribed. Never take more pills than instructed. Never take medication more frequently than prescribed. Taking less pills or less frequently is permitted and encouraged, when it comes to controlled substances (written prescriptions).  6. Inform other Doctors: Always inform, all of your healthcare providers, of all the medications you  take. 7. Pain Medication from other Providers: You are not allowed to accept any additional pain medication from any other Doctor or Healthcare provider. There are two exceptions to this rule. (see below) In the event that you require additional pain medication, you are responsible for notifying us, as stated below. 8. Medication Agreement: You are responsible for carefully reading and following our Medication Agreement. This must be signed before receiving any prescriptions from our practice. Safely store a copy of your signed Agreement. Violations to the Agreement will result in no further prescriptions. (Additional copies of our Medication Agreement are available upon request.) 9. Laws, Rules, & Regulations: All patients are expected to follow all Federal and State Laws, Statutes, Rules, & Regulations. Ignorance of the Laws does not constitute a valid excuse. The use of any illegal substances is prohibited. 10. Adopted CDC guidelines & recommendations: Target dosing levels will be at or below 60 MME/day. Use of benzodiazepines** is not recommended.  Exceptions: There are only two exceptions to the rule of not receiving pain medications from other Healthcare Providers. 1. Exception #1 (Emergencies): In the event of an emergency (i.e.: accident requiring emergency care), you are allowed to receive additional pain medication. However, you are responsible for: As soon as you are able, call our office [phone], at any time of the day or night, and leave a message stating your name, the date and nature of the emergency, and the name and dose of the medication prescribed. In the event that your call is answered by a member of our staff, make sure to document and save the date, time, and the name of the person that took your information.  2. Exception #2 (Planned Surgery): In the event that you are scheduled by another doctor or dentist to have any type of surgery or procedure, you are allowed (for a period  no longer than 30 days), to receive additional pain medication, for the acute post-op pain. However, in this case, you are responsible for picking up a copy of our "Post-op Pain Management for Surgeons" handout, and giving it to your surgeon or dentist. This document is available at our office, and does not require an appointment to obtain it. Simply go to our office during business hours (Monday-Thursday from 8:00 AM to   4:00 PM) (Friday 8:00 AM to 12:00 Noon) or if you have a scheduled appointment with [place], prior to your surgery, and ask for it by name. In addition, you will need to provide [place] with your name, name of your surgeon, type of surgery, and date of procedure or surgery.  *Opioid medications include: morphine, codeine, oxycodone, oxymorphone, hydrocodone, hydromorphone, meperidine, tramadol, tapentadol, buprenorphine, fentanyl, methadone. **Benzodiazepine medications include: diazepam (Valium), alprazolam (Xanax), clonazepam (Klonopine), lorazepam (Ativan), clorazepate (Tranxene), chlordiazepoxide (Librium), estazolam (Prosom), oxazepam (Serax), temazepam (Restoril), triazolam (Halcion) (Last updated: 07/19/2017) ____________________________________________________________________________________________   ____________________________________________________________________________________________  Pain Scale  Introduction: The pain score used by this practice is the Verbal Numerical Rating Scale (VNRS-11). This is an 11-point scale. It is for adults and children 10 years or older. There are significant differences in how the pain score is reported, used, and applied. Forget everything you learned in the past and learn this scoring system.  General Information: The scale should reflect your current level of pain. Unless you are specifically asked for the level of your worst pain, or your average pain. If you are asked for one of these two, then it should be understood that it is over the past  24 hours.  Basic Activities of Daily Living (ADL): Personal hygiene, dressing, eating, transferring, and using restroom.  Instructions: Most patients tend to report their level of pain as a combination of two factors, their physical pain and their psychosocial pain. This last one is also known as "suffering" and it is reflection of how physical pain affects you socially and psychologically. From now on, report them separately. From this point on, when asked to report your pain level, report only your physical pain. Use the following table for reference.  Pain Clinic Pain Levels (0-5/10)  Pain Level Score  Description  No Pain 0   Mild pain 1 Nagging, annoying, but does not interfere with basic activities of daily living (ADL). Patients are able to eat, bathe, get dressed, toileting (being able to get on and off the toilet and perform personal hygiene functions), transfer (move in and out of bed or a chair without assistance), and maintain continence (able to control bladder and bowel functions). Blood pressure and heart rate are unaffected. A normal heart rate for a healthy adult ranges from 60 to 100 bpm (beats per minute).   Mild to moderate pain 2 Noticeable and distracting. Impossible to hide from other people. More frequent flare-ups. Still possible to adapt and function close to normal. It can be very annoying and may have occasional stronger flare-ups. With discipline, patients may get used to it and adapt.   Moderate pain 3 Interferes significantly with activities of daily living (ADL). It becomes difficult to feed, bathe, get dressed, get on and off the toilet or to perform personal hygiene functions. Difficult to get in and out of bed or a chair without assistance. Very distracting. With effort, it can be ignored when deeply involved in activities.   Moderately severe pain 4 Impossible to ignore for more than a few minutes. With effort, patients may still be able to manage work or  participate in some social activities. Very difficult to concentrate. Signs of autonomic nervous system discharge are evident: dilated pupils (mydriasis); mild sweating (diaphoresis); sleep interference. Heart rate becomes elevated (>115 bpm). Diastolic blood pressure (lower number) rises above 100 mmHg. Patients find relief in laying down and not moving.   Severe pain 5 Intense and extremely unpleasant. Associated with frowning face and frequent crying. Pain  overwhelms the senses.  Ability to do any activity or maintain social relationships becomes significantly limited. Conversation becomes difficult. Pacing back and forth is common, as getting into a comfortable position is nearly impossible. Pain wakes you up from deep sleep. Physical signs will be obvious: pupillary dilation; increased sweating; goosebumps; brisk reflexes; cold, clammy hands and feet; nausea, vomiting or dry heaves; loss of appetite; significant sleep disturbance with inability to fall asleep or to remain asleep. When persistent, significant weight loss is observed due to the complete loss of appetite and sleep deprivation.  Blood pressure and heart rate becomes significantly elevated. Caution: If elevated blood pressure triggers a pounding headache associated with blurred vision, then the patient should immediately seek attention at an urgent or emergency care unit, as these may be signs of an impending stroke.    Emergency Department Pain Levels (6-10/10)  Emergency Room Pain 6 Severely limiting. Requires emergency care and should not be seen or managed at an outpatient pain management facility. Communication becomes difficult and requires great effort. Assistance to reach the emergency department may be required. Facial flushing and profuse sweating along with potentially dangerous increases in heart rate and blood pressure will be evident.   Distressing pain 7 Self-care is very difficult. Assistance is required to transport, or  use restroom. Assistance to reach the emergency department will be required. Tasks requiring coordination, such as bathing and getting dressed become very difficult.   Disabling pain 8 Self-care is no longer possible. At this level, pain is disabling. The individual is unable to do even the most "basic" activities such as walking, eating, bathing, dressing, transferring to a bed, or toileting. Fine motor skills are lost. It is difficult to think clearly.   Incapacitating pain 9 Pain becomes incapacitating. Thought processing is no longer possible. Difficult to remember your own name. Control of movement and coordination are lost.   The worst pain imaginable 10 At this level, most patients pass out from pain. When this level is reached, collapse of the autonomic nervous system occurs, leading to a sudden drop in blood pressure and heart rate. This in turn results in a temporary and dramatic drop in blood flow to the brain, leading to a loss of consciousness. Fainting is one of the body's self defense mechanisms. Passing out puts the brain in a calmed state and causes it to shut down for a while, in order to begin the healing process.    Summary: 1. Refer to this scale when providing us with your pain level. 2. Be accurate and careful when reporting your pain level. This will help with your care. 3. Over-reporting your pain level will lead to loss of credibility. 4. Even a level of 1/10 means that there is pain and will be treated at our facility. 5. High, inaccurate reporting will be documented as "Symptom Exaggeration", leading to loss of credibility and suspicions of possible secondary gains such as obtaining more narcotics, or wanting to appear disabled, for fraudulent reasons. 6. Only pain levels of 5 or below will be seen at our facility. 7. Pain levels of 6 and above will be sent to the Emergency Department and the appointment  cancelled. ____________________________________________________________________________________________    BMI Assessment: Estimated body mass index is 28.35 kg/m as calculated from the following:   Height as of this encounter: 5' 2" (1.575 m).   Weight as of this encounter: 155 lb (70.3 kg).  BMI interpretation table: BMI level Category Range association with higher incidence of chronic pain  <  18 kg/m2 Underweight   18.5-24.9 kg/m2 Ideal body weight   25-29.9 kg/m2 Overweight Increased incidence by 20%  30-34.9 kg/m2 Obese (Class I) Increased incidence by 68%  35-39.9 kg/m2 Severe obesity (Class II) Increased incidence by 136%  >40 kg/m2 Extreme obesity (Class III) Increased incidence by 254%   Patient's current BMI Ideal Body weight  Body mass index is 28.35 kg/m. Ideal body weight: 50.1 kg (110 lb 7.2 oz) Adjusted ideal body weight: 58.2 kg (128 lb 4.3 oz)   BMI Readings from Last 4 Encounters:  10/04/17 28.35 kg/m  09/15/17 28.35 kg/m  07/10/17 29.26 kg/m  05/31/17 29.26 kg/m   Wt Readings from Last 4 Encounters:  10/04/17 155 lb (70.3 kg)  09/15/17 155 lb (70.3 kg)  07/10/17 160 lb (72.6 kg)  05/31/17 160 lb (72.6 kg)

## 2017-10-04 NOTE — Patient Instructions (Addendum)
____________________________________________________________________________________________  Medication Rules  Applies to: All patients receiving prescriptions (written or electronic).  Pharmacy of record: Pharmacy where electronic prescriptions will be sent. If written prescriptions are taken to a different pharmacy, please inform the nursing staff. The pharmacy listed in the electronic medical record should be the one where you would like electronic prescriptions to be sent.  Prescription refills: Only during scheduled appointments. Applies to both, written and electronic prescriptions.  NOTE: The following applies primarily to controlled substances (Opioid* Pain Medications).   Patient's responsibilities: 1. Pain Pills: Bring all pain pills to every appointment (except for procedure appointments). 2. Pill Bottles: Bring pills in original pharmacy bottle. Always bring newest bottle. Bring bottle, even if empty. 3. Medication refills: You are responsible for knowing and keeping track of what medications you need refilled. The day before your appointment, write a list of all prescriptions that need to be refilled. Bring that list to your appointment and give it to the admitting nurse. Prescriptions will be written only during appointments. If you forget a medication, it will not be "Called in", "Faxed", or "electronically sent". You will need to get another appointment to get these prescribed. 4. Prescription Accuracy: You are responsible for carefully inspecting your prescriptions before leaving our office. Have the discharge nurse carefully go over each prescription with you, before taking them home. Make sure that your name is accurately spelled, that your address is correct. Check the name and dose of your medication to make sure it is accurate. Check the number of pills, and the written instructions to make sure they are clear and accurate. Make sure that you are given enough medication to last  until your next medication refill appointment. 5. Taking Medication: Take medication as prescribed. Never take more pills than instructed. Never take medication more frequently than prescribed. Taking less pills or less frequently is permitted and encouraged, when it comes to controlled substances (written prescriptions).  6. Inform other Doctors: Always inform, all of your healthcare providers, of all the medications you take. 7. Pain Medication from other Providers: You are not allowed to accept any additional pain medication from any other Doctor or Healthcare provider. There are two exceptions to this rule. (see below) In the event that you require additional pain medication, you are responsible for notifying us, as stated below. 8. Medication Agreement: You are responsible for carefully reading and following our Medication Agreement. This must be signed before receiving any prescriptions from our practice. Safely store a copy of your signed Agreement. Violations to the Agreement will result in no further prescriptions. (Additional copies of our Medication Agreement are available upon request.) 9. Laws, Rules, & Regulations: All patients are expected to follow all Federal and State Laws, Statutes, Rules, & Regulations. Ignorance of the Laws does not constitute a valid excuse. The use of any illegal substances is prohibited. 10. Adopted CDC guidelines & recommendations: Target dosing levels will be at or below 60 MME/day. Use of benzodiazepines** is not recommended.  Exceptions: There are only two exceptions to the rule of not receiving pain medications from other Healthcare Providers. 1. Exception #1 (Emergencies): In the event of an emergency (i.e.: accident requiring emergency care), you are allowed to receive additional pain medication. However, you are responsible for: As soon as you are able, call our office [phone], at any time of the day or night, and leave a message stating your name, the  date and nature of the emergency, and the name and dose of the medication   prescribed. In the event that your call is answered by a member of our staff, make sure to document and save the date, time, and the name of the person that took your information.  2. Exception #2 (Planned Surgery): In the event that you are scheduled by another doctor or dentist to have any type of surgery or procedure, you are allowed (for a period no longer than 30 days), to receive additional pain medication, for the acute post-op pain. However, in this case, you are responsible for picking up a copy of our "Post-op Pain Management for Surgeons" handout, and giving it to your surgeon or dentist. This document is available at our office, and does not require an appointment to obtain it. Simply go to our office during business hours (Monday-Thursday from 8:00 AM to 4:00 PM) (Friday 8:00 AM to 12:00 Noon) or if you have a scheduled appointment with us, prior to your surgery, and ask for it by name. In addition, you will need to provide us with your name, name of your surgeon, type of surgery, and date of procedure or surgery.  *Opioid medications include: morphine, codeine, oxycodone, oxymorphone, hydrocodone, hydromorphone, meperidine, tramadol, tapentadol, buprenorphine, fentanyl, methadone. **Benzodiazepine medications include: diazepam (Valium), alprazolam (Xanax), clonazepam (Klonopine), lorazepam (Ativan), clorazepate (Tranxene), chlordiazepoxide (Librium), estazolam (Prosom), oxazepam (Serax), temazepam (Restoril), triazolam (Halcion) (Last updated: 07/19/2017) ____________________________________________________________________________________________   ____________________________________________________________________________________________  Pain Scale  Introduction: The pain score used by this practice is the Verbal Numerical Rating Scale (VNRS-11). This is an 11-point scale. It is for adults and children 10 years or  older. There are significant differences in how the pain score is reported, used, and applied. Forget everything you learned in the past and learn this scoring system.  General Information: The scale should reflect your current level of pain. Unless you are specifically asked for the level of your worst pain, or your average pain. If you are asked for one of these two, then it should be understood that it is over the past 24 hours.  Basic Activities of Daily Living (ADL): Personal hygiene, dressing, eating, transferring, and using restroom.  Instructions: Most patients tend to report their level of pain as a combination of two factors, their physical pain and their psychosocial pain. This last one is also known as "suffering" and it is reflection of how physical pain affects you socially and psychologically. From now on, report them separately. From this point on, when asked to report your pain level, report only your physical pain. Use the following table for reference.  Pain Clinic Pain Levels (0-5/10)  Pain Level Score  Description  No Pain 0   Mild pain 1 Nagging, annoying, but does not interfere with basic activities of daily living (ADL). Patients are able to eat, bathe, get dressed, toileting (being able to get on and off the toilet and perform personal hygiene functions), transfer (move in and out of bed or a chair without assistance), and maintain continence (able to control bladder and bowel functions). Blood pressure and heart rate are unaffected. A normal heart rate for a healthy adult ranges from 60 to 100 bpm (beats per minute).   Mild to moderate pain 2 Noticeable and distracting. Impossible to hide from other people. More frequent flare-ups. Still possible to adapt and function close to normal. It can be very annoying and may have occasional stronger flare-ups. With discipline, patients may get used to it and adapt.   Moderate pain 3 Interferes significantly with activities of daily  living (ADL).   It becomes difficult to feed, bathe, get dressed, get on and off the toilet or to perform personal hygiene functions. Difficult to get in and out of bed or a chair without assistance. Very distracting. With effort, it can be ignored when deeply involved in activities.   Moderately severe pain 4 Impossible to ignore for more than a few minutes. With effort, patients may still be able to manage work or participate in some social activities. Very difficult to concentrate. Signs of autonomic nervous system discharge are evident: dilated pupils (mydriasis); mild sweating (diaphoresis); sleep interference. Heart rate becomes elevated (>115 bpm). Diastolic blood pressure (lower number) rises above 100 mmHg. Patients find relief in laying down and not moving.   Severe pain 5 Intense and extremely unpleasant. Associated with frowning face and frequent crying. Pain overwhelms the senses.  Ability to do any activity or maintain social relationships becomes significantly limited. Conversation becomes difficult. Pacing back and forth is common, as getting into a comfortable position is nearly impossible. Pain wakes you up from deep sleep. Physical signs will be obvious: pupillary dilation; increased sweating; goosebumps; brisk reflexes; cold, clammy hands and feet; nausea, vomiting or dry heaves; loss of appetite; significant sleep disturbance with inability to fall asleep or to remain asleep. When persistent, significant weight loss is observed due to the complete loss of appetite and sleep deprivation.  Blood pressure and heart rate becomes significantly elevated. Caution: If elevated blood pressure triggers a pounding headache associated with blurred vision, then the patient should immediately seek attention at an urgent or emergency care unit, as these may be signs of an impending stroke.    Emergency Department Pain Levels (6-10/10)  Emergency Room Pain 6 Severely limiting. Requires emergency care  and should not be seen or managed at an outpatient pain management facility. Communication becomes difficult and requires great effort. Assistance to reach the emergency department may be required. Facial flushing and profuse sweating along with potentially dangerous increases in heart rate and blood pressure will be evident.   Distressing pain 7 Self-care is very difficult. Assistance is required to transport, or use restroom. Assistance to reach the emergency department will be required. Tasks requiring coordination, such as bathing and getting dressed become very difficult.   Disabling pain 8 Self-care is no longer possible. At this level, pain is disabling. The individual is unable to do even the most "basic" activities such as walking, eating, bathing, dressing, transferring to a bed, or toileting. Fine motor skills are lost. It is difficult to think clearly.   Incapacitating pain 9 Pain becomes incapacitating. Thought processing is no longer possible. Difficult to remember your own name. Control of movement and coordination are lost.   The worst pain imaginable 10 At this level, most patients pass out from pain. When this level is reached, collapse of the autonomic nervous system occurs, leading to a sudden drop in blood pressure and heart rate. This in turn results in a temporary and dramatic drop in blood flow to the brain, leading to a loss of consciousness. Fainting is one of the body's self defense mechanisms. Passing out puts the brain in a calmed state and causes it to shut down for a while, in order to begin the healing process.    Summary: 1. Refer to this scale when providing us with your pain level. 2. Be accurate and careful when reporting your pain level. This will help with your care. 3. Over-reporting your pain level will lead to loss of credibility. 4. Even   a level of 1/10 means that there is pain and will be treated at our facility. 5. High, inaccurate reporting will be  documented as "Symptom Exaggeration", leading to loss of credibility and suspicions of possible secondary gains such as obtaining more narcotics, or wanting to appear disabled, for fraudulent reasons. 6. Only pain levels of 5 or below will be seen at our facility. 7. Pain levels of 6 and above will be sent to the Emergency Department and the appointment cancelled. ____________________________________________________________________________________________    BMI Assessment: Estimated body mass index is 28.35 kg/m as calculated from the following:   Height as of this encounter:  (1.575 m).   Weight as of this encounter: 155 lb (70.3 kg).  BMI interpretation table: BMI level Category Range association with higher incidence of chronic pain  <18 kg/m2 Underweight   18.5-24.9 kg/m2 Ideal body weight   25-29.9 kg/m2 Overweight Increased incidence by 20%  30-34.9 kg/m2 Obese (Class I) Increased incidence by 68%  35-39.9 kg/m2 Severe obesity (Class II) Increased incidence by 136%  >40 kg/m2 Extreme obesity (Class III) Increased incidence by 254%   Patient's current BMI Ideal Body weight  Body mass index is 28.35 kg/m. Ideal body weight: 50.1 kg (110 lb 7.2 oz) Adjusted ideal body weight: 58.2 kg (128 lb 4.3 oz)   BMI Readings from Last 4 Encounters:  10/04/17 28.35 kg/m  09/15/17 28.35 kg/m  07/10/17 29.26 kg/m  05/31/17 29.26 kg/m   Wt Readings from Last 4 Encounters:  10/04/17 155 lb (70.3 kg)  09/15/17 155 lb (70.3 kg)  07/10/17 160 lb (72.6 kg)  05/31/17 160 lb (72.6 kg)

## 2017-10-04 NOTE — Progress Notes (Signed)
Nursing Pain Medication Assessment:  Safety precautions to be maintained throughout the outpatient stay will include: orient to surroundings, keep bed in low position, maintain call bell within reach at all times, provide assistance with transfer out of bed and ambulation.  Medication Inspection Compliance: Pill count conducted under aseptic conditions, in front of the patient. Neither the pills nor the bottle was removed from the patient's sight at any time. Once count was completed pills were immediately returned to the patient in their original bottle.  Medication: Oxycodone IR Pill/Patch Count: 5 of 120 pills remain Pill/Patch Appearance: Markings consistent with prescribed medication Bottle Appearance: Standard pharmacy container. Clearly labeled. Filled Date: 04/20 / 2019 Last Medication intake:  Today

## 2017-10-11 LAB — TOXASSURE SELECT 13 (MW), URINE

## 2017-10-25 ENCOUNTER — Other Ambulatory Visit: Payer: Self-pay

## 2017-10-25 ENCOUNTER — Ambulatory Visit: Payer: Medicare HMO | Attending: Pain Medicine | Admitting: Pain Medicine

## 2017-10-25 ENCOUNTER — Encounter: Payer: Self-pay | Admitting: Pain Medicine

## 2017-10-25 VITALS — BP 158/86 | HR 93 | Temp 98.5°F | Resp 16 | Ht 62.0 in | Wt 155.0 lb

## 2017-10-25 DIAGNOSIS — M25561 Pain in right knee: Secondary | ICD-10-CM | POA: Diagnosis not present

## 2017-10-25 DIAGNOSIS — G8929 Other chronic pain: Secondary | ICD-10-CM

## 2017-10-25 DIAGNOSIS — M25562 Pain in left knee: Secondary | ICD-10-CM | POA: Insufficient documentation

## 2017-10-25 DIAGNOSIS — M170 Bilateral primary osteoarthritis of knee: Secondary | ICD-10-CM | POA: Diagnosis not present

## 2017-10-25 MED ORDER — METHYLPREDNISOLONE ACETATE 80 MG/ML IJ SUSP
80.0000 mg | Freq: Once | INTRAMUSCULAR | Status: AC
  Administered 2017-10-25: 80 mg via INTRA_ARTICULAR

## 2017-10-25 MED ORDER — LIDOCAINE HCL (PF) 1 % IJ SOLN
INTRAMUSCULAR | Status: AC
  Filled 2017-10-25: qty 5

## 2017-10-25 MED ORDER — ROPIVACAINE HCL 2 MG/ML IJ SOLN
INTRAMUSCULAR | Status: AC
  Filled 2017-10-25: qty 10

## 2017-10-25 MED ORDER — METHYLPREDNISOLONE ACETATE 80 MG/ML IJ SUSP
INTRAMUSCULAR | Status: AC
  Filled 2017-10-25: qty 1

## 2017-10-25 MED ORDER — LIDOCAINE HCL (PF) 1 % IJ SOLN
5.0000 mL | Freq: Once | INTRAMUSCULAR | Status: AC
  Administered 2017-10-25: 10 mL

## 2017-10-25 MED ORDER — METHYLPREDNISOLONE ACETATE 80 MG/ML IJ SUSP
80.0000 mg | Freq: Once | INTRAMUSCULAR | Status: AC
  Administered 2017-10-25: 160 mg via INTRA_ARTICULAR
  Filled 2017-10-25: qty 1

## 2017-10-25 MED ORDER — ROPIVACAINE HCL 2 MG/ML IJ SOLN
2.0000 mL | Freq: Once | INTRAMUSCULAR | Status: AC
  Administered 2017-10-25: 4 mL via INTRA_ARTICULAR

## 2017-10-25 MED ORDER — ROPIVACAINE HCL 2 MG/ML IJ SOLN: 2.0000 mL | Freq: Once | INTRAMUSCULAR | Status: DC

## 2017-10-25 NOTE — Progress Notes (Signed)
Patient's Name: Jennifer Rivers  MRN: [id number]  Referring Provider: Lauro RegulusAnderson, Marshall W, MD  DOB: 29-Jan-1946  PCP: Lauro RegulusAnderson, Marshall W, MD  DOS: 10/25/2017  Note by: Oswaldo DoneFrancisco A Kaytelyn Glore, MD  Service setting: Ambulatory outpatient  Specialty: Interventional Pain Management  Patient type: Established  Location: [facility] (AMB) Pain Management Facility  Visit type: Interventional Procedure   Primary Reason for Visit: Interventional Pain Management Treatment. CC: Knee Pain  Procedure:  Anesthesia, Analgesia, Anxiolysis:  Type: Diagnostic Intra-Articular Local anesthetic and steroid Knee Injection #1  & aspiration Region: Lateral infrapatellar Knee Region Level: Knee Joint Laterality: Bilateral  Type: Local Anesthesia Indication(s): Analgesia         Local Anesthetic: Lidocaine 1-2% Route: Infiltration ([place]/IM) IV Access: Declined Sedation: Declined    Indications: 1. Osteoarthritis of knees (Bilateral)   2. Chronic knee pain (Primary Source of Pain) (Bilateral) (R>L)    Pain Score: Pre-procedure: 5 /10 Post-procedure: (right 3  left 0)/10  Pre-op Assessment:  Ms. Jennifer Rivers is a 72 y.o. (year old), female patient, seen today for interventional treatment. She  has a past surgical history that includes Abdominal hysterectomy; Tonsillectomy; and Knee arthroscopy (Right, 09/15/2015). Ms. Jennifer Rivers has a current medication list which includes the following prescription(s): albuterol, benzonatate, furosemide, losartan, omeprazole, oxycodone, oxycodone, oxycodone, and pregabalin, and the following Facility-Administered Medications: ropivacaine (pf) 2 mg/ml (0.2%). Her primarily concern today is the Knee Pain  Initial Vital Signs:  Pulse/HCG Rate: 100  Temp: 98.5 F (36.9 C) BP: (!) 146/76 SpO2: 99 %  BMI: Estimated body mass index is 28.35 kg/m as calculated from the following:   Height as of this encounter: 5\' 2"  (1.575 m).   Weight as of this encounter: 155 lb (70.3 kg).  Risk  Assessment: Allergies: Reviewed. She is allergic to bupropion; codeine; gabapentin; tramadol; penicillins; and sulfa antibiotics.  Allergy Precautions: None required Coagulopathies: Reviewed. None identified.  Blood-thinner therapy: None at this time Active Infection(s): Reviewed. None identified. Ms. Jennifer Rivers is afebrile  Site Confirmation: Ms. Jennifer Rivers was asked to confirm the procedure and laterality before marking the site Procedure checklist: Completed Consent: Before the procedure and under the influence of no sedative(s), amnesic(s), or anxiolytics, the patient was informed of the treatment options, risks and possible complications. To fulfill our ethical and legal obligations, as recommended by the American Medical Association's Code of Ethics, I have informed the patient of my clinical impression; the nature and purpose of the treatment or procedure; the risks, benefits, and possible complications of the intervention; the alternatives, including doing nothing; the risk(s) and benefit(s) of the alternative treatment(s) or procedure(s); and the risk(s) and benefit(s) of doing nothing. The patient was provided information about the general risks and possible complications associated with the procedure. These may include, but are not limited to: failure to achieve desired goals, infection, bleeding, organ or nerve damage, allergic reactions, paralysis, and death. In addition, the patient was informed of those risks and complications associated to the procedure, such as failure to decrease pain; infection; bleeding; organ or nerve damage with subsequent damage to sensory, motor, and/or autonomic systems, resulting in permanent pain, numbness, and/or weakness of one or several areas of the body; allergic reactions; (i.e.: anaphylactic reaction); and/or death. Furthermore, the patient was informed of those risks and complications associated with the medications. These include, but are not limited to:  allergic reactions (i.e.: anaphylactic or anaphylactoid reaction(s)); adrenal axis suppression; blood sugar elevation that in diabetics may result in ketoacidosis or comma; water retention that in patients with history  of congestive heart failure may result in shortness of breath, pulmonary edema, and decompensation with resultant heart failure; weight gain; swelling or edema; medication-induced neural toxicity; particulate matter embolism and blood vessel occlusion with resultant organ, and/or nervous system infarction; and/or aseptic necrosis of one or more joints. Finally, the patient was informed that Medicine is not an exact science; therefore, there is also the possibility of unforeseen or unpredictable risks and/or possible complications that may result in a catastrophic outcome. The patient indicated having understood very clearly. We have given the patient no guarantees and we have made no promises. Enough time was given to the patient to ask questions, all of which were answered to the patient's satisfaction. Ms. Hoselton has indicated that she wanted to continue with the procedure. Attestation: I, the ordering provider, attest that I have discussed with the patient the benefits, risks, side-effects, alternatives, likelihood of achieving goals, and potential problems during recovery for the procedure that I have provided informed consent. Date  Time: 10/25/2017 10:19 AM  Pre-Procedure Preparation:  Monitoring: As per clinic protocol. Respiration, ETCO2, SpO2, BP, heart rate and rhythm monitor placed and checked for adequate function Safety Precautions: Patient was assessed for positional comfort and pressure points before starting the procedure. Time-out: I initiated and conducted the "Time-out" before starting the procedure, as per protocol. The patient was asked to participate by confirming the accuracy of the "Time Out" information. Verification of the correct person, site, and procedure were  performed and confirmed by me, the nursing staff, and the patient. "Time-out" conducted as per Joint Commission's Universal Protocol (UP.01.01.01). Time: 1054  Description of Procedure:       Position: Sitting Target Area: Knee Joint Approach: Just above the Lateral tibial plateau, lateral to the infrapatellar tendon. Area Prepped: Entire knee area, from the mid-thigh to the mid-shin. Prepping solution: ChloraPrep (2% chlorhexidine gluconate and 70% isopropyl alcohol) Safety Precautions: Aspiration looking for blood return was conducted prior to all injections. At no point did we inject any substances, as a needle was being advanced. No attempts were made at seeking any paresthesias. Safe injection practices and needle disposal techniques used. Medications properly checked for expiration dates. SDV (single dose vial) medications used. Description of the Procedure: Protocol guidelines were followed. The patient was placed in position over the fluoroscopy table. The target area was identified and the area prepped in the usual manner. Skin desensitized using vapocoolant spray. Skin & deeper tissues infiltrated with local anesthetic. Appropriate amount of time allowed to pass for local anesthetics to take effect. The procedure needles were then advanced to the target area. Proper needle placement secured. Negative aspiration confirmed. No fluid obtained from either knee during the aspiration part of the procedure. Solution injected in intermittent fashion, asking for systemic symptoms every 0.5cc of injectate. The needles were then removed and the area cleansed, making sure to leave some of the prepping solution back to take advantage of its long term bactericidal properties. Vitals:   10/25/17 1017 10/25/17 1100  BP: (!) 146/76 (!) 158/86  Pulse: 100 93  Resp:  16  Temp: 98.5 F (36.9 C)   SpO2: 99% 94%  Weight: 155 lb (70.3 kg)   Height: 5\' 2"  (1.575 m)     Start Time: 1054 hrs. End Time: 1058  hrs. Materials:  Needle(s) Type: Regular needle Gauge: 25G Length: 1.5-in Medication(s): Please see orders for medications and dosing details.  Imaging Guidance:  Type of Imaging Technique: None used Indication(s): N/A Exposure Time: No patient exposure  Contrast: None used. Fluoroscopic Guidance: N/A Ultrasound Guidance: N/A Interpretation: N/A  Antibiotic Prophylaxis:   Anti-infectives (From admission, onward)   None     Indication(s): None identified  Post-operative Assessment:  Post-procedure Vital Signs:  Pulse/HCG Rate: 93  Temp: 98.5 F (36.9 C) BP: (!) 158/86 SpO2: 94 %  EBL: None  Complications: No immediate post-treatment complications observed by team, or reported by patient.  Note: The patient tolerated the entire procedure well. A repeat set of vitals were taken after the procedure and the patient was kept under observation following institutional policy, for this type of procedure. Post-procedural neurological assessment was performed, showing return to baseline, prior to discharge. The patient was provided with post-procedure discharge instructions, including a section on how to identify potential problems. Should any problems arise concerning this procedure, the patient was given instructions to immediately contact us, at any time, without hesitation. In any case, we plan to contact the patient by telephone for a follow-up status report regarding this interventional procedure.  Comments:  No additional relevant information.  Plan of Care   Imaging Orders  No imaging studies ordered today    Procedure Orders     KNEE INJECTION  Medications ordered for procedure: Meds ordered this encounter  Medications  . lidocaine (PF) (XYLOCAINE) 1 % injection 5 mL  . ropivacaine (PF) 2 mg/mL (0.2%) (NAROPIN) injection 2 mL  . methylPREDNISolone acetate (DEPO-MEDROL) injection 80 mg  . methylPREDNISolone acetate (DEPO-MEDROL) injection 80 mg  . ropivacaine (PF)  2 mg/mL (0.2%) (NAROPIN) injection 2 mL   Medications administered: We administered lidocaine (PF), methylPREDNISolone acetate, methylPREDNISolone acetate, and ropivacaine (PF) 2 mg/mL (0.2%).  See the medical record for exact dosing, route, and time of administration.  New Prescriptions   No medications on file   Disposition: Discharge home  Discharge Date & Time: 10/25/2017; 1106 hrs.   Physician-requested Follow-up: Return for post-procedure eval (2 wks), w/ Dr. Laban Emperor.  Future Appointments  Date Time Provider Department Center  11/07/2017  1:30 PM Delano Metz, MD [facility] None  01/01/2018  9:30 AM Barbette Merino, NP [facility] None   Primary Care Physician: Lauro Regulus, MD Location: [facility] Note by: Oswaldo Done, MD Date: 10/25/2017; Time: 11:03 AM  Disclaimer:  Medicine is not an Visual merchandiser. The only guarantee in medicine is that nothing is guaranteed. It is important to note that the decision to proceed with this intervention was based on the information collected from the patient. The Data and conclusions were drawn from the patient's questionnaire, the interview, and the physical examination. Because the information was provided in large part by the patient, it cannot be guaranteed that it has not been purposely or unconsciously manipulated. Every effort has been made to obtain as much relevant data as possible for this evaluation. It is important to note that the conclusions that lead to this procedure are derived in large part from the available data. Always take into account that the treatment will also be dependent on availability of resources and existing treatment guidelines, considered by other Pain Management Practitioners as being common knowledge and practice, at the time of the intervention. For Medico-Legal purposes, it is also important to point out that variation in procedural techniques and pharmacological choices  are the acceptable norm. The indications, contraindications, technique, and results of the above procedure should only be interpreted and judged by a Board-Certified Interventional Pain Specialist with extensive familiarity and expertise in the same exact procedure and technique.

## 2017-10-25 NOTE — Addendum Note (Signed)
Addended by: Valerie SaltsICE, Danaly Bari M on: 10/25/2017 11:57 AM   Modules accepted: Orders

## 2017-10-25 NOTE — Patient Instructions (Signed)

## 2017-10-26 ENCOUNTER — Telehealth: Payer: Self-pay

## 2017-10-26 NOTE — Telephone Encounter (Signed)
Post procedure phone call.  Patient states she is doing well.  

## 2017-11-06 NOTE — Progress Notes (Deleted)
Patient's Name: Jennifer Rivers  MRN: [id number]  Referring Provider: Kirk Ruths, MD  DOB: 1946/02/22  PCP: Kirk Ruths, MD  DOS: 11/07/2017  Note by: Gaspar Cola, MD  Service setting: Ambulatory outpatient  Specialty: Interventional Pain Management  Location: [facility] (AMB) Pain Management Facility    Patient type: Established   Primary Reason(s) for Visit: Encounter for post-procedure evaluation of chronic illness with mild to moderate exacerbation CC: No chief complaint on file.  HPI  Jennifer Rivers is a 72 y.o. year old, female patient, who comes today for a post-procedure evaluation. She has Adult idiopathic generalized osteoporosis; Atypical migraine; COPD (chronic obstructive pulmonary disease) ([place]); Generalized OA; HTN, goal below 140/90; Current tear of meniscus; Depression, major, in remission ([place]); Chronic knee pain (Primary Source of Pain) (Bilateral) (R>L); Long term current use of opiate analgesic; Long term prescription opiate use; Opiate use (20 MME/Day); Encounter for pain management planning; Encounter for therapeutic drug level monitoring; Chronic low back pain (Secondary source of pain) (Bilateral) (L>R); Tricompartmental knee arthropathy (Bilateral); Chronic upper back pain (between shoulder blades) (Bilateral) (R>L); Chronic shoulder pain [facility] source of pain) (Bilateral) (L>R); 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); Chronic lower extremity pain (Bilateral) (R>L); Chronic lower extremity radicular pain (Bilateral) (R>L); MRSA (methicillin resistant staph aureus) culture positive; Radicular pain of shoulder; Chronic cervical radicular pain (C4/C5 dermatomes) (L); Elevated sed rate; Elevated C-reactive protein (CRP); Chronic pain syndrome; Osteoarthritis of knees (Bilateral); Tear of meniscus of knee; Hip pain, chronic, unspecified laterality (B) (R>L); Primary osteoarthritis of hips, bilateral; Spondylosis of lumbar spine; Lumbosacral facet joint syndrome  ([place]); Chronic Neurogenic pain; Hyponatremia; Health care maintenance; 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 Her primarily concern today is the No chief complaint on file.  Pain Assessment: Location:     Radiating:   Onset:   Duration:   Quality:   Severity:  /10 (subjective, self-reported pain score)  Note: Reported level is compatible with observation.                         When using our objective Pain Scale, levels between 6 and 10/10 are said to belong in an emergency room, as it progressively worsens from a 6/10, described as severely limiting, requiring emergency care not usually available at an outpatient pain management facility. At a 6/10 level, communication becomes difficult and requires great effort. Assistance to reach the emergency department may be required. Facial flushing and profuse sweating along with potentially dangerous increases in heart rate and blood pressure will be evident. Effect on ADL:   Timing:   Modifying factors:   BP:    HR:    Jennifer Rivers comes in today for post-procedure evaluation after the treatment done on 10/26/2017.  Further details on both, my assessment(s), as well as the proposed treatment plan, please see below.  Post-Procedure Assessment  10/25/2017 Procedure: Diagnostic bilateral intra-articular knee joint injection #1 with local anesthetic and steroid, without fluoroscopy or IV sedation Pre-procedure pain score:  5/10 Post-procedure pain score: 0/10 (100% relief) (right 3  left 0)/10 Influential Factors: BMI:   Intra-procedural challenges: None observed.         Assessment challenges: None detected.              Reported side-effects: None.        Post-procedural adverse reactions or complications: None reported  Sedation: No sedation used. When no sedatives are used, the  analgesic levels obtained are directly associated to the effectiveness of the local anesthetics. However, when sedation is provided, the level of analgesia obtained during the initial 1 hour following the intervention, is believed to be the result of a combination of factors. These factors may include, but are not limited to: 1. The effectiveness of the local anesthetics used. 2. The effects of the analgesic(s) and/or anxiolytic(s) used. 3. The degree of discomfort experienced by the patient at the time of the procedure. 4. The patients ability and reliability in recalling and recording the events. 5. The presence and influence of possible secondary gains and/or psychosocial factors. Reported result: Relief experienced during the 1st hour after the procedure:   (Ultra-Short Term Relief)            Interpretative annotation: Clinically appropriate result. No IV Analgesic or Anxiolytic given, therefore benefits are completely due to Local Anesthetic effects.          Effects of local anesthetic: The analgesic effects attained during this period are directly associated to the localized infiltration of local anesthetics and therefore cary significant diagnostic value as to the etiological location, or anatomical origin, of the pain. Expected duration of relief is directly dependent on the pharmacodynamics of the local anesthetic used. Long-acting (4-6 hours) anesthetics used.  Reported result: Relief during the next 4 to 6 hour after the procedure:   (Short-Term Relief)            Interpretative annotation: Clinically appropriate result. Analgesia during this period is likely to be Local Anesthetic-related.          Long-term benefit: Defined as the period of time past the expected duration of local anesthetics (1 hour for short-acting and 4-6 hours for long-acting). With the possible exception of prolonged sympathetic blockade from the local anesthetics, benefits during this period are typically attributed  to, or associated with, other factors such as analgesic sensory neuropraxia, antiinflammatory effects, or beneficial biochemical changes provided by agents other than the local anesthetics.  Reported result: Extended relief following procedure:   (Long-Term Relief)            Interpretative annotation: Clinically appropriate result. Good relief. No permanent benefit expected. Inflammation plays a part in the etiology to the pain.          Current benefits: Defined as reported results that persistent at this point in time.   Analgesia: *** %            Function: Somewhat improved ROM: Somewhat improved Interpretative annotation: Recurrence of symptoms. No permanent benefit expected. Effective diagnostic intervention.          Interpretation: Results would suggest a successful diagnostic intervention.                  Plan:  Re-structuring of treatment plan. Possible series of 5 intra-articular Hyalgan knee injections.          Laboratory Chemistry  Inflammation Markers (CRP: Acute Phase) (ESR: Chronic Phase) Lab Results  Component Value Date   CRP 3.0 (H) 12/28/2015   ESRSEDRATE 55 (H) 12/28/2015                         Renal Markers Lab Results  Component Value Date   BUN 15 04/18/2017   CREATININE 0.79 04/18/2017   GFRAA >60 04/18/2017   GFRNONAA >60 04/18/2017  Hepatic Markers Lab Results  Component Value Date   AST 24 12/28/2015   ALT 17 12/28/2015   ALBUMIN 4.2 12/28/2015                        Neuropathy Markers Lab Results  Component Value Date   HGBA1C 5.6 03/11/2015                        Hematology Parameters Lab Results  Component Value Date   PLT 248 04/17/2017   HGB 10.2 (L) 04/17/2017   HCT 29.9 (L) 04/17/2017                        CV Markers Lab Results  Component Value Date   BNP 188.0 (H) 04/16/2017   TROPONINI <0.03 04/16/2017                         Note: Lab results reviewed.  Recent Diagnostic Imaging  Results  DG Chest 2 View CLINICAL DATA:  Asthma.  Wheezing and coughing.  EXAM: CHEST - 2 VIEW  COMPARISON:  April 16, 2017  FINDINGS: Scarring in the lateral right lung base. The heart, hila, mediastinum, lungs, and pleura otherwise demonstrate no acute abnormality or change.  IMPRESSION: No active cardiopulmonary disease.  Electronically Signed   By: Dorise Bullion III M.D   On: 09/15/2017 13:26  Complexity Note: I personally reviewed the fluoroscopic imaging of the procedure.                        Meds   Current Outpatient Medications:  .  albuterol (VENTOLIN HFA) 108 (90 BASE) MCG/ACT inhaler, Take 2 puffs by mouth every 6 (six) hours as needed., Disp: , Rfl:  .  benzonatate (TESSALON) 100 MG capsule, Take 1 capsule (100 mg total) by mouth 3 (three) times daily., Disp: 20 capsule, Rfl: 0 .  furosemide (LASIX) 20 MG tablet, , Disp: , Rfl:  .  losartan (COZAAR) 100 MG tablet, , Disp: , Rfl:  .  omeprazole (PRILOSEC) 20 MG capsule, Take 20 mg by mouth daily. , Disp: , Rfl:  .  [START ON 12/07/2017] oxyCODONE (OXY IR/ROXICODONE) 5 MG immediate release tablet, Take 1 tablet (5 mg total) by mouth every 6 (six) hours as needed for severe pain., Disp: 120 tablet, Rfl: 0 .  oxyCODONE (OXY IR/ROXICODONE) 5 MG immediate release tablet, Take 1 tablet (5 mg total) by mouth every 6 (six) hours as needed for severe pain., Disp: 120 tablet, Rfl: 0 .  oxyCODONE (OXY IR/ROXICODONE) 5 MG immediate release tablet, Take 1 tablet (5 mg total) by mouth every 6 (six) hours as needed for severe pain., Disp: 120 tablet, Rfl: 0 .  pregabalin (LYRICA) 50 MG capsule, Take 1 capsule (50 mg total) by mouth 3 (three) times daily., Disp: 90 capsule, Rfl: 2  ROS  Constitutional: Denies any fever or chills Gastrointestinal: No reported hemesis, hematochezia, vomiting, or acute GI distress Musculoskeletal: Denies any acute onset joint swelling, redness, loss of ROM, or weakness Neurological: No reported  episodes of acute onset apraxia, aphasia, dysarthria, agnosia, amnesia, paralysis, loss of coordination, or loss of consciousness  Allergies  Ms. Adcox is allergic to bupropion; codeine; gabapentin; tramadol; penicillins; and sulfa antibiotics.  [place]  Drug: Ms. Henzler  reports that she does not use drugs. Alcohol:  reports that she does not drink  alcohol. Tobacco:  reports that she quit smoking about 2 months ago. She has never used smokeless tobacco. Medical:  has a past medical history of Asthma, GERD (gastroesophageal reflux disease), Hypertension, MRSA (methicillin resistant staph aureus) culture positive (08/2015), Multilevel degenerative disc disease, Osteoarthritis, and Osteoporosis. Surgical: Ms. Koelzer  has a past surgical history that includes Abdominal hysterectomy; Tonsillectomy; and Knee arthroscopy (Right, 09/15/2015). Family: family history includes Diabetes in her mother; Heart attack in her father.  Constitutional Exam  General appearance: Well nourished, well developed, and well hydrated. In no apparent acute distress There were no vitals filed for this visit. BMI Assessment: Estimated body mass index is 28.35 kg/m as calculated from the following:   Height as of 10/25/17: 5' 2"  (1.575 m).   Weight as of 10/25/17: 155 lb (70.3 kg).  BMI interpretation table: BMI level Category Range association with higher incidence of chronic pain  <18 kg/m2 Underweight   18.5-24.9 kg/m2 Ideal body weight   25-29.9 kg/m2 Overweight Increased incidence by 20%  30-34.9 kg/m2 Obese (Class I) Increased incidence by 68%  35-39.9 kg/m2 Severe obesity (Class II) Increased incidence by 136%  >40 kg/m2 Extreme obesity (Class III) Increased incidence by 254%   Patient's current BMI Ideal Body weight  There is no height or weight on file to calculate BMI. Ideal body weight: 50.1 kg (110 lb 7.2 oz) Adjusted ideal body weight: 58.2 kg (128 lb 4.3 oz)   BMI Readings from Last 4 Encounters:   10/25/17 28.35 kg/m  10/04/17 28.35 kg/m  09/15/17 28.35 kg/m  07/10/17 29.26 kg/m   Wt Readings from Last 4 Encounters:  10/25/17 155 lb (70.3 kg)  10/04/17 155 lb (70.3 kg)  09/15/17 155 lb (70.3 kg)  07/10/17 160 lb (72.6 kg)  Psych/Mental status: Alert, oriented x 3 (person, place, & time)       Eyes: PERLA Respiratory: No evidence of acute respiratory distress  Cervical Spine Area Exam  Skin & Axial Inspection: No masses, redness, edema, swelling, or associated skin lesions Alignment: Symmetrical Functional ROM: Unrestricted ROM      Stability: No instability detected Muscle Tone/Strength: Functionally intact. No obvious neuro-muscular anomalies detected. Sensory (Neurological): Unimpaired Palpation: No palpable anomalies              Upper Extremity (UE) Exam    Side: Right upper extremity  Side: Left upper extremity  Skin & Extremity Inspection: Skin color, temperature, and hair growth are WNL. No peripheral edema or cyanosis. No masses, redness, swelling, asymmetry, or associated skin lesions. No contractures.  Skin & Extremity Inspection: Skin color, temperature, and hair growth are WNL. No peripheral edema or cyanosis. No masses, redness, swelling, asymmetry, or associated skin lesions. No contractures.  Functional ROM: Unrestricted ROM          Functional ROM: Unrestricted ROM          Muscle Tone/Strength: Functionally intact. No obvious neuro-muscular anomalies detected.  Muscle Tone/Strength: Functionally intact. No obvious neuro-muscular anomalies detected.  Sensory (Neurological): Unimpaired          Sensory (Neurological): Unimpaired          Palpation: No palpable anomalies              Palpation: No palpable anomalies              Provocative Test(s):  Phalen's test: deferred Tinel's test: deferred Apley's scratch test (touch opposite shoulder):  Action 1 (Across chest): deferred Action 2 (Overhead): deferred Action 3 (LB reach): deferred  Provocative  Test(s):  Phalen's test: deferred Tinel's test: deferred Apley's scratch test (touch opposite shoulder):  Action 1 (Across chest): deferred Action 2 (Overhead): deferred Action 3 (LB reach): deferred    Thoracic Spine Area Exam  Skin & Axial Inspection: No masses, redness, or swelling Alignment: Symmetrical Functional ROM: Unrestricted ROM Stability: No instability detected Muscle Tone/Strength: Functionally intact. No obvious neuro-muscular anomalies detected. Sensory (Neurological): Unimpaired Muscle strength & Tone: No palpable anomalies  Lumbar Spine Area Exam  Skin & Axial Inspection: No masses, redness, or swelling Alignment: Symmetrical Functional ROM: Unrestricted ROM       Stability: No instability detected Muscle Tone/Strength: Functionally intact. No obvious neuro-muscular anomalies detected. Sensory (Neurological): Unimpaired Palpation: No palpable anomalies       Provocative Tests: Lumbar Hyperextension/rotation test: deferred today       Lumbar quadrant test (Kemp's test): deferred today       Lumbar Lateral bending test: deferred today       Patrick's Maneuver: deferred today                   FABER test: deferred today       Thigh-thrust test: deferred today       S-I compression test: deferred today       S-I distraction test: deferred today        Gait & Posture Assessment  Ambulation: Unassisted Gait: Relatively normal for age and body habitus Posture: WNL   Lower Extremity Exam    Side: Right lower extremity  Side: Left lower extremity  Stability: No instability observed          Stability: No instability observed          Skin & Extremity Inspection: Skin color, temperature, and hair growth are WNL. No peripheral edema or cyanosis. No masses, redness, swelling, asymmetry, or associated skin lesions. No contractures.  Skin & Extremity Inspection: Skin color, temperature, and hair growth are WNL. No peripheral edema or cyanosis. No masses, redness,  swelling, asymmetry, or associated skin lesions. No contractures.  Functional ROM: Diminished ROM for knee joint          Functional ROM: Diminished ROM for knee joint          Muscle Tone/Strength: Functionally intact. No obvious neuro-muscular anomalies detected.  Muscle Tone/Strength: Functionally intact. No obvious neuro-muscular anomalies detected.  Sensory (Neurological): Unimpaired  Sensory (Neurological): Unimpaired  Palpation: No palpable anomalies  Palpation: No palpable anomalies   Assessment  Primary Diagnosis & Pertinent Problem List: The primary encounter diagnosis was Chronic knee pain (Primary Source of Pain) (Bilateral) (R>L). Diagnoses of Osteoarthritis of knees (Bilateral), Chronic low back pain (Secondary source of pain) (Bilateral) (L>R), Chronic shoulder pain (Tertiary source of pain) (Bilateral) (L>R), MRSA (methicillin resistant staph aureus) culture positive, Disorder of skeletal system, Pharmacologic therapy, Problems influencing health status, Elevated C-reactive protein (CRP), and Elevated sed rate were also pertinent to this visit.  Status Diagnosis  Controlled Controlled Controlled 1. Chronic knee pain (Primary Source of Pain) (Bilateral) (R>L)   2. Osteoarthritis of knees (Bilateral)   3. Chronic low back pain (Secondary source of pain) (Bilateral) (L>R)   4. Chronic shoulder pain (Tertiary source of pain) (Bilateral) (L>R)   5. MRSA (methicillin resistant staph aureus) culture positive   6. Disorder of skeletal system   7. Pharmacologic therapy   8. Problems influencing health status   9. Elevated C-reactive protein (CRP)   10. Elevated sed rate     Problems updated and  reviewed during this visit: Problem  Disorder of Skeletal System  Pharmacologic Therapy  Problems Influencing Health Status   Plan of Care  Pharmacotherapy (Medications Ordered): No orders of the defined types were placed in this encounter.  Medications administered today: Sherrye Puga had no medications administered during this visit.  Procedure Orders    No procedure(s) ordered today   Lab Orders  No laboratory test(s) ordered today   Imaging Orders  No imaging studies ordered today   Referral Orders  No referral(s) requested today    Interventional management options: Planned, scheduled, and/or pending:   Therapeutic bilateral intra-articular Hyalgan knee injection #1    Considering:   Diagnostic/therapeutic Left cervical epidural steroid injection #2  Diagnostic bilateral intra-articular knee injection (L+S) (done on 10/25/2017) Possible series of 5 bilateral Hyalgan intra-articular knee injections  Diagnostic bilateral genicular nerve blocks  Possible bilateral genicular nerve RFA  Diagnostic bilateral lumbar facet block   Possible bilateral lumbar facet RFA   Diagnostic bilateral intra-articular shoulder joint injection  Diagnostic bilateral suprascapular nerve block  Possible bilateral suprascapular nerve RFA  Diagnostic right-sided L3-4 interlaminar LESI  Diagnostic right-sided L5 transforaminal ESI  Diagnostic left sided S1 transforaminal ESI  Diagnostic right-sided L4-5 lumbar ESI  Diagnostic right-sided L4 transforaminal ESI    Palliative PRN treatment(s):   Palliative bilateral intra-articular knee injection with local anesthetic and steroid (done on 10/25/2017) Palliative Left cervical epidural steroid injection #2    Provider-requested follow-up: No follow-ups on file.  Future Appointments  Date Time Provider [place]  11/07/2017  1:30 PM Milinda Pointer, MD [facility] None  01/01/2018  9:30 AM Vevelyn Francois, NP [facility] None   Primary Care Physician: Kirk Ruths, MD Location: [facility] Outpatient Pain Management Facility Note by: Gaspar Cola, MD Date: 11/07/2017; Time: 9:30 AM

## 2017-11-07 ENCOUNTER — Ambulatory Visit: Payer: Medicare HMO | Attending: Pain Medicine | Admitting: Pain Medicine

## 2017-11-07 DIAGNOSIS — Z789 Other specified health status: Secondary | ICD-10-CM | POA: Insufficient documentation

## 2017-11-07 DIAGNOSIS — Z79899 Other long term (current) drug therapy: Secondary | ICD-10-CM | POA: Insufficient documentation

## 2017-11-07 DIAGNOSIS — M899 Disorder of bone, unspecified: Secondary | ICD-10-CM | POA: Insufficient documentation

## 2017-12-03 ENCOUNTER — Encounter: Payer: Self-pay | Admitting: *Deleted

## 2017-12-03 ENCOUNTER — Other Ambulatory Visit: Payer: Self-pay

## 2017-12-04 NOTE — Discharge Instructions (Signed)

## 2017-12-10 ENCOUNTER — Ambulatory Visit
Admission: RE | Admit: 2017-12-10 | Discharge: 2017-12-10 | Disposition: A | Discharge status: Home / Self Care | Payer: Medicare HMO | Source: Ambulatory Visit | Attending: Ophthalmology | Admitting: Ophthalmology

## 2017-12-10 ENCOUNTER — Ambulatory Visit: Payer: Medicare HMO | Admitting: Anesthesiology

## 2017-12-10 ENCOUNTER — Encounter
Admission: RE | Disposition: A | Discharge status: Home / Self Care | Payer: Self-pay | Source: Ambulatory Visit | Attending: Ophthalmology

## 2017-12-10 DIAGNOSIS — Z87891 Personal history of nicotine dependence: Secondary | ICD-10-CM | POA: Diagnosis not present

## 2017-12-10 DIAGNOSIS — Z888 Allergy status to other drugs, medicaments and biological substances status: Secondary | ICD-10-CM | POA: Insufficient documentation

## 2017-12-10 DIAGNOSIS — H2512 Age-related nuclear cataract, left eye: Secondary | ICD-10-CM | POA: Insufficient documentation

## 2017-12-10 DIAGNOSIS — Z880 Allergy status to penicillin: Secondary | ICD-10-CM | POA: Insufficient documentation

## 2017-12-10 DIAGNOSIS — Z885 Allergy status to narcotic agent status: Secondary | ICD-10-CM | POA: Insufficient documentation

## 2017-12-10 DIAGNOSIS — J449 Chronic obstructive pulmonary disease, unspecified: Secondary | ICD-10-CM | POA: Insufficient documentation

## 2017-12-10 DIAGNOSIS — K219 Gastro-esophageal reflux disease without esophagitis: Secondary | ICD-10-CM | POA: Diagnosis not present

## 2017-12-10 DIAGNOSIS — M1990 Unspecified osteoarthritis, unspecified site: Secondary | ICD-10-CM | POA: Insufficient documentation

## 2017-12-10 DIAGNOSIS — Z882 Allergy status to sulfonamides status: Secondary | ICD-10-CM | POA: Insufficient documentation

## 2017-12-10 DIAGNOSIS — M810 Age-related osteoporosis without current pathological fracture: Secondary | ICD-10-CM | POA: Diagnosis not present

## 2017-12-10 DIAGNOSIS — I10 Essential (primary) hypertension: Secondary | ICD-10-CM | POA: Insufficient documentation

## 2017-12-10 DIAGNOSIS — Z79899 Other long term (current) drug therapy: Secondary | ICD-10-CM | POA: Diagnosis not present

## 2017-12-10 DIAGNOSIS — F329 Major depressive disorder, single episode, unspecified: Secondary | ICD-10-CM | POA: Insufficient documentation

## 2017-12-10 DIAGNOSIS — E7800 Pure hypercholesterolemia, unspecified: Secondary | ICD-10-CM | POA: Insufficient documentation

## 2017-12-10 HISTORY — PX: CATARACT EXTRACTION W/PHACO: SHX586

## 2017-12-10 HISTORY — DX: Motion sickness, initial encounter: T75.3XXA

## 2017-12-10 SURGERY — PHACOEMULSIFICATION, CATARACT, WITH IOL INSERTION
Anesthesia: Monitor Anesthesia Care | Laterality: Left

## 2017-12-10 MED ORDER — LIDOCAINE HCL (PF) 2 % IJ SOLN
INTRAOCULAR | Status: DC | PRN
  Administered 2017-12-10: 1 mL via INTRAOCULAR

## 2017-12-10 MED ORDER — ACETAMINOPHEN 160 MG/5ML PO SOLN: 325.0000 mg | ORAL | Status: DC | PRN

## 2017-12-10 MED ORDER — MOXIFLOXACIN HCL 0.5 % OP SOLN
OPHTHALMIC | Status: DC | PRN
  Administered 2017-12-10: 0.2 mL via OPHTHALMIC

## 2017-12-10 MED ORDER — ACETAMINOPHEN 325 MG PO TABS: 325.0000 mg | ORAL_TABLET | ORAL | Status: DC | PRN

## 2017-12-10 MED ORDER — MIDAZOLAM HCL 2 MG/2ML IJ SOLN
INTRAMUSCULAR | Status: DC | PRN
  Administered 2017-12-10 (×2): 1 mg via INTRAVENOUS

## 2017-12-10 MED ORDER — TETRACAINE HCL 0.5 % OP SOLN
1.0000 [drp] | OPHTHALMIC | Status: DC | PRN
  Administered 2017-12-10 (×2): 1 [drp] via OPHTHALMIC

## 2017-12-10 MED ORDER — SODIUM HYALURONATE 10 MG/ML IO SOLN
INTRAOCULAR | Status: DC | PRN
  Administered 2017-12-10: 0.55 mL via INTRAOCULAR

## 2017-12-10 MED ORDER — PHENYLEPHRINE HCL 10 % OP SOLN
1.0000 [drp] | OPHTHALMIC | Status: DC | PRN
  Administered 2017-12-10 (×3): 1 [drp] via OPHTHALMIC

## 2017-12-10 MED ORDER — SODIUM HYALURONATE 23 MG/ML IO SOLN
INTRAOCULAR | Status: DC | PRN
  Administered 2017-12-10: 0.6 mL via INTRAOCULAR

## 2017-12-10 MED ORDER — FENTANYL CITRATE (PF) 100 MCG/2ML IJ SOLN
INTRAMUSCULAR | Status: DC | PRN
  Administered 2017-12-10 (×2): 50 ug via INTRAVENOUS

## 2017-12-10 MED ORDER — LACTATED RINGERS IV SOLN: INTRAVENOUS | Status: DC

## 2017-12-10 MED ORDER — LACTATED RINGERS IV SOLN: 500.0000 mL | INTRAVENOUS | Status: DC

## 2017-12-10 MED ORDER — EPINEPHRINE PF 1 MG/ML IJ SOLN
INTRAOCULAR | Status: DC | PRN
  Administered 2017-12-10: 103 mL via OPHTHALMIC

## 2017-12-10 MED ORDER — CYCLOPENTOLATE HCL 2 % OP SOLN
1.0000 [drp] | OPHTHALMIC | Status: DC | PRN
  Administered 2017-12-10 (×3): 1 [drp] via OPHTHALMIC

## 2017-12-10 MED ORDER — DEXAMETHASONE SODIUM PHOSPHATE 4 MG/ML IJ SOLN: 8.0000 mg | Freq: Once | INTRAMUSCULAR | Status: DC | PRN

## 2017-12-10 SURGICAL SUPPLY — 16 items
CANNULA ANT/CHMB 27GA (MISCELLANEOUS) ×3 IMPLANT
DISSECTOR HYDRO NUCLEUS 50X22 (MISCELLANEOUS) ×3 IMPLANT
GLOVE BIO SURGEON STRL SZ8 (GLOVE) ×3 IMPLANT
GLOVE SURG LX 7.5 STRW (GLOVE) ×2
GLOVE SURG LX STRL 7.5 STRW (GLOVE) ×1 IMPLANT
GOWN STRL REUS W/ TWL LRG LVL3 (GOWN DISPOSABLE) ×2 IMPLANT
GOWN STRL REUS W/TWL LRG LVL3 (GOWN DISPOSABLE) ×4
LENS IOL TECNIS ITEC 21.0 (Intraocular Lens) ×3 IMPLANT
MARKER SKIN DUAL TIP RULER LAB (MISCELLANEOUS) ×3 IMPLANT
PACK CATARACT (MISCELLANEOUS) ×3 IMPLANT
PACK DR. KING ARMS (PACKS) ×3 IMPLANT
PACK EYE AFTER SURG (MISCELLANEOUS) ×3 IMPLANT
SYR 3ML LL SCALE MARK (SYRINGE) ×3 IMPLANT
SYR TB 1ML LUER SLIP (SYRINGE) ×3 IMPLANT
WATER STERILE IRR 500ML POUR (IV SOLUTION) ×3 IMPLANT
WIPE NON LINTING 3.25X3.25 (MISCELLANEOUS) ×3 IMPLANT

## 2017-12-10 NOTE — Anesthesia Postprocedure Evaluation (Signed)
Anesthesia Post Note  Patient: Jennifer Rivers  Procedure(s) Performed: CATARACT EXTRACTION PHACO AND INTRAOCULAR LENS PLACEMENT (IOC)  LEFT (Left )  Patient location during evaluation: PACU Anesthesia Type: MAC Level of consciousness: awake and alert Pain management: pain level controlled Vital Signs Assessment: post-procedure vital signs reviewed and stable Respiratory status: spontaneous breathing, nonlabored ventilation, respiratory function stable and patient connected to nasal cannula oxygen Cardiovascular status: stable and blood pressure returned to baseline Postop Assessment: no apparent nausea or vomiting Anesthetic complications: no    Deklin Bieler D [place]

## 2017-12-10 NOTE — H&P (Signed)
The History and Physical notes are on paper, have been signed, and are to be scanned.   I have examined the patient and there are no changes to the H&P.   Willey BladeBradley Glee Lashomb 12/10/2017 10:43 AM

## 2017-12-10 NOTE — Transfer of Care (Signed)
Immediate Anesthesia Transfer of Care Note  Patient: Jennifer Rivers  Procedure(s) Performed: CATARACT EXTRACTION PHACO AND INTRAOCULAR LENS PLACEMENT (IOC)  LEFT (Left )  Patient Location: PACU  Anesthesia Type: MAC  Level of Consciousness: awake, alert  and patient cooperative  Airway and Oxygen Therapy: Patient Spontanous Breathing and Patient connected to supplemental oxygen  Post-op Assessment: Post-op Vital signs reviewed, Patient's Cardiovascular Status Stable, Respiratory Function Stable, Patent Airway and No signs of Nausea or vomiting  Post-op Vital Signs: Reviewed and stable  Complications: No apparent anesthesia complications

## 2017-12-10 NOTE — Anesthesia Preprocedure Evaluation (Addendum)
Anesthesia Evaluation  Patient identified by MRN, date of birth, ID band Patient awake    Reviewed: Allergy & Precautions, H&P , NPO status , Patient's Chart, lab work & pertinent test results, reviewed documented beta blocker date and time   Airway Mallampati: III  TM Distance: <3 FB Neck ROM: full    Dental no notable dental hx.    Pulmonary asthma , COPD,  COPD inhaler, former smoker,    Pulmonary exam normal breath sounds clear to auscultation       Cardiovascular Exercise Tolerance: Good hypertension, negative cardio ROS   Rhythm:regular Rate:Normal     Neuro/Psych  Headaches, PSYCHIATRIC DISORDERS Depression  Neuromuscular disease    GI/Hepatic Neg liver ROS, GERD  ,  Endo/Other  negative endocrine ROS  Renal/GU negative Renal ROS  negative genitourinary   Musculoskeletal   Abdominal   Peds  Hematology negative hematology ROS (+)   Anesthesia Other Findings   Reproductive/Obstetrics negative OB ROS                            Anesthesia Physical Anesthesia Plan  ASA: II  Anesthesia Plan: MAC   Post-op Pain Management:    Induction:   PONV Risk Score and Plan: 2  Airway Management Planned:   Additional Equipment:   Intra-op Plan:   Post-operative Plan:   Informed Consent: I have reviewed the patients History and Physical, chart, labs and discussed the procedure including the risks, benefits and alternatives for the proposed anesthesia with the patient or authorized representative who has indicated his/her understanding and acceptance.     Plan Discussed with: CRNA  Anesthesia Plan Comments:         Anesthesia Quick Evaluation

## 2017-12-10 NOTE — Anesthesia Procedure Notes (Signed)
Procedure Name: [place] Performed by: Cameron Ali, CRNA Pre-anesthesia Checklist: Patient identified, Emergency Drugs available, Suction available, Timeout performed and Patient being monitored Patient Re-evaluated:Patient Re-evaluated prior to induction Oxygen Delivery Method: Nasal cannula Placement Confirmation: positive ETCO2

## 2017-12-10 NOTE — Op Note (Signed)
OPERATIVE NOTE  Jennifer FosterDorothy A Wootan [id number] 12/10/2017   PREOPERATIVE DIAGNOSIS:  Nuclear sclerotic cataract left eye.  H25.12   POSTOPERATIVE DIAGNOSIS:    Nuclear sclerotic cataract left eye.     PROCEDURE:  Phacoemusification with posterior chamber intraocular lens placement of the left eye   LENS:   Implant Name Type Inv. Item Serial No. Manufacturer Lot No. LRB No. Used  LENS IOL DIOP 21.0 - [id number] Intraocular Lens LENS IOL DIOP 21.0 [id number]  Left 1       PCB00 +21.0   ULTRASOUND TIME: 0 minutes 38 seconds.  CDE 3.32   SURGEON:  Willey BladeBradley Brynnan Rodenbaugh, MD, MPH   ANESTHESIA:  Topical with tetracaine drops augmented with 1% preservative-free intracameral lidocaine.  ESTIMATED BLOOD LOSS: <1 mL   COMPLICATIONS:  None.   DESCRIPTION OF PROCEDURE:  The patient was identified in the holding room and transported to the operating room and placed in the supine position under the operating microscope.  The left eye was identified as the operative eye and it was prepped and draped in the usual sterile ophthalmic fashion.   A 1.0 millimeter clear-corneal paracentesis was made at the 5:00 position. 0.5 ml of preservative-free 1% lidocaine with epinephrine was injected into the anterior chamber.  The anterior chamber was filled with Healon 5 viscoelastic.  A 2.4 millimeter keratome was used to make a near-clear corneal incision at the 2:00 position.  A curvilinear capsulorrhexis was made with a cystotome and capsulorrhexis forceps.  Balanced salt solution was used to hydrodissect and hydrodelineate the nucleus.   Phacoemulsification was then used in stop and chop fashion to remove the lens nucleus and epinucleus.  The remaining cortex was then removed using the irrigation and aspiration handpiece. Healon was then placed into the capsular bag to distend it for lens placement.  A lens was then injected into the capsular bag.  The remaining viscoelastic was aspirated.   Wounds were hydrated  with balanced salt solution.  The anterior chamber was inflated to a physiologic pressure with balanced salt solution.  Intracameral vigamox 0.1 mL undiltued was injected into the eye and a drop placed onto the ocular surface.  No wound leaks were noted.  The patient was taken to the recovery room in stable condition without complications of anesthesia or surgery  Willey BladeBradley Meesha Sek 12/10/2017, 11:22 AM

## 2017-12-11 ENCOUNTER — Encounter: Payer: Self-pay | Admitting: Ophthalmology

## 2018-01-01 ENCOUNTER — Ambulatory Visit: Payer: Medicare HMO | Attending: Nurse Practitioner | Admitting: Nurse Practitioner

## 2018-01-01 ENCOUNTER — Encounter: Payer: Self-pay | Admitting: Nurse Practitioner

## 2018-01-01 ENCOUNTER — Other Ambulatory Visit: Payer: Self-pay

## 2018-01-01 VITALS — BP 160/84 | HR 64 | Temp 97.8°F | Ht 61.0 in | Wt 160.0 lb

## 2018-01-01 DIAGNOSIS — F419 Anxiety disorder, unspecified: Secondary | ICD-10-CM | POA: Insufficient documentation

## 2018-01-01 DIAGNOSIS — M79604 Pain in right leg: Secondary | ICD-10-CM

## 2018-01-01 DIAGNOSIS — M79605 Pain in left leg: Secondary | ICD-10-CM

## 2018-01-01 DIAGNOSIS — E871 Hypo-osmolality and hyponatremia: Secondary | ICD-10-CM | POA: Diagnosis not present

## 2018-01-01 DIAGNOSIS — I10 Essential (primary) hypertension: Secondary | ICD-10-CM | POA: Diagnosis not present

## 2018-01-01 DIAGNOSIS — E782 Mixed hyperlipidemia: Secondary | ICD-10-CM | POA: Insufficient documentation

## 2018-01-01 DIAGNOSIS — Z882 Allergy status to sulfonamides status: Secondary | ICD-10-CM | POA: Diagnosis not present

## 2018-01-01 DIAGNOSIS — K219 Gastro-esophageal reflux disease without esophagitis: Secondary | ICD-10-CM | POA: Insufficient documentation

## 2018-01-01 DIAGNOSIS — M47816 Spondylosis without myelopathy or radiculopathy, lumbar region: Secondary | ICD-10-CM | POA: Diagnosis not present

## 2018-01-01 DIAGNOSIS — F329 Major depressive disorder, single episode, unspecified: Secondary | ICD-10-CM | POA: Diagnosis not present

## 2018-01-01 DIAGNOSIS — G8929 Other chronic pain: Secondary | ICD-10-CM

## 2018-01-01 DIAGNOSIS — J449 Chronic obstructive pulmonary disease, unspecified: Secondary | ICD-10-CM | POA: Diagnosis not present

## 2018-01-01 DIAGNOSIS — G894 Chronic pain syndrome: Secondary | ICD-10-CM

## 2018-01-01 DIAGNOSIS — Z888 Allergy status to other drugs, medicaments and biological substances status: Secondary | ICD-10-CM | POA: Diagnosis not present

## 2018-01-01 DIAGNOSIS — Z885 Allergy status to narcotic agent status: Secondary | ICD-10-CM | POA: Diagnosis not present

## 2018-01-01 DIAGNOSIS — Z880 Allergy status to penicillin: Secondary | ICD-10-CM | POA: Diagnosis not present

## 2018-01-01 DIAGNOSIS — M170 Bilateral primary osteoarthritis of knee: Secondary | ICD-10-CM

## 2018-01-01 DIAGNOSIS — Z87891 Personal history of nicotine dependence: Secondary | ICD-10-CM | POA: Diagnosis not present

## 2018-01-01 DIAGNOSIS — M160 Bilateral primary osteoarthritis of hip: Secondary | ICD-10-CM | POA: Insufficient documentation

## 2018-01-01 DIAGNOSIS — Z5181 Encounter for therapeutic drug level monitoring: Secondary | ICD-10-CM | POA: Diagnosis present

## 2018-01-01 DIAGNOSIS — Z79899 Other long term (current) drug therapy: Secondary | ICD-10-CM | POA: Diagnosis not present

## 2018-01-01 MED ORDER — OXYCODONE HCL 5 MG PO TABS: 5.0000 mg | ORAL_TABLET | Freq: Four times a day (QID) | ORAL | 0 refills | Status: DC | PRN

## 2018-01-01 MED ORDER — PREGABALIN 50 MG PO CAPS: 50.0000 mg | ORAL_CAPSULE | Freq: Three times a day (TID) | ORAL | 2 refills | Status: DC

## 2018-01-01 NOTE — Progress Notes (Signed)
Patient's Name: Jennifer Rivers  MRN: [id number]  Referring Provider: Kirk Ruths, MD  DOB: November 18, 1945  PCP: Zeb Comfort, MD  DOS: 01/01/2018  Note by: Vevelyn Francois NP  Service setting: Ambulatory outpatient  Specialty: Interventional Pain Management  Location: [facility] (AMB) Pain Management Facility    Patient type: Established    Primary Reason(s) for Visit: Encounter for prescription drug management & post-procedure evaluation of chronic illness with mild to moderate exacerbation(Level of risk: moderate) CC: Back Pain  HPI  Ms. Pal is a 72 y.o. year old, female patient, who comes today for a post-procedure evaluation and medication management. She has Adult idiopathic generalized osteoporosis; Atypical migraine; COPD (chronic obstructive pulmonary disease) ([place]); Generalized OA; HTN, goal below 140/90; Current tear of meniscus; Major depressive disorder; Chronic knee pain (Primary Source of Pain) (Bilateral) (R>L); Long term current use of opiate analgesic; Long term prescription opiate use; Opiate use (20 MME/Day); Encounter for pain management planning; Encounter for therapeutic drug level monitoring; Chronic low back pain (Secondary source of pain) (Bilateral) (L>R); Tricompartmental knee arthropathy (Bilateral); Chronic upper back pain (between shoulder blades) (Bilateral) (R>L); Chronic shoulder pain [facility] source of pain) (Bilateral) (L>R); 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); Chronic lower extremity pain (Bilateral) (R>L); Chronic lower extremity radicular pain (Bilateral) (R>L); MRSA (methicillin resistant staph aureus) culture positive; Radicular pain of shoulder; Chronic cervical radicular pain (C4/C5 dermatomes) (L); Elevated sed rate; Elevated C-reactive protein (CRP); Chronic pain syndrome; Osteoarthritis of knees (Bilateral); Tear of meniscus of knee; Hip pain, chronic, unspecified laterality (B) (R>L); Primary osteoarthritis of hips, bilateral; Spondylosis of  lumbar spine; Lumbosacral facet joint syndrome ([place]); Chronic Neurogenic pain; Hyponatremia; Health care maintenance; Lumbar foraminal stenosis (L4-5) (Right); 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 Her primarily concern today is the Back Pain  Pain Assessment: Location: Right, Left Back(right hip) Radiating: Pain radiaties down to foot Onset: More than a month ago Duration: Chronic pain Quality: Constant, Discomfort, Aching Severity: 6 /10 (subjective, self-reported pain score)  Note: Reported level is compatible with observation. Clinically the patient looks like a 2/10 A 2/10 is viewed as "Mild to Moderate" and described as noticeable and distracting. Impossible to hide from other people. More frequent flare-ups. Still possible to adapt and function close to normal. It can be very annoying and may have occasional stronger flare-ups. With discipline, patients may get used to it and adapt. Information on the proper use of the pain scale provided to the patient today. When using our objective Pain Scale, levels between 6 and 10/10 are said to belong in an emergency room, as it progressively worsens from a 6/10, described as severely limiting, requiring emergency care not usually available at an outpatient pain management facility. At a 6/10 level, communication becomes difficult and requires great effort. Assistance to reach the emergency department may be required. Facial flushing and profuse sweating along with potentially dangerous increases in heart rate and blood pressure will be evident. Effect on ADL: limits daily activites Timing: Constant Modifying factors: ice and heat , lay down, medications BP: (!) 160/84  HR: 64  Ms. Shutters was last seen on 6/ 10/2017 for a procedure. During today's appointment we  reviewed Ms. Bahr's post-procedure results, as well as her outpatient medication regimen. She admits that she bent over and got some right hip pain that has moved to her back . She admits that the  pain did go down her legs. She admits that her pain is getting better. She has to be careful with movement.   Further details on both, my assessment(s), as well as the proposed treatment plan, please see below.  Controlled Substance Pharmacotherapy Assessment REMS (Risk Evaluation and Mitigation Strategy)  Analgesic:Oxycodone IR 5 mg 1 tablet by mouth every 6 hours (20 mg/dayof oxycodone) MME/day:36m/day BChauncey Fischer RN  01/01/2018  9:33 AM  Sign at close encounter Nursing Pain Medication Assessment:  Safety precautions to be maintained throughout the outpatient stay will include: orient to surroundings, keep bed in low position, maintain call bell within reach at all times, provide assistance with transfer out of bed and ambulation.  Medication Inspection Compliance: Pill count conducted under aseptic conditions, in front of the patient. Neither the pills nor the bottle was removed from the patient's sight at any time. Once count was completed pills were immediately returned to the patient in their original bottle.  Medication: Oxycodone IR Pill/Patch Count: 0 of 120 pills remain Pill/Patch Appearance: Markings consistent with prescribed medication Bottle Appearance: Standard pharmacy container. Clearly labeled. Filled Date: 7 / 175/ 2019 Last Medication intake:  Today   Pharmacokinetics: Liberation and absorption (onset of action): WNL Distribution (time to peak effect): WNL Metabolism and excretion (duration of action): WNL         Pharmacodynamics: Desired effects: Analgesia: Ms. RMerasreports >50% benefit. Functional ability: Patient reports that medication allows her to accomplish basic ADLs Clinically meaningful improvement in function (CMIF): Sustained CMIF goals  met Perceived effectiveness: Described as relatively effective, allowing for increase in activities of daily living (ADL) Undesirable effects: Side-effects or Adverse reactions: None reported Monitoring:  PMP: Online review of the past 162-montheriod conducted. Compliant with practice rules and regulations Last UDS on record: Summary  Date Value Ref Range Status  10/04/2017 FINAL  Final    Comment:    ==================================================================== TOXASSURE SELECT 13 (MW) ==================================================================== Test                             Result       Flag       Units Drug Present and Declared for Prescription Verification   Oxycodone                      315          EXPECTED   ng/mg creat   Oxymorphone                    274          EXPECTED   ng/mg creat   Noroxycodone                   598          EXPECTED   ng/mg creat    Sources of oxycodone include scheduled prescription medications.    Oxymorphone and noroxycodone are expected metabolites of    oxycodone. Oxymorphone is also available as a scheduled    prescription medication. ==================================================================== Test                      Result    Flag   Units      Ref Range   Creatinine              54  mg/dL      >=20 ==================================================================== Declared Medications:  The flagging and interpretation on this report are based on the  following declared medications.  Unexpected results may arise from  inaccuracies in the declared medications.  **Note: The testing scope of this panel includes these medications:  Oxycodone  **Note: The testing scope of this panel does not include following  reported medications:  Albuterol  Benzonatate  Furosemide  Losartan (Losartan Potassium)  Omeprazole  Pregabalin ==================================================================== For  clinical consultation, please call [phone]. ====================================================================    UDS interpretation: Compliant          Medication Assessment Form: Reviewed. Patient indicates being compliant with therapy Treatment compliance: Compliant Risk Assessment Profile: Aberrant behavior: lost, stolen or damaged medications Comorbid factors increasing risk of overdose: See prior notes. No additional risks detected today Risk of substance use disorder (SUD): Low-to-Moderate Opioid Risk Tool - 01/01/18 0946      Family History of Substance Abuse   Alcohol  Negative    Illegal Drugs  Negative    Rx Drugs  Negative      Personal History of Substance Abuse   Alcohol  Negative    Illegal Drugs  Negative    Rx Drugs  Negative      Total Score   Opioid Risk Tool Scoring  0    Opioid Risk Interpretation  Low Risk      ORT Scoring interpretation table:  Score <3 = Low Risk for SUD  Score between 4-7 = Moderate Risk for SUD  Score >8 = High Risk for Opioid Abuse   Risk Mitigation Strategies:  Patient Counseling: Covered Patient-Prescriber Agreement (PPA): Present and active  Notification to other healthcare providers: Done  Pharmacologic Plan: No change in therapy, at this time.             Post-Procedure Assessment   10/25/2017 Procedure:  Bilateral intra-articular knee injections Pre-procedure pain score:  5/10 Post-procedure pain score: 3 right knee 0 left knee/10         Influential Factors: BMI: 30.23 kg/m Intra-procedural challenges: None observed.         Assessment challenges: None detected.              Reported side-effects: None.        Post-procedural adverse reactions or complications: None reported         Sedation: Please see nurses note. When no sedatives are used, the analgesic levels obtained are directly associated to the effectiveness of the local anesthetics. However, when sedation is provided, the level of analgesia  obtained during the initial 1 hour following the intervention, is believed to be the result of a combination of factors. These factors may include, but are not limited to: 1. The effectiveness of the local anesthetics used. 2. The effects of the analgesic(s) and/or anxiolytic(s) used. 3. The degree of discomfort experienced by the patient at the time of the procedure. 4. The patients ability and reliability in recalling and recording the events. 5. The presence and influence of possible secondary gains and/or psychosocial factors. Reported result: Relief experienced during the 1st hour after the procedure: 100 % (Ultra-Short Term Relief)            Interpretative annotation: Clinically appropriate result. Analgesia during this period is likely to be Local Anesthetic and/or IV Sedative (Analgesic/Anxiolytic) related.          Effects of local anesthetic: The analgesic effects attained during this period are directly associated to the localized  infiltration of local anesthetics and therefore cary significant diagnostic value as to the etiological location, or anatomical origin, of the pain. Expected duration of relief is directly dependent on the pharmacodynamics of the local anesthetic used. Long-acting (4-6 hours) anesthetics used.  Reported result: Relief during the next 4 to 6 hour after the procedure: 100 %(100 for 3-4 weeks) (Short-Term Relief)            Interpretative annotation: Clinically appropriate result. Analgesia during this period is likely to be Local Anesthetic-related.          Long-term benefit: Defined as the period of time past the expected duration of local anesthetics (1 hour for short-acting and 4-6 hours for long-acting). With the possible exception of prolonged sympathetic blockade from the local anesthetics, benefits during this period are typically attributed to, or associated with, other factors such as analgesic sensory neuropraxia, antiinflammatory effects, or beneficial  biochemical changes provided by agents other than the local anesthetics.  Reported result: Extended relief following procedure: 70 % (Long-Term Relief)            Interpretative annotation: Clinically possible results. Good relief. No permanent benefit expected. Inflammation plays a part in the etiology to the pain.          Current benefits: Defined as reported results that persistent at this point in time.   Analgesia: >50 %            Function: Ms. Mandujano reports improvement in function ROM: Ms. Poulton reports improvement in ROM Interpretative annotation: Good relief.                Interpretation: Results would suggest a successful diagnostic intervention.                  Plan:  Please see "Plan of Care" for details.                Laboratory Chemistry  Inflammation Markers (CRP: Acute Phase) (ESR: Chronic Phase) Lab Results  Component Value Date   CRP 3.0 (H) 12/28/2015   ESRSEDRATE 55 (H) 12/28/2015                         Rheumatology Markers Lab Results  Component Value Date   RF 14.6 (H) 02/03/2016                        Renal Function Markers Lab Results  Component Value Date   BUN 15 04/18/2017   CREATININE 0.79 04/18/2017   GFRAA >60 04/18/2017   GFRNONAA >60 04/18/2017                             Hepatic Function Markers Lab Results  Component Value Date   AST 24 12/28/2015   ALT 17 12/28/2015   ALBUMIN 4.2 12/28/2015   ALKPHOS 115 12/28/2015   LIPASE 33 03/10/2015                        Electrolytes Lab Results  Component Value Date   NA 129 (L) 04/18/2017   K 3.8 04/18/2017   CL 94 (L) 04/18/2017   CALCIUM 8.8 (L) 04/18/2017   MG 1.6 (L) 04/17/2017                        Neuropathy Markers Lab Results  Component Value Date  VITAMINB12 246 04/16/2017   FOLATE 8.7 04/16/2017   HGBA1C 5.6 03/11/2015                        Bone Pathology Markers Lab Results  Component Value Date   25OHVITD1 24 (L) 12/28/2015   25OHVITD2 2.5 12/28/2015    25OHVITD3 21 12/28/2015                         Coagulation Parameters Lab Results  Component Value Date   PLT 248 04/17/2017                        Cardiovascular Markers Lab Results  Component Value Date   BNP 188.0 (H) 04/16/2017   TROPONINI <0.03 04/16/2017   HGB 10.2 (L) 04/17/2017   HCT 29.9 (L) 04/17/2017                         CA Markers No results found for: CEA, CA125, LABCA2                      Note: Lab results reviewed.  Recent Diagnostic Imaging Results  DG Chest 2 View CLINICAL DATA:  Asthma.  Wheezing and coughing.  EXAM: CHEST - 2 VIEW  COMPARISON:  April 16, 2017  FINDINGS: Scarring in the lateral right lung base. The heart, hila, mediastinum, lungs, and pleura otherwise demonstrate no acute abnormality or change.  IMPRESSION: No active cardiopulmonary disease.  Electronically Signed   By: Dorise Bullion III M.D   On: 09/15/2017 13:26  Complexity Note: Imaging results reviewed. Results shared with Ms. Stann Mainland, using Layman's terms.                         Meds   Current Outpatient Medications:  .  albuterol (VENTOLIN HFA) 108 (90 BASE) MCG/ACT inhaler, Take 2 puffs by mouth every 6 (six) hours as needed., Disp: , Rfl:  .  atorvastatin (LIPITOR) 10 MG tablet, Take 10 mg by mouth daily., Disp: , Rfl:  .  carvedilol (COREG) 25 MG tablet, Take 25 mg by mouth 2 (two) times daily with a meal., Disp: , Rfl:  .  Fluticasone-Salmeterol (ADVAIR DISKUS IN), Inhale into the lungs., Disp: , Rfl:  .  furosemide (LASIX) 20 MG tablet, , Disp: , Rfl:  .  hydrochlorothiazide (MICROZIDE) 12.5 MG capsule, Take 12.5 mg by mouth daily., Disp: , Rfl:  .  omeprazole (PRILOSEC) 20 MG capsule, Take 20 mg by mouth daily. , Disp: , Rfl:  .  [START ON 03/07/2018] oxyCODONE (OXY IR/ROXICODONE) 5 MG immediate release tablet, Take 1 tablet (5 mg total) by mouth every 6 (six) hours as needed for severe pain., Disp: 120 tablet, Rfl: 0 .  [START ON 01/06/2018]  pregabalin (LYRICA) 50 MG capsule, Take 1 capsule (50 mg total) by mouth 3 (three) times daily., Disp: 90 capsule, Rfl: 2 .  [START ON 02/05/2018] oxyCODONE (OXY IR/ROXICODONE) 5 MG immediate release tablet, Take 1 tablet (5 mg total) by mouth every 6 (six) hours as needed for severe pain., Disp: 120 tablet, Rfl: 0 .  [START ON 01/06/2018] oxyCODONE (OXY IR/ROXICODONE) 5 MG immediate release tablet, Take 1 tablet (5 mg total) by mouth every 6 (six) hours as needed for severe pain., Disp: 120 tablet, Rfl: 0  ROS  Constitutional: Denies any fever or chills Gastrointestinal:  No reported hemesis, hematochezia, vomiting, or acute GI distress Musculoskeletal: Denies any acute onset joint swelling, redness, loss of ROM, or weakness Neurological: No reported episodes of acute onset apraxia, aphasia, dysarthria, agnosia, amnesia, paralysis, loss of coordination, or loss of consciousness  Allergies  Ms. Callegari is allergic to bupropion; codeine; gabapentin; tramadol; penicillins; and sulfa antibiotics.  [place]  Drug: Ms. Wan  reports that she does not use drugs. Alcohol:  reports that she does not drink alcohol. Tobacco:  reports that she quit smoking about 3 months ago. Her smoking use included cigarettes. She has a 2.50 pack-year smoking history. She has never used smokeless tobacco. Medical:  has a past medical history of Asthma, GERD (gastroesophageal reflux disease), Hypertension, Motion sickness, MRSA (methicillin resistant staph aureus) culture positive (08/2015), Multilevel degenerative disc disease, Osteoarthritis, and Osteoporosis. Surgical: Ms. Ney  has a past surgical history that includes Abdominal hysterectomy; Tonsillectomy; Knee arthroscopy (Right, 09/15/2015); and Cataract extraction w/PHACO (Left, 12/10/2017). Family: family history includes Diabetes in her mother; Heart attack in her father.  Constitutional Exam  General appearance: Well nourished, well developed, and well hydrated. In  no apparent acute distress Vitals:   01/01/18 0931  BP: (!) 160/84  Pulse: 64  Temp: 97.8 F (36.6 C)  SpO2: 100%  Weight: 160 lb (72.6 kg)  Height: 5' 1"  (1.549 m)  Psych/Mental status: Alert, oriented x 3 (person, place, & time)       Eyes: PERLA Respiratory: No evidence of acute respiratory distress  Lumbar Spine Area Exam  Skin & Axial Inspection: No masses, redness, or swelling Alignment: Symmetrical Functional ROM: Unrestricted ROM       Stability: No instability detected Muscle Tone/Strength: Functionally intact. No obvious neuro-muscular anomalies detected. Sensory (Neurological): Unimpaired Palpation: Complains of area being tender to palpation       Provocative Tests: Hyperextension/rotation test: Positive bilaterally for facet joint pain. Lumbar quadrant test (Kemp's test): deferred today       Lateral bending test: deferred today       Patrick's Maneuver: deferred today                     Gait & Posture Assessment  Ambulation: Unassisted Gait: Relatively normal for age and body habitus Posture: WNL   Lower Extremity Exam    Side: Right lower extremity  Side: Left lower extremity  Stability: No instability observed          Stability: No instability observed          Skin & Extremity Inspection: Edema  Skin & Extremity Inspection: Skin color, temperature, and hair growth are WNL. No peripheral edema or cyanosis. No masses, redness, swelling, asymmetry, or associated skin lesions. No contractures.  Functional ROM: Adequate ROM                  Functional ROM: Adequate ROM                  Muscle Tone/Strength: Normal strength (5/5)  Muscle Tone/Strength: Normal strength (5/5)  Sensory (Neurological): Unimpaired  Sensory (Neurological): Unimpaired  Palpation: Tender  Palpation: Uncomfortable   Assessment  Primary Diagnosis & Pertinent Problem List: The primary encounter diagnosis was Spondylosis of lumbar spine. Diagnoses of Chronic lower extremity pain  (Bilateral) (R>L), Osteoarthritis of knees (Bilateral), and Chronic pain syndrome were also pertinent to this visit.  Status Diagnosis  Persistent Persistent Stable 1. Spondylosis of lumbar spine   2. Chronic lower extremity pain (Bilateral) (R>L)   3.  Osteoarthritis of knees (Bilateral)   4. Chronic pain syndrome     Problems updated and reviewed during this visit: Problem  Chronic Pain Syndrome  Major Depressive Disorder   Last Assessment & Plan:  Mood waxes and wanes  Last Assessment & Plan:  Mood is stable on meds   Anxiety Disorder  Mixed Hyperlipidemia  Gastroesophageal Reflux Disease   Plan of Care  Pharmacotherapy (Medications Ordered): Meds ordered this encounter  Medications  . oxyCODONE (OXY IR/ROXICODONE) 5 MG immediate release tablet    Sig: Take 1 tablet (5 mg total) by mouth every 6 (six) hours as needed for severe pain.    Dispense:  120 tablet    Refill:  0    Do not place this medication on "Automatic Refill". Patient may have prescription filled one day early if pharmacy is closed on scheduled refill date. Do not fill until: 03/07/2018 To last until:04/06/2018    Order Specific Question:   Supervising Provider    Answer:   Milinda Pointer [phone]  . oxyCODONE (OXY IR/ROXICODONE) 5 MG immediate release tablet    Sig: Take 1 tablet (5 mg total) by mouth every 6 (six) hours as needed for severe pain.    Dispense:  120 tablet    Refill:  0    Do not place this medication on "Automatic Refill". Patient may have prescription filled one day early if pharmacy is closed on scheduled refill date. Do not fill until:02/05/2018 To last until: 03/07/2018    Order Specific Question:   Supervising Provider    Answer:   Milinda Pointer [phone]  . oxyCODONE (OXY IR/ROXICODONE) 5 MG immediate release tablet    Sig: Take 1 tablet (5 mg total) by mouth every 6 (six) hours as needed for severe pain.    Dispense:  120 tablet    Refill:  0    Do not place this  medication on "Automatic Refill". Patient may have prescription filled one day early if pharmacy is closed on scheduled refill date. Do not fill until: 01/06/2018 To last until:02/05/2018    Order Specific Question:   Supervising Provider    Answer:   Milinda Pointer [phone]  . pregabalin (LYRICA) 50 MG capsule    Sig: Take 1 capsule (50 mg total) by mouth 3 (three) times daily.    Dispense:  90 capsule    Refill:  2    Do not place this medication, or any other prescription from our practice, on "Automatic Refill". Patient may have prescription filled one day early if pharmacy is closed on scheduled refill date.    Order Specific Question:   Supervising Provider    Answer:   Milinda Pointer [[id number]]   New Prescriptions   No medications on file   Medications administered today: Valoree A. [place] had no medications administered during this visit. Lab-work, procedure(s), and/or referral(s): No orders of the defined types were placed in this encounter.  Imaging and/or referral(s): None  Planned, scheduled, and/or pending:   Possible Diagnostic right-sided L5-S1 transforaminal epidural steroid injection. patient to call   Considering:  Left cervical epidural steroid injection #2 Diagnostic bilateral intra-articular knee injection Possible series of 5 bilateral [place] intra-articular knee injections Diagnostic bilateral genicular nerve blocks  Possible bilateral genicular nerve radiofrequency ablation Diagnostic bilateral lumbar facet block  Possible bilateral lumbar facet radiofrequency ablation  Diagnostic bilateral intra-articular shoulder joint injection  Diagnostic bilateral suprascapular nerve block  Possible bilateral suprascapular nerve radiofrequency ablation  Diagnostic right-sided L5-S1 transforaminal epidural steroid  injection Diagnostic left sided S1 transforaminal epidural steroid injection Diagnostic right-sided L4-5 lumbar epidural steroid injection     Palliative PRN treatment(s):  Palliative bilateral intra-articular knee injection with local anesthetic and steroid. Left cervical epidural steroid injection #2     Provider-requested follow-up: Return in about 3 months (around 04/03/2018) for MedMgmt with Me Donella Stade Edison Pace).  Future Appointments  Date Time Provider [place]  04/03/2018  9:30 AM Vevelyn Francois, NP [facility] None   Primary Care Physician: Zeb Comfort, MD Location: [facility] Outpatient Pain Management Facility Note by: Vevelyn Francois NP Date: 01/01/2018; Time: 12:58 PM  Pain Score Disclaimer: We use the NRS-11 scale. This is a self-reported, subjective measurement of pain severity with only modest accuracy. It is used primarily to identify changes within a particular patient. It must be understood that outpatient pain scales are significantly less accurate that those used for research, where they can be applied under ideal controlled circumstances with minimal exposure to variables. In reality, the score is likely to be a combination of pain intensity and pain affect, where pain affect describes the degree of emotional arousal or changes in action readiness caused by the sensory experience of pain. Factors such as social and work situation, setting, emotional state, anxiety levels, expectation, and prior pain experience may influence pain perception and show large inter-individual differences that may also be affected by time variables.  Patient instructions provided during this appointment: Patient Instructions   ____________________________________________________________________________________________  Medication Rules  Applies to: All patients receiving prescriptions (written or electronic).  Pharmacy of record: Pharmacy where electronic prescriptions will be sent. If written prescriptions are taken to a different pharmacy, please inform the nursing staff. The pharmacy listed in the electronic  medical record should be the one where you would like electronic prescriptions to be sent.  Prescription refills: Only during scheduled appointments. Applies to both, written and electronic prescriptions.  NOTE: The following applies primarily to controlled substances (Opioid* Pain Medications).   Patient's responsibilities: 1. Pain Pills: Bring all pain pills to every appointment (except for procedure appointments). 2. Pill Bottles: Bring pills in original pharmacy bottle. Always bring newest bottle. Bring bottle, even if empty. 3. Medication refills: You are responsible for knowing and keeping track of what medications you need refilled. The day before your appointment, write a list of all prescriptions that need to be refilled. Bring that list to your appointment and give it to the admitting nurse. Prescriptions will be written only during appointments. If you forget a medication, it will not be "Called in", "Faxed", or "electronically sent". You will need to get another appointment to get these prescribed. 4. Prescription Accuracy: You are responsible for carefully inspecting your prescriptions before leaving our office. Have the discharge nurse carefully go over each prescription with you, before taking them home. Make sure that your name is accurately spelled, that your address is correct. Check the name and dose of your medication to make sure it is accurate. Check the number of pills, and the written instructions to make sure they are clear and accurate. Make sure that you are given enough medication to last until your next medication refill appointment. 5. Taking Medication: Take medication as prescribed. Never take more pills than instructed. Never take medication more frequently than prescribed. Taking less pills or less frequently is permitted and encouraged, when it comes to controlled substances (written prescriptions).  6. Inform other Doctors: Always inform, all of your healthcare  providers, of all the medications you take.  7. Pain Medication from other Providers: You are not allowed to accept any additional pain medication from any other Doctor or Healthcare provider. There are two exceptions to this rule. (see below) In the event that you require additional pain medication, you are responsible for notifying us, as stated below. 8. Medication Agreement: You are responsible for carefully reading and following our Medication Agreement. This must be signed before receiving any prescriptions from our practice. Safely store a copy of your signed Agreement. Violations to the Agreement will result in no further prescriptions. (Additional copies of our Medication Agreement are available upon request.) 9. Laws, Rules, & Regulations: All patients are expected to follow all Federal and [employer], [employer], Rules, [employer]. Ignorance of the Laws does not constitute a valid excuse. The use of any illegal substances is prohibited. 10. Adopted CDC guidelines & recommendations: Target dosing levels will be at or below 60 MME/day. Use of benzodiazepines** is not recommended.  Exceptions: There are only two exceptions to the rule of not receiving pain medications from other Healthcare Providers. 1. Exception #1 (Emergencies): In the event of an emergency (i.e.: accident requiring emergency care), you are allowed to receive additional pain medication. However, you are responsible for: As soon as you are able, call our office [phone], at any time of the day or night, and leave a message stating your name, the date and nature of the emergency, and the name and dose of the medication prescribed. In the event that your call is answered by a member of our staff, make sure to document and save the date, time, and the name of the person that took your information.  2. Exception #2 (Planned Surgery): In the event that you are scheduled by another doctor or dentist to have any type of surgery or  procedure, you are allowed (for a period no longer than 30 days), to receive additional pain medication, for the acute post-op pain. However, in this case, you are responsible for picking up a copy of our "Post-op Pain Management for Surgeons" handout, and giving it to your surgeon or dentist. This document is available at our office, and does not require an appointment to obtain it. Simply go to our office during business hours (Monday-Thursday from 8:00 AM to 4:00 PM) (Friday 8:00 AM to 12:00 Noon) or if you have a scheduled appointment with [place], prior to your surgery, and ask for it by name. In addition, you will need to provide [place] with your name, name of your surgeon, type of surgery, and date of procedure or surgery.  *Opioid medications include: morphine, codeine, oxycodone, oxymorphone, hydrocodone, hydromorphone, meperidine, tramadol, tapentadol, buprenorphine, fentanyl, methadone. **Benzodiazepine medications include: diazepam (Valium), alprazolam (Xanax), clonazepam (Klonopine), lorazepam (Ativan), clorazepate (Tranxene), chlordiazepoxide (Librium), estazolam (Prosom), oxazepam (Serax), temazepam (Restoril), triazolam (Halcion) (Last updated: 07/19/2017) ____________________________________________________________________________________________    BMI Assessment: Estimated body mass index is 30.23 kg/m as calculated from the following:   Height as of this encounter: 5' 1"  (1.549 m).   Weight as of this encounter: 160 lb (72.6 kg).  BMI interpretation table: BMI level Category Range association with higher incidence of chronic pain  <18 kg/m2 Underweight   18.5-24.9 kg/m2 Ideal body weight   25-29.9 kg/m2 Overweight Increased incidence by 20%  30-34.9 kg/m2 Obese (Class I) Increased incidence by 68%  35-39.9 kg/m2 Severe obesity (Class II) Increased incidence by 136%  >40 kg/m2 Extreme obesity (Class III) Increased incidence by 254%   Patient's current BMI Ideal Body weight  Body  mass index is 30.23 kg/m. Ideal body weight: 47.8 kg (105 lb 6.1 oz) Adjusted ideal body weight: 57.7 kg (127 lb 3.7 oz)   BMI Readings from Last 4 Encounters:  01/01/18 30.23 kg/m  12/10/17 34.39 kg/m  10/25/17 28.35 kg/m  10/04/17 28.35 kg/m   Wt Readings from Last 4 Encounters:  01/01/18 160 lb (72.6 kg)  12/10/17 182 lb (82.6 kg)  10/25/17 155 lb (70.3 kg)  10/04/17 155 lb (70.3 kg)

## 2018-01-01 NOTE — Patient Instructions (Addendum)
____________________________________________________________________________________________  Medication Rules  Applies to: All patients receiving prescriptions (written or electronic).  Pharmacy of record: Pharmacy where electronic prescriptions will be sent. If written prescriptions are taken to a different pharmacy, please inform the nursing staff. The pharmacy listed in the electronic medical record should be the one where you would like electronic prescriptions to be sent.  Prescription refills: Only during scheduled appointments. Applies to both, written and electronic prescriptions.  NOTE: The following applies primarily to controlled substances (Opioid* Pain Medications).   Patient's responsibilities: 1. Pain Pills: Bring all pain pills to every appointment (except for procedure appointments). 2. Pill Bottles: Bring pills in original pharmacy bottle. Always bring newest bottle. Bring bottle, even if empty. 3. Medication refills: You are responsible for knowing and keeping track of what medications you need refilled. The day before your appointment, write a list of all prescriptions that need to be refilled. Bring that list to your appointment and give it to the admitting nurse. Prescriptions will be written only during appointments. If you forget a medication, it will not be "Called in", "Faxed", or "electronically sent". You will need to get another appointment to get these prescribed. 4. Prescription Accuracy: You are responsible for carefully inspecting your prescriptions before leaving our office. Have the discharge nurse carefully go over each prescription with you, before taking them home. Make sure that your name is accurately spelled, that your address is correct. Check the name and dose of your medication to make sure it is accurate. Check the number of pills, and the written instructions to make sure they are clear and accurate. Make sure that you are given enough medication to last  until your next medication refill appointment. 5. Taking Medication: Take medication as prescribed. Never take more pills than instructed. Never take medication more frequently than prescribed. Taking less pills or less frequently is permitted and encouraged, when it comes to controlled substances (written prescriptions).  6. Inform other Doctors: Always inform, all of your healthcare providers, of all the medications you take. 7. Pain Medication from other Providers: You are not allowed to accept any additional pain medication from any other Doctor or Healthcare provider. There are two exceptions to this rule. (see below) In the event that you require additional pain medication, you are responsible for notifying us, as stated below. 8. Medication Agreement: You are responsible for carefully reading and following our Medication Agreement. This must be signed before receiving any prescriptions from our practice. Safely store a copy of your signed Agreement. Violations to the Agreement will result in no further prescriptions. (Additional copies of our Medication Agreement are available upon request.) 9. Laws, Rules, & Regulations: All patients are expected to follow all Federal and State Laws, Statutes, Rules, & Regulations. Ignorance of the Laws does not constitute a valid excuse. The use of any illegal substances is prohibited. 10. Adopted CDC guidelines & recommendations: Target dosing levels will be at or below 60 MME/day. Use of benzodiazepines** is not recommended.  Exceptions: There are only two exceptions to the rule of not receiving pain medications from other Healthcare Providers. 1. Exception #1 (Emergencies): In the event of an emergency (i.e.: accident requiring emergency care), you are allowed to receive additional pain medication. However, you are responsible for: As soon as you are able, call our office [phone], at any time of the day or night, and leave a message stating your name, the  date and nature of the emergency, and the name and dose of the medication   prescribed. In the event that your call is answered by a member of our staff, make sure to document and save the date, time, and the name of the person that took your information.  2. Exception #2 (Planned Surgery): In the event that you are scheduled by another doctor or dentist to have any type of surgery or procedure, you are allowed (for a period no longer than 30 days), to receive additional pain medication, for the acute post-op pain. However, in this case, you are responsible for picking up a copy of our "Post-op Pain Management for Surgeons" handout, and giving it to your surgeon or dentist. This document is available at our office, and does not require an appointment to obtain it. Simply go to our office during business hours (Monday-Thursday from 8:00 AM to 4:00 PM) (Friday 8:00 AM to 12:00 Noon) or if you have a scheduled appointment with us, prior to your surgery, and ask for it by name. In addition, you will need to provide us with your name, name of your surgeon, type of surgery, and date of procedure or surgery.  *Opioid medications include: morphine, codeine, oxycodone, oxymorphone, hydrocodone, hydromorphone, meperidine, tramadol, tapentadol, buprenorphine, fentanyl, methadone. **Benzodiazepine medications include: diazepam (Valium), alprazolam (Xanax), clonazepam (Klonopine), lorazepam (Ativan), clorazepate (Tranxene), chlordiazepoxide (Librium), estazolam (Prosom), oxazepam (Serax), temazepam (Restoril), triazolam (Halcion) (Last updated: 07/19/2017) ____________________________________________________________________________________________    BMI Assessment: Estimated body mass index is 30.23 kg/m as calculated from the following:   Height as of this encounter: 5\' 1"  (1.549 m).   Weight as of this encounter: 160 lb (72.6 kg).  BMI interpretation table: BMI level Category Range association with higher  incidence of chronic pain  <18 kg/m2 Underweight   18.5-24.9 kg/m2 Ideal body weight   25-29.9 kg/m2 Overweight Increased incidence by 20%  30-34.9 kg/m2 Obese (Class I) Increased incidence by 68%  35-39.9 kg/m2 Severe obesity (Class II) Increased incidence by 136%  >40 kg/m2 Extreme obesity (Class III) Increased incidence by 254%   Patient's current BMI Ideal Body weight  Body mass index is 30.23 kg/m. Ideal body weight: 47.8 kg (105 lb 6.1 oz) Adjusted ideal body weight: 57.7 kg (127 lb 3.7 oz)   BMI Readings from Last 4 Encounters:  01/01/18 30.23 kg/m  12/10/17 34.39 kg/m  10/25/17 28.35 kg/m  10/04/17 28.35 kg/m   Wt Readings from Last 4 Encounters:  01/01/18 160 lb (72.6 kg)  12/10/17 182 lb (82.6 kg)  10/25/17 155 lb (70.3 kg)  10/04/17 155 lb (70.3 kg)

## 2018-01-01 NOTE — Progress Notes (Signed)
Nursing Pain Medication Assessment:  Safety precautions to be maintained throughout the outpatient stay will include: orient to surroundings, keep bed in low position, maintain call bell within reach at all times, provide assistance with transfer out of bed and ambulation.  Medication Inspection Compliance: Pill count conducted under aseptic conditions, in front of the patient. Neither the pills nor the bottle was removed from the patient's sight at any time. Once count was completed pills were immediately returned to the patient in their original bottle.  Medication: Oxycodone IR Pill/Patch Count: 0 of 120 pills remain Pill/Patch Appearance: Markings consistent with prescribed medication Bottle Appearance: Standard pharmacy container. Clearly labeled. Filled Date: 7 / 6819 / 2019 Last Medication intake:  Today

## 2018-01-02 ENCOUNTER — Encounter: Payer: Self-pay | Admitting: *Deleted

## 2018-01-02 ENCOUNTER — Other Ambulatory Visit: Payer: Self-pay

## 2018-01-03 NOTE — Discharge Instructions (Signed)

## 2018-01-07 ENCOUNTER — Ambulatory Visit: Payer: Medicare HMO | Admitting: Anesthesiology

## 2018-01-07 ENCOUNTER — Encounter
Admission: RE | Disposition: A | Discharge status: Home / Self Care | Payer: Self-pay | Source: Ambulatory Visit | Attending: Ophthalmology

## 2018-01-07 ENCOUNTER — Ambulatory Visit
Admission: RE | Admit: 2018-01-07 | Discharge: 2018-01-07 | Disposition: A | Discharge status: Home / Self Care | Payer: Medicare HMO | Source: Ambulatory Visit | Attending: Ophthalmology | Admitting: Ophthalmology

## 2018-01-07 DIAGNOSIS — M810 Age-related osteoporosis without current pathological fracture: Secondary | ICD-10-CM | POA: Diagnosis not present

## 2018-01-07 DIAGNOSIS — J45909 Unspecified asthma, uncomplicated: Secondary | ICD-10-CM | POA: Diagnosis not present

## 2018-01-07 DIAGNOSIS — Z79899 Other long term (current) drug therapy: Secondary | ICD-10-CM | POA: Insufficient documentation

## 2018-01-07 DIAGNOSIS — Z87891 Personal history of nicotine dependence: Secondary | ICD-10-CM | POA: Insufficient documentation

## 2018-01-07 DIAGNOSIS — E7800 Pure hypercholesterolemia, unspecified: Secondary | ICD-10-CM | POA: Diagnosis not present

## 2018-01-07 DIAGNOSIS — M549 Dorsalgia, unspecified: Secondary | ICD-10-CM | POA: Insufficient documentation

## 2018-01-07 DIAGNOSIS — H2511 Age-related nuclear cataract, right eye: Secondary | ICD-10-CM | POA: Insufficient documentation

## 2018-01-07 DIAGNOSIS — Z79891 Long term (current) use of opiate analgesic: Secondary | ICD-10-CM | POA: Diagnosis not present

## 2018-01-07 DIAGNOSIS — I10 Essential (primary) hypertension: Secondary | ICD-10-CM | POA: Insufficient documentation

## 2018-01-07 HISTORY — PX: CATARACT EXTRACTION W/PHACO: SHX586

## 2018-01-07 SURGERY — PHACOEMULSIFICATION, CATARACT, WITH IOL INSERTION
Anesthesia: Monitor Anesthesia Care | Site: Eye | Laterality: Right | Wound class: "Clean "

## 2018-01-07 MED ORDER — ACETAMINOPHEN 325 MG PO TABS: 325.0000 mg | ORAL_TABLET | ORAL | Status: DC | PRN

## 2018-01-07 MED ORDER — SODIUM HYALURONATE 10 MG/ML IO SOLN
INTRAOCULAR | Status: DC | PRN
  Administered 2018-01-07: 0.55 mL via INTRAOCULAR

## 2018-01-07 MED ORDER — LIDOCAINE HCL (PF) 2 % IJ SOLN
INTRAOCULAR | Status: DC | PRN
  Administered 2018-01-07: 1 mL via INTRAOCULAR

## 2018-01-07 MED ORDER — ACETAMINOPHEN 160 MG/5ML PO SOLN: 325.0000 mg | ORAL | Status: DC | PRN

## 2018-01-07 MED ORDER — SODIUM HYALURONATE 23 MG/ML IO SOLN
INTRAOCULAR | Status: DC | PRN
  Administered 2018-01-07: 0.6 mL via INTRAOCULAR

## 2018-01-07 MED ORDER — EPINEPHRINE PF 1 MG/ML IJ SOLN
INTRAOCULAR | Status: DC | PRN
  Administered 2018-01-07: 80 mL via OPHTHALMIC

## 2018-01-07 MED ORDER — MIDAZOLAM HCL 2 MG/2ML IJ SOLN
INTRAMUSCULAR | Status: DC | PRN
  Administered 2018-01-07 (×2): 1 mg via INTRAVENOUS

## 2018-01-07 MED ORDER — ARMC OPHTHALMIC DILATING DROPS
1.0000 "application " | OPHTHALMIC | Status: DC | PRN
  Administered 2018-01-07 (×3): 1 via OPHTHALMIC

## 2018-01-07 MED ORDER — DIPHENHYDRAMINE HCL 50 MG/ML IJ SOLN
INTRAMUSCULAR | Status: DC | PRN
  Administered 2018-01-07: 25 mg via INTRAVENOUS

## 2018-01-07 MED ORDER — FENTANYL CITRATE (PF) 100 MCG/2ML IJ SOLN
INTRAMUSCULAR | Status: DC | PRN
  Administered 2018-01-07 (×2): 50 ug via INTRAVENOUS

## 2018-01-07 MED ORDER — MOXIFLOXACIN HCL 0.5 % OP SOLN
OPHTHALMIC | Status: DC | PRN
  Administered 2018-01-07: 0.2 mL via OPHTHALMIC

## 2018-01-07 SURGICAL SUPPLY — 17 items
CANNULA ANT/CHMB 27G (MISCELLANEOUS) ×1 IMPLANT
CANNULA ANT/CHMB 27GA (MISCELLANEOUS) ×3 IMPLANT
DISSECTOR HYDRO NUCLEUS 50X22 (MISCELLANEOUS) ×3 IMPLANT
GLOVE BIO SURGEON STRL SZ8 (GLOVE) ×3 IMPLANT
GLOVE SURG LX 7.5 STRW (GLOVE) ×2
GLOVE SURG LX STRL 7.5 STRW (GLOVE) ×1 IMPLANT
GOWN STRL REUS W/ TWL LRG LVL3 (GOWN DISPOSABLE) ×2 IMPLANT
GOWN STRL REUS W/TWL LRG LVL3 (GOWN DISPOSABLE) ×4
LENS IOL TECNIS ITEC 20.0 (Intraocular Lens) ×2 IMPLANT
MARKER SKIN DUAL TIP RULER LAB (MISCELLANEOUS) ×3 IMPLANT
PACK CATARACT (MISCELLANEOUS) ×3 IMPLANT
PACK DR. KING ARMS (PACKS) ×3 IMPLANT
PACK EYE AFTER SURG (MISCELLANEOUS) ×3 IMPLANT
SYR 3ML LL SCALE MARK (SYRINGE) ×3 IMPLANT
SYR TB 1ML LUER SLIP (SYRINGE) ×3 IMPLANT
WATER STERILE IRR 500ML POUR (IV SOLUTION) ×3 IMPLANT
WIPE NON LINTING 3.25X3.25 (MISCELLANEOUS) ×3 IMPLANT

## 2018-01-07 NOTE — Op Note (Signed)
OPERATIVE NOTE  Jennifer FosterDorothy A Rix [id number] 01/07/2018   PREOPERATIVE DIAGNOSIS:  Nuclear sclerotic cataract right eye.  H25.11   POSTOPERATIVE DIAGNOSIS:    Nuclear sclerotic cataract right eye.     PROCEDURE:  Phacoemusification with posterior chamber intraocular lens placement of the right eye   LENS:   Implant Name Type Inv. Item Serial No. Manufacturer Lot No. LRB No. Used  LENS IOL DIOP 20.0 - [id number] Intraocular Lens LENS IOL DIOP 20.0 [id number]  Right 1       PCB00 +20.0   ULTRASOUND TIME: 0 minutes 44 seconds.  CDE 4.70   SURGEON:  Willey BladeBradley Penni Penado, MD, MPH  ANESTHESIOLOGIST: Anesthesiologist: Baxter FlatteryBacon, David, MD CRNA: Orlin HildingLeblanc, Monique, CRNA   ANESTHESIA:  Topical with tetracaine drops augmented with 1% preservative-free intracameral lidocaine.  ESTIMATED BLOOD LOSS: less than 1 mL.   COMPLICATIONS:  None.   DESCRIPTION OF PROCEDURE:  The patient was identified in the holding room and transported to the operating room and placed in the supine position under the operating microscope.  The right eye was identified as the operative eye and it was prepped and draped in the usual sterile ophthalmic fashion.   A 1.0 millimeter clear-corneal paracentesis was made at the 10:30 position. 0.5 ml of preservative-free 1% lidocaine with epinephrine was injected into the anterior chamber.  The anterior chamber was filled with Healon 5 viscoelastic.  A 2.4 millimeter keratome was used to make a near-clear corneal incision at the 8:00 position.  A curvilinear capsulorrhexis was made with a cystotome and capsulorrhexis forceps.  Balanced salt solution was used to hydrodissect and hydrodelineate the nucleus.   Phacoemulsification was then used in stop and chop fashion to remove the lens nucleus and epinucleus.  The remaining cortex was then removed using the irrigation and aspiration handpiece. Healon was then placed into the capsular bag to distend it for lens placement.  A lens was  then injected into the capsular bag.  The remaining viscoelastic was aspirated.   Wounds were hydrated with balanced salt solution.  The anterior chamber was inflated to a physiologic pressure with balanced salt solution.   Intracameral vigamox 0.1 mL undiluted was injected into the eye and a drop placed onto the ocular surface.  No wound leaks were noted.  The patient was taken to the recovery room in stable condition without complications of anesthesia or surgery  Willey BladeBradley Rj Pedrosa 01/07/2018, 1:12 PM

## 2018-01-07 NOTE — H&P (Signed)
The History and Physical notes are on paper, have been signed, and are to be scanned.   I have examined the patient and there are no changes to the H&P.   Jennifer BladeBradley Jaleal Schliep 01/07/2018 12:35 PM

## 2018-01-07 NOTE — Anesthesia Preprocedure Evaluation (Signed)
Anesthesia Evaluation  Patient identified by MRN, date of birth, ID band Patient awake    Reviewed: Allergy & Precautions, H&P , NPO status , Patient's Chart, lab work & pertinent test results, reviewed documented beta blocker date and time   Airway Mallampati: III  TM Distance: >3 FB Neck ROM: full    Dental no notable dental hx.    Pulmonary asthma , former smoker,    Pulmonary exam normal breath sounds clear to auscultation       Cardiovascular Exercise Tolerance: Good hypertension, Normal cardiovascular exam Rhythm:regular Rate:Normal     Neuro/Psych PSYCHIATRIC DISORDERS Anxiety Depression Chronic opiate use  Neuromuscular disease    GI/Hepatic Neg liver ROS, GERD  ,  Endo/Other  negative endocrine ROS  Renal/GU negative Renal ROS  negative genitourinary   Musculoskeletal  (+) Arthritis , Hx of back pain and chronic opiod use   Abdominal   Peds  Hematology negative hematology ROS (+)   Anesthesia Other Findings   Reproductive/Obstetrics negative OB ROS                             Anesthesia Physical Anesthesia Plan  ASA: III  Anesthesia Plan: MAC   Post-op Pain Management:    Induction:   PONV Risk Score and Plan:   Airway Management Planned:   Additional Equipment:   Intra-op Plan:   Post-operative Plan:   Informed Consent: I have reviewed the patients History and Physical, chart, labs and discussed the procedure including the risks, benefits and alternatives for the proposed anesthesia with the patient or authorized representative who has indicated his/her understanding and acceptance.   Dental Advisory Given  Plan Discussed with: CRNA  Anesthesia Plan Comments:         Anesthesia Quick Evaluation

## 2018-01-07 NOTE — Transfer of Care (Signed)
Immediate Anesthesia Transfer of Care Note  Patient: Jennifer Rivers  Procedure(s) Performed: CATARACT EXTRACTION PHACO AND INTRAOCULAR LENS PLACEMENT (IOC) RIGHT (Right Eye)  Patient Location: PACU  Anesthesia Type: MAC  Level of Consciousness: awake, alert  and patient cooperative  Airway and Oxygen Therapy: Patient Spontanous Breathing and Patient connected to supplemental oxygen  Post-op Assessment: Post-op Vital signs reviewed, Patient's Cardiovascular Status Stable, Respiratory Function Stable, Patent Airway and No signs of Nausea or vomiting  Post-op Vital Signs: Reviewed and stable  Complications: No apparent anesthesia complications

## 2018-01-07 NOTE — Anesthesia Procedure Notes (Signed)
Procedure Name: MAC Date/Time: 01/07/2018 12:54 PM Performed by: Lind Guest, CRNA Pre-anesthesia Checklist: Patient identified, Emergency Drugs available, Suction available, Timeout performed and Patient being monitored Patient Re-evaluated:Patient Re-evaluated prior to induction Oxygen Delivery Method: Nasal cannula

## 2018-01-07 NOTE — Anesthesia Postprocedure Evaluation (Signed)
Anesthesia Post Note  Patient: Jennifer Rivers  Procedure(s) Performed: CATARACT EXTRACTION PHACO AND INTRAOCULAR LENS PLACEMENT (IOC) RIGHT (Right Eye)  Patient location during evaluation: PACU Anesthesia Type: MAC Level of consciousness: awake and alert Pain management: pain level controlled Vital Signs Assessment: post-procedure vital signs reviewed and stable Respiratory status: spontaneous breathing, nonlabored ventilation, respiratory function stable and patient connected to nasal cannula oxygen Cardiovascular status: stable and blood pressure returned to baseline Postop Assessment: no apparent nausea or vomiting Anesthetic complications: no    Trecia Fenstermacher

## 2018-01-08 ENCOUNTER — Encounter: Payer: Self-pay | Admitting: Ophthalmology

## 2018-03-03 IMAGING — CR DG SI JOINTS 3+V
1 series · 3 of 3 positions shown · non-contrast
Comparison: Lumbar spine films of 10/03/2007

CLINICAL DATA: Chronic back pain

EXAM:
BILATERAL SACROILIAC JOINTS - 3+ VIEW

[Series 1: dg si joints · 0.14mm/px · 3 of 3 slices shown]
[im 1/3]
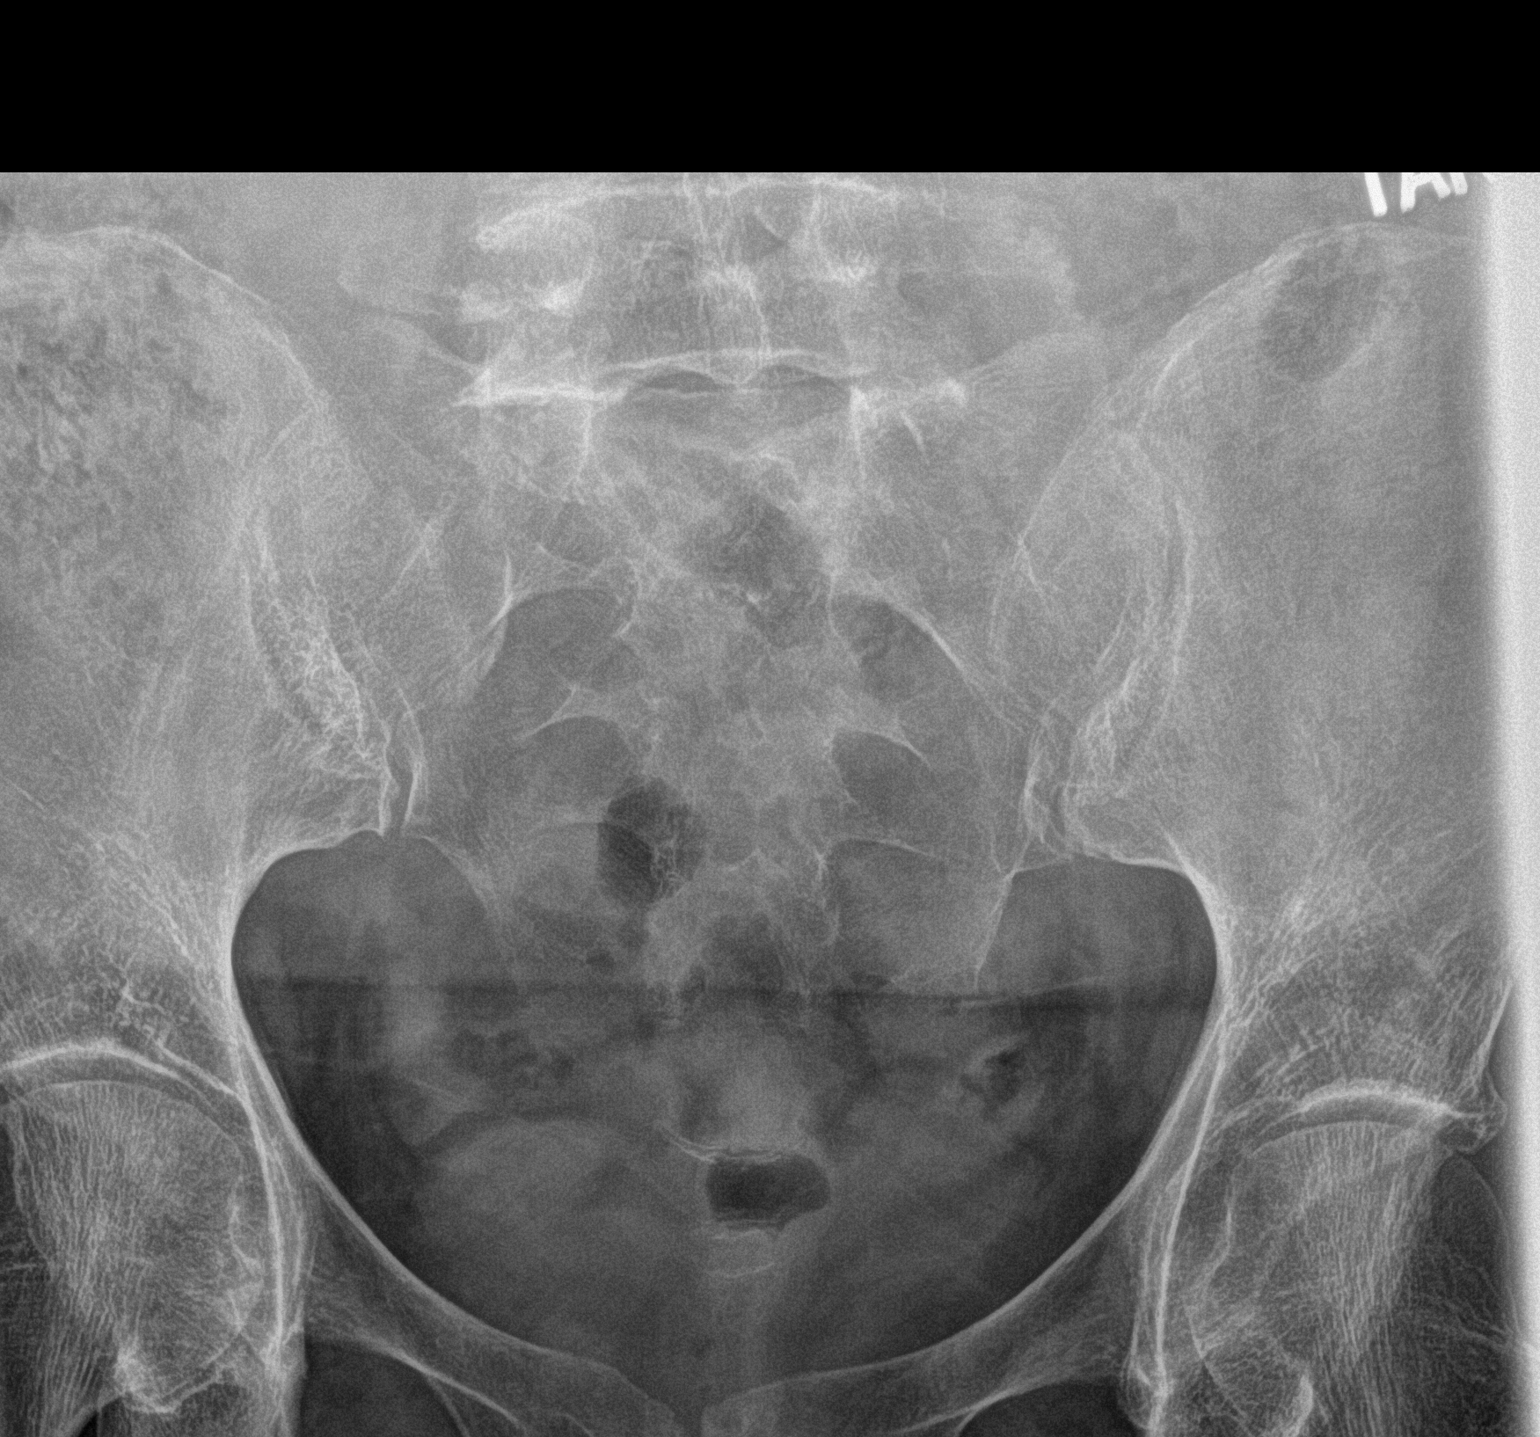
[im 2/3]
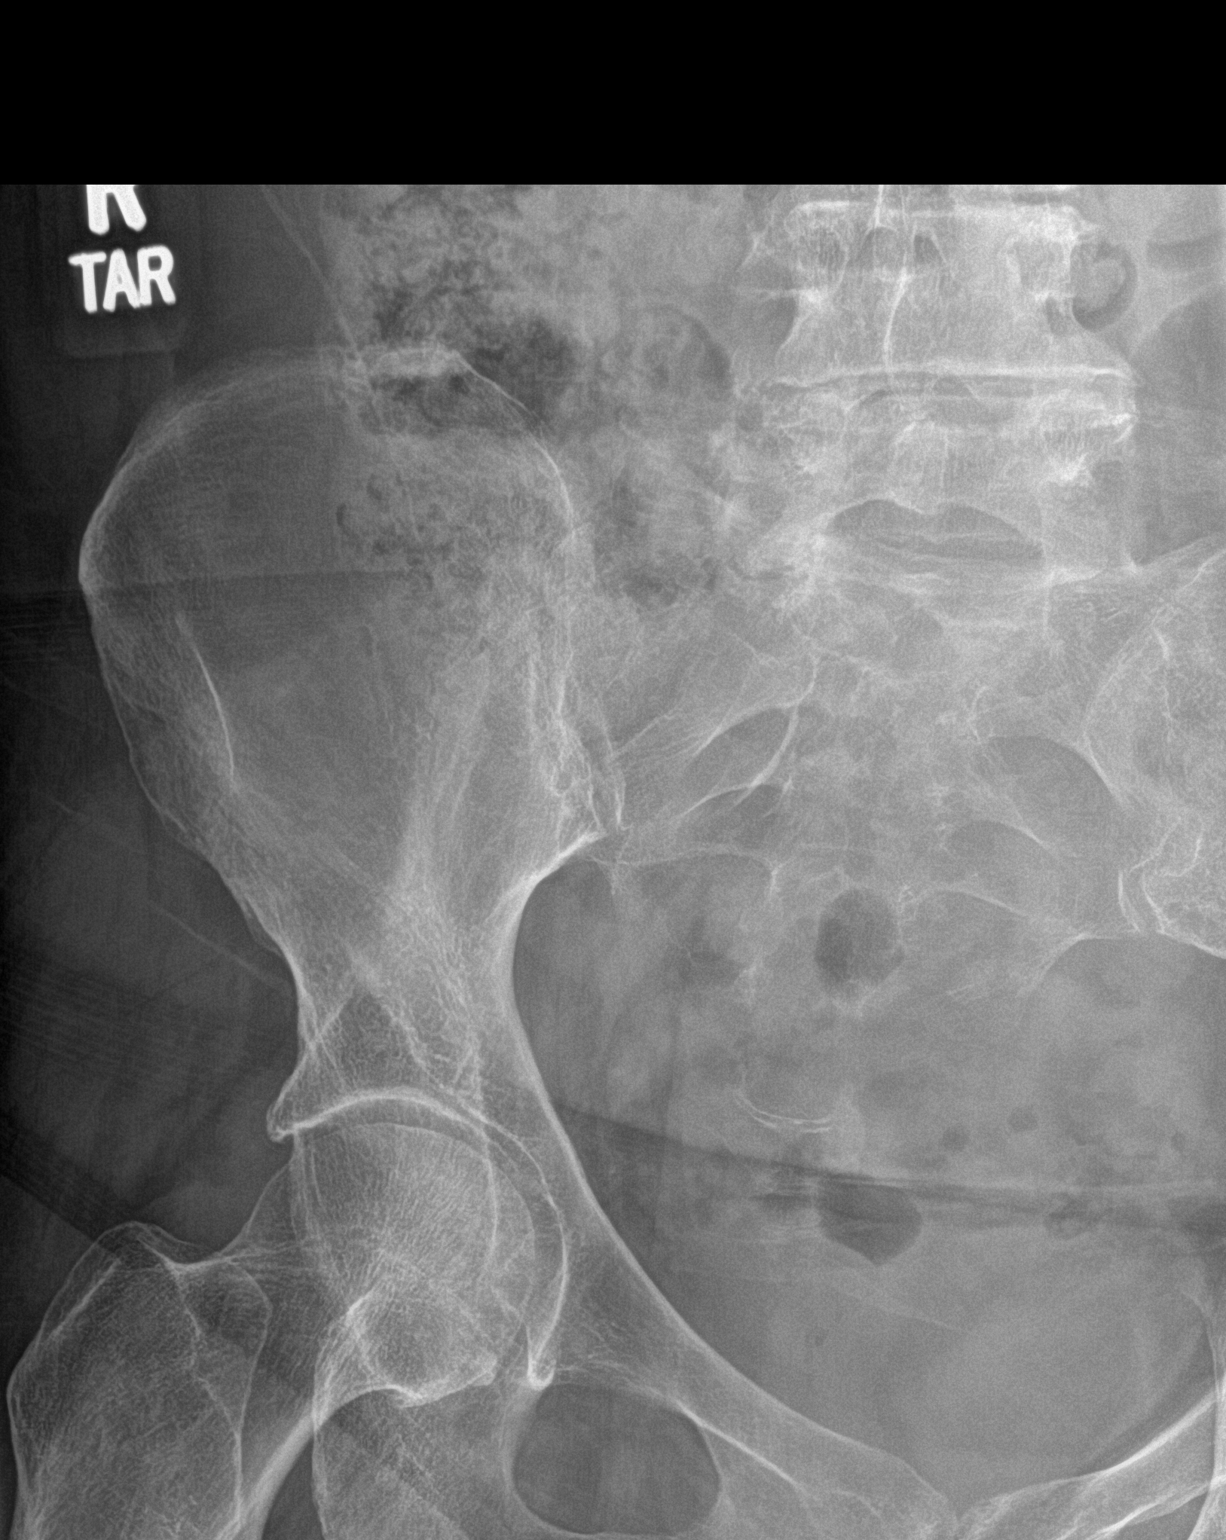
[im 3/3]
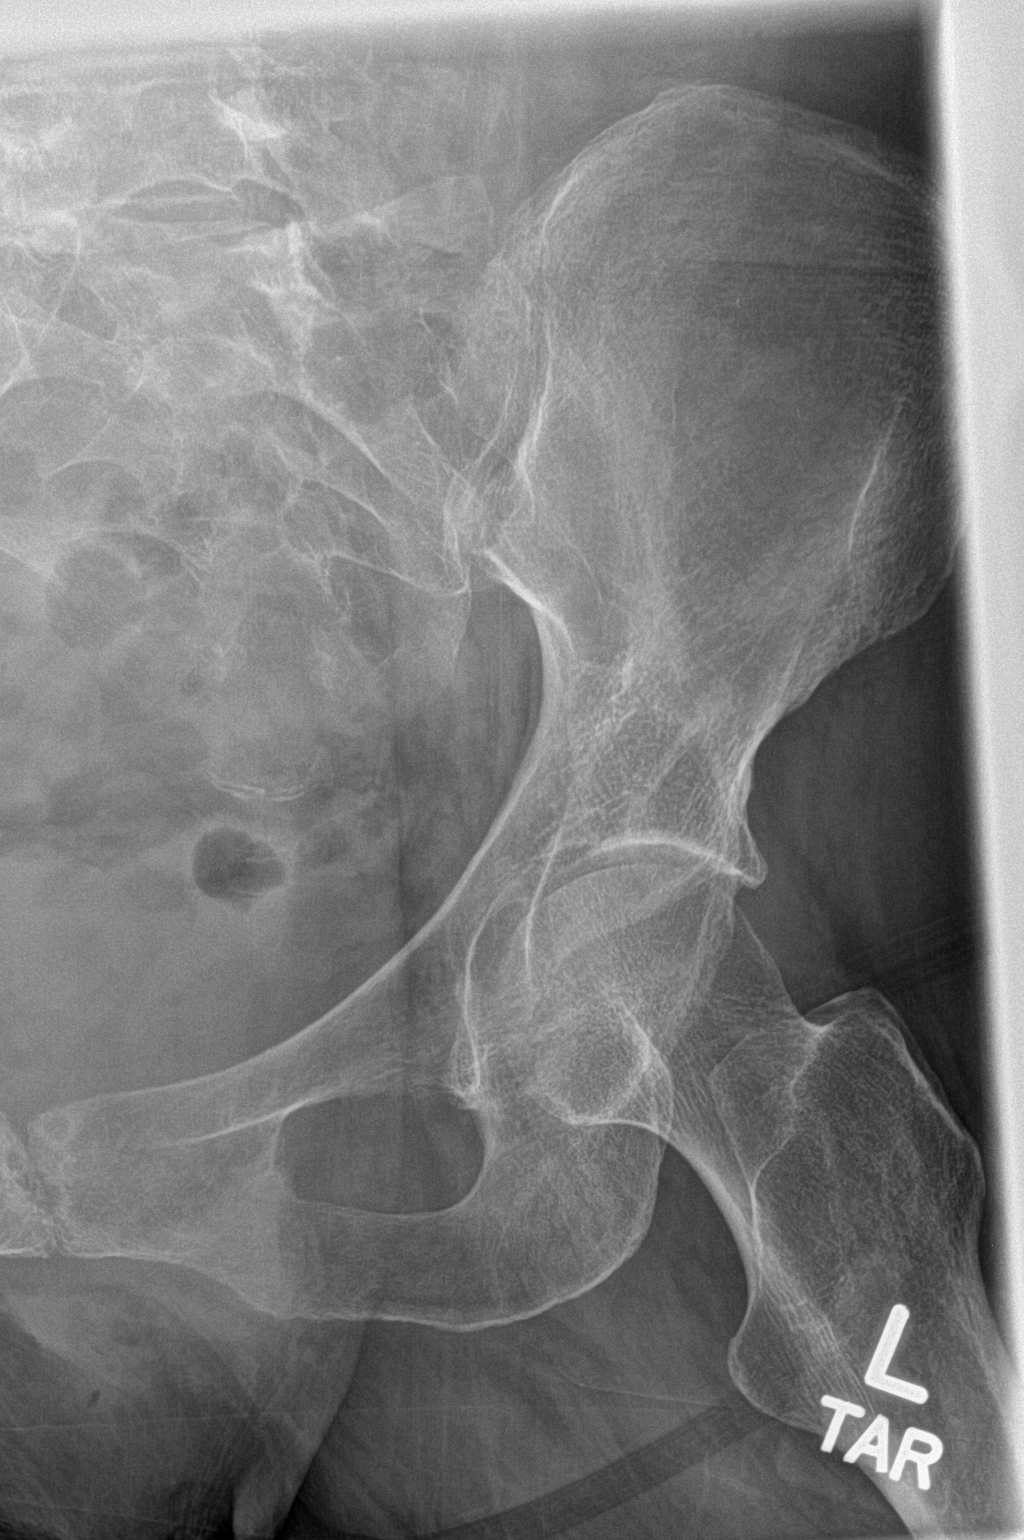

[3 of 3 positions shown; findings below may reference images not displayed]

FINDINGS: The SI joints appear well corticated. There is no evidence of
sacroiliitis. The sacral foramina also are well corticated. No bony
abnormality is seen.
IMPRESSION: No evidence of sacroiliitis.

## 2018-03-03 IMAGING — CR DG KNEE 1-2V*L*
1 series · 2 of 2 positions shown · non-contrast
Comparison: None.

CLINICAL DATA: Chronic knee pain

EXAM:
LEFT KNEE - 1-2 VIEW

[Series 1: dg knee 1-2 views left · 0.14mm/px · 2 of 2 slices shown]
[im 1/2]
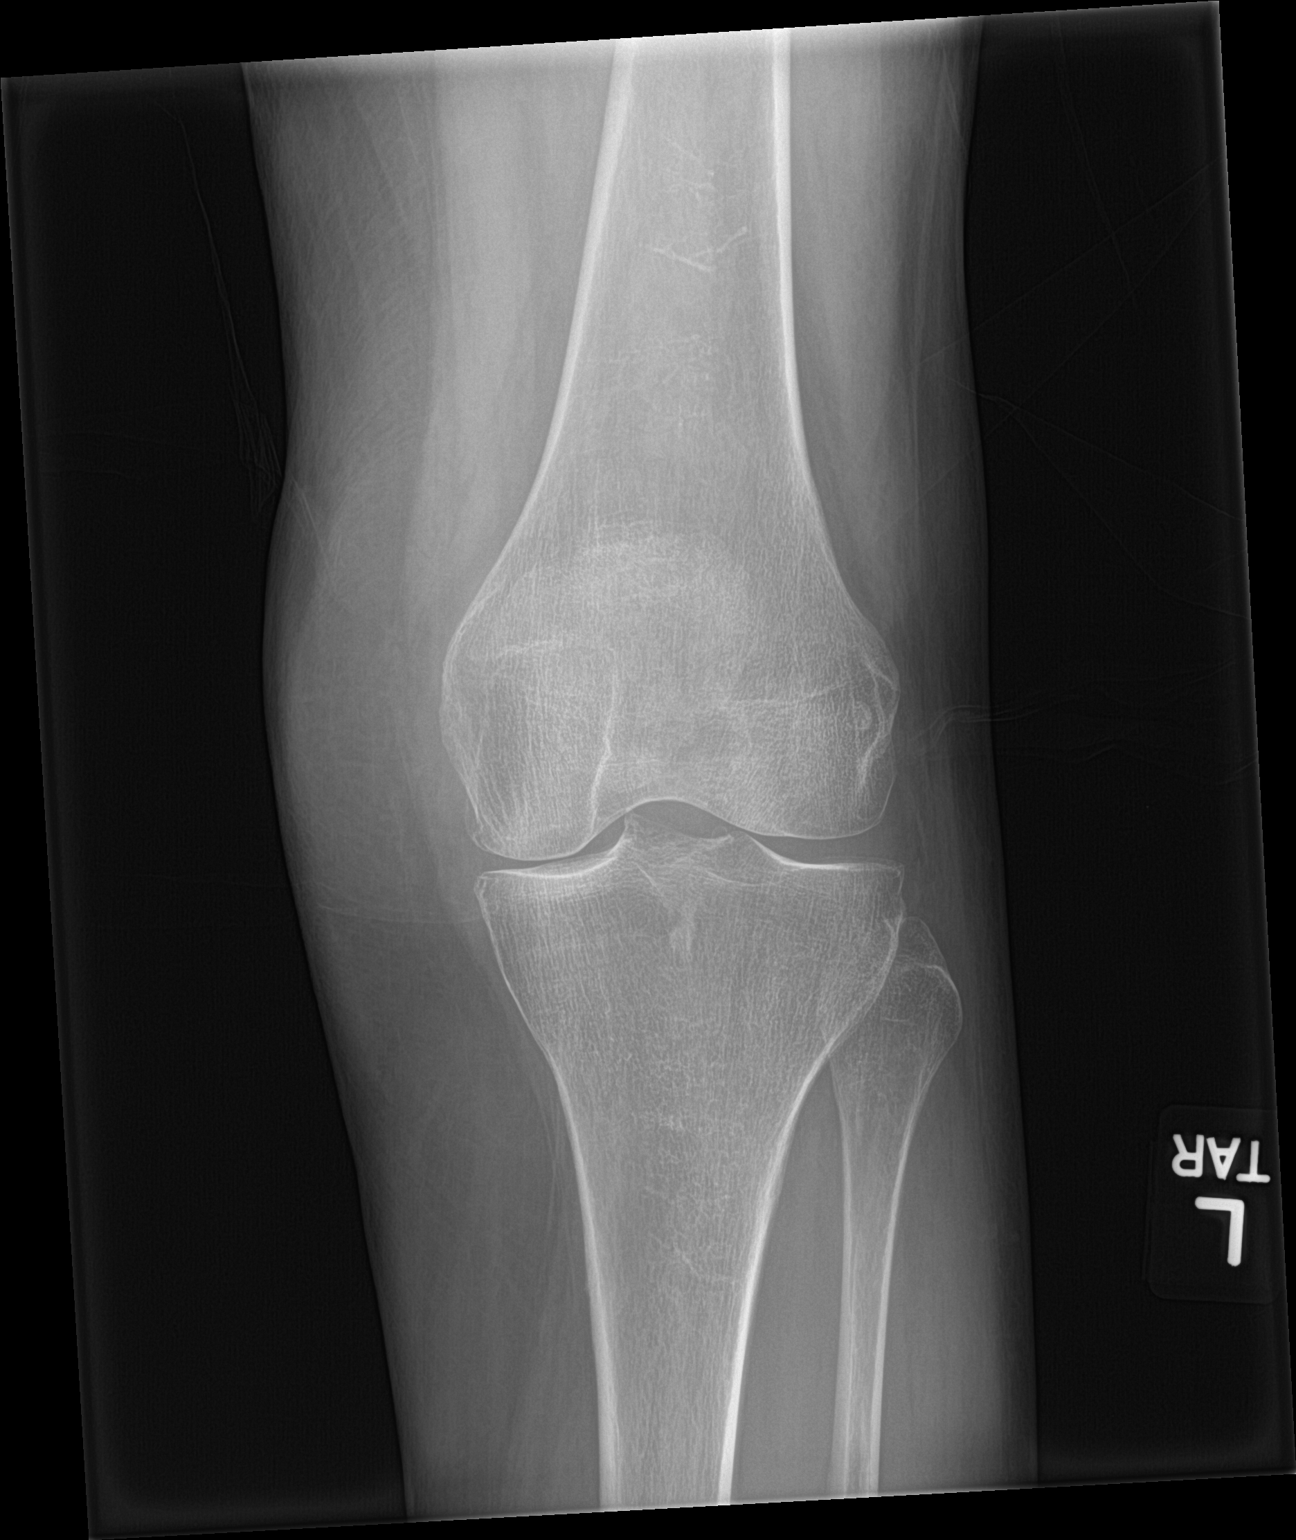
[im 2/2]
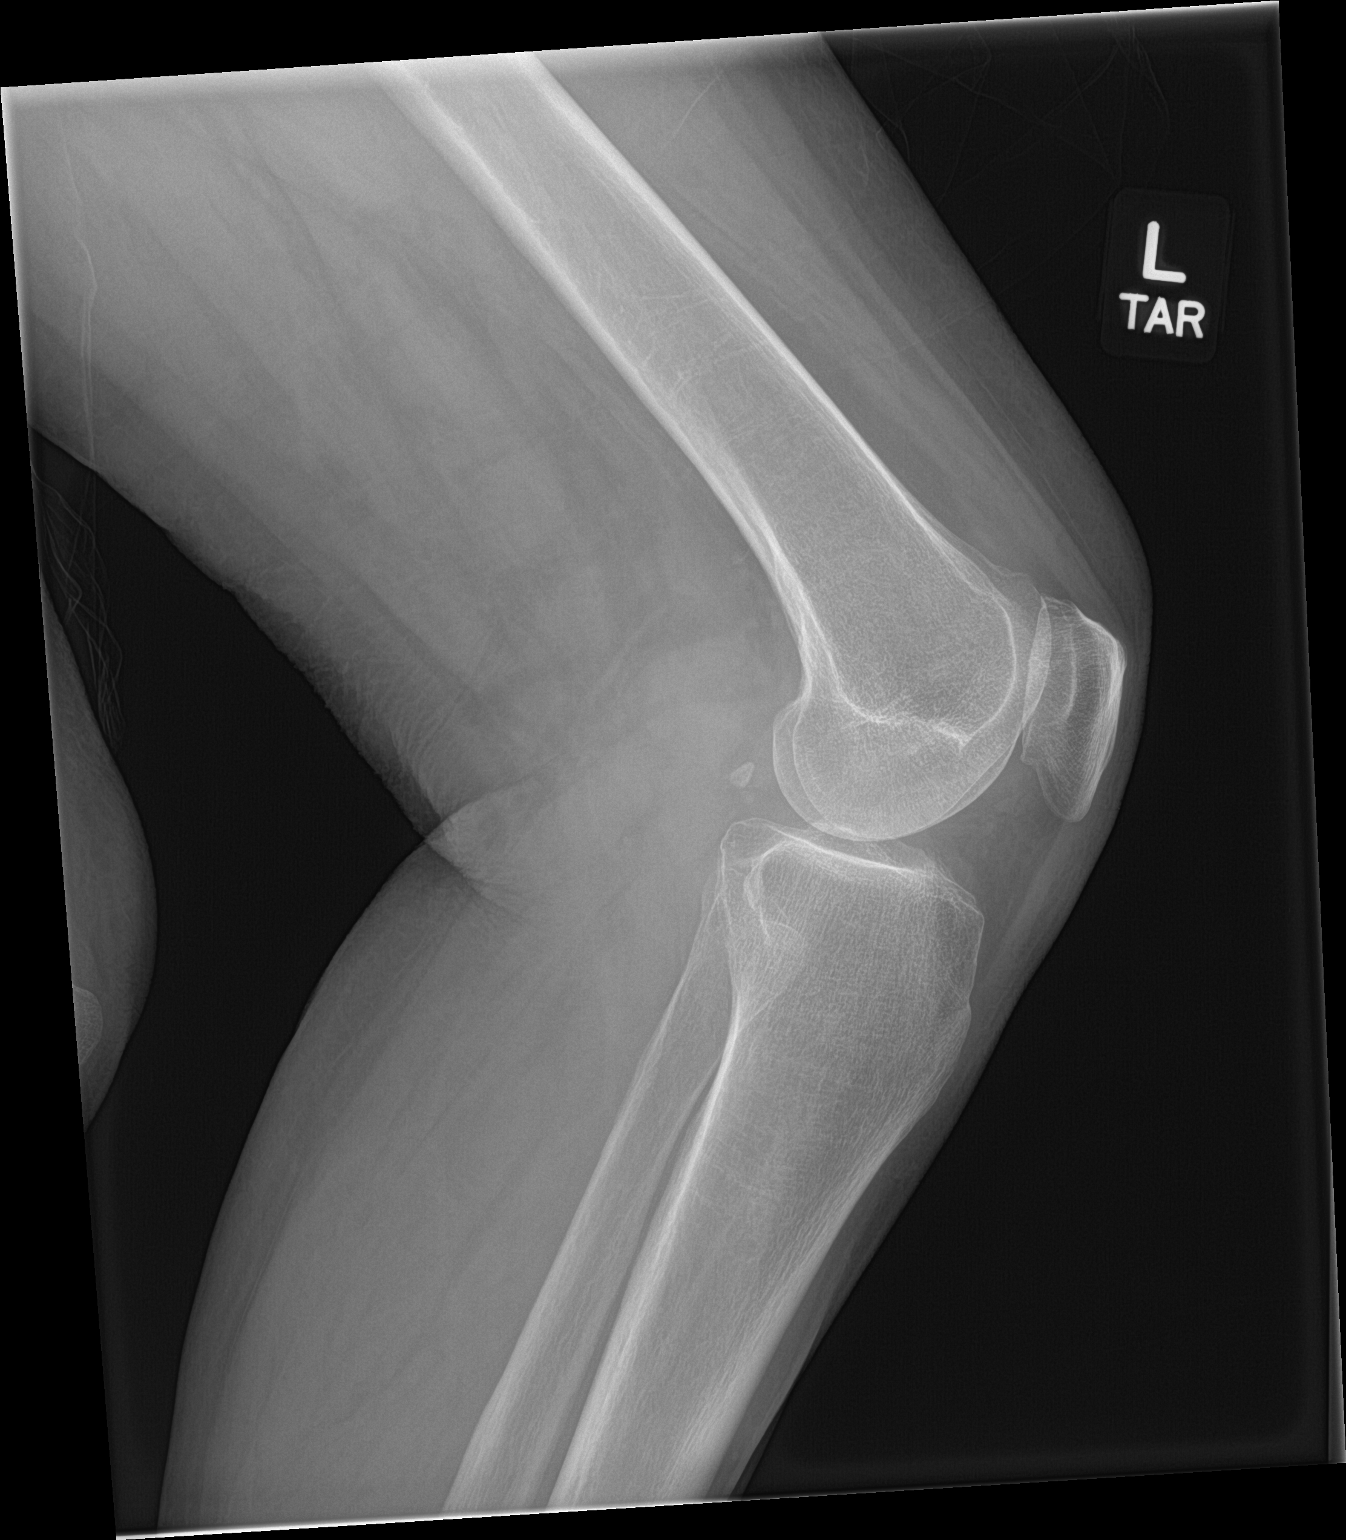

[2 of 2 positions shown; findings below may reference images not displayed]

FINDINGS: Two views of the left knee show primarily medial compartment
degenerative joint disease with loss of joint space and some
sclerosis with spurring. Both the lateral compartment and
patellofemoral compartments are relatively well preserved. No joint
effusion is seen. No fractures noted.
IMPRESSION: Degenerative joint disease involving the medial compartment. No
acute abnormality.

## 2018-03-03 IMAGING — CR DG KNEE 1-2V*R*
1 series · 2 of 2 positions shown · non-contrast
Comparison: MR right knee of 08/18/2015

CLINICAL DATA: Chronic knee pain, prior right knee arthroscopy

EXAM:
RIGHT KNEE - 1-2 VIEW

[Series 1: dg knee 1-2 views right · 0.14mm/px · 2 of 2 slices shown]
[im 1/2]
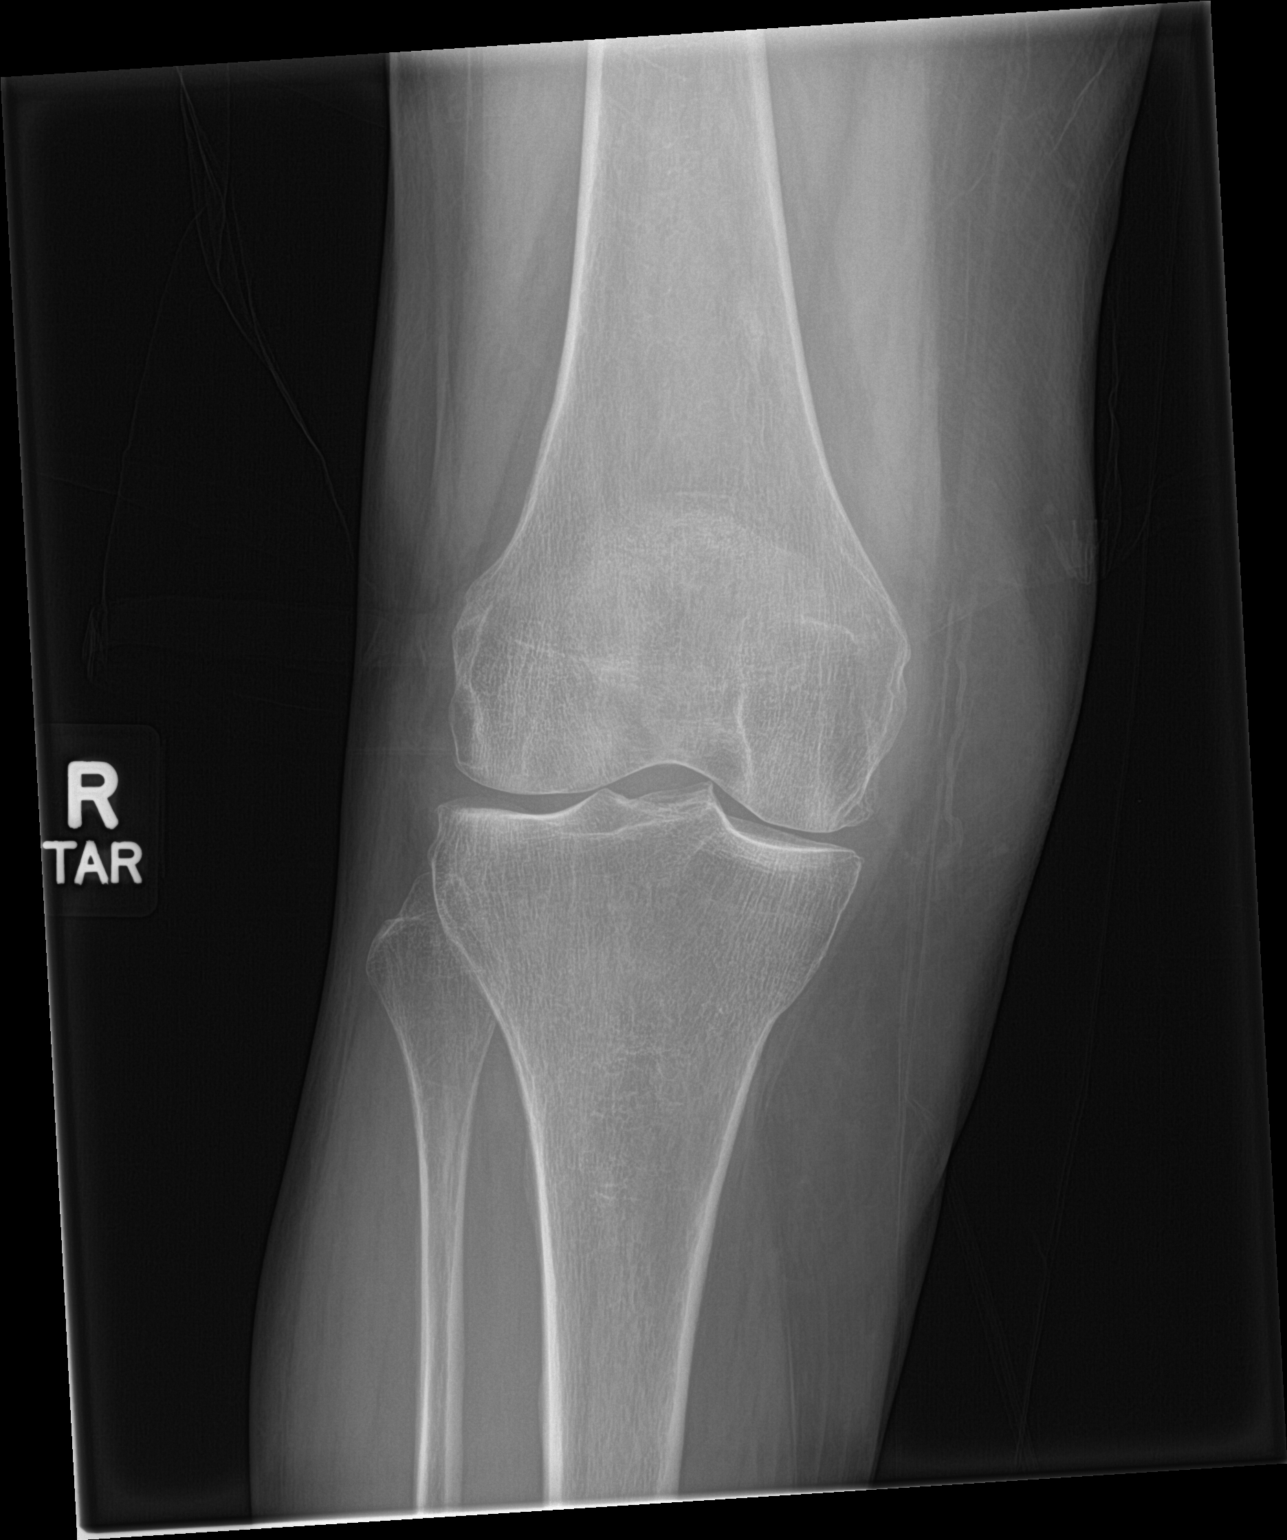
[im 2/2]
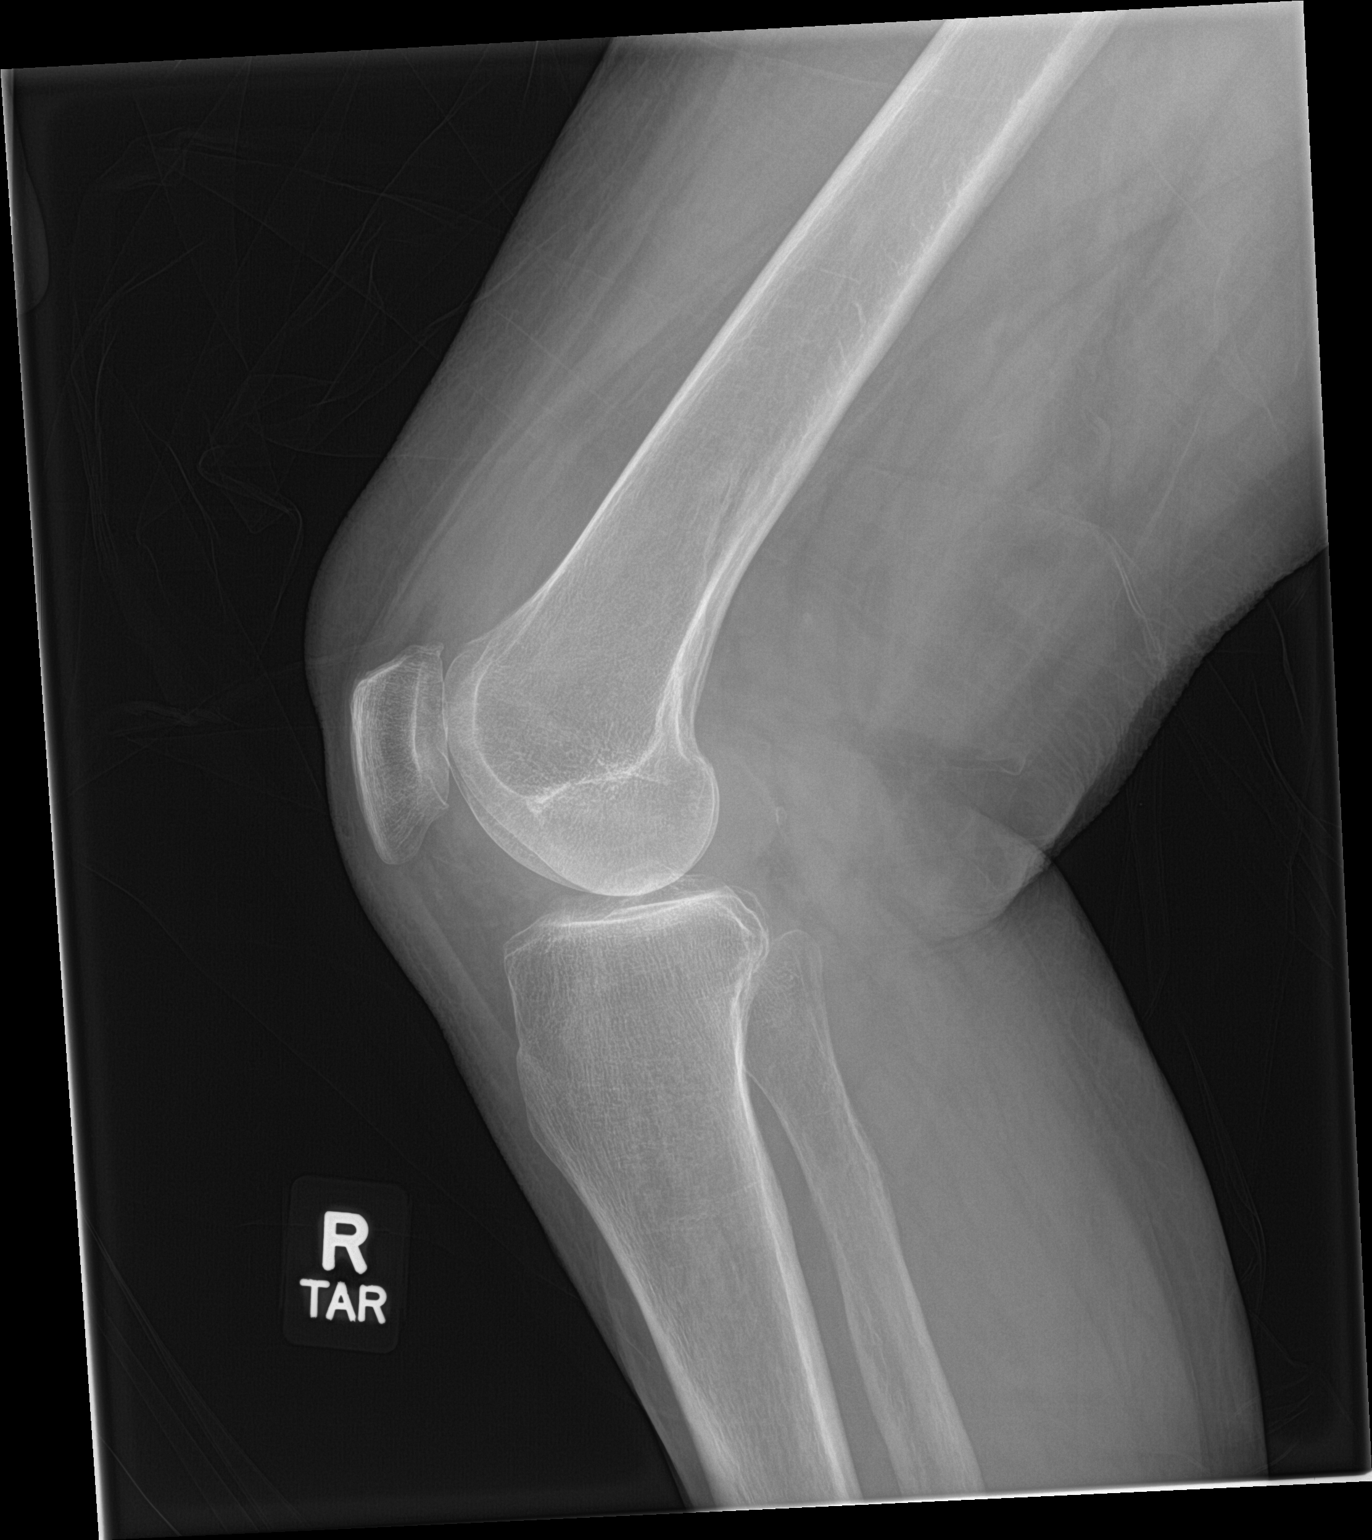

[2 of 2 positions shown; findings below may reference images not displayed]

FINDINGS: There is bicompartmental degenerative joint disease the right knee
primarily involving the medial and patellofemoral compartments with
some loss of joint space and spurring. No fracture is seen. However
there may be a small amount of joint fluid present.
IMPRESSION: 1. Bicompartmental degenerative joint disease the right knee.
2. Apparent small right knee joint effusion.

## 2018-04-03 ENCOUNTER — Encounter: Payer: Self-pay | Admitting: Nurse Practitioner

## 2018-04-03 ENCOUNTER — Ambulatory Visit: Payer: Medicare HMO | Attending: Nurse Practitioner | Admitting: Nurse Practitioner

## 2018-04-03 VITALS — BP 133/78 | HR 66 | Temp 98.0°F | Resp 16 | Ht 61.0 in | Wt 160.0 lb

## 2018-04-03 DIAGNOSIS — M160 Bilateral primary osteoarthritis of hip: Secondary | ICD-10-CM | POA: Insufficient documentation

## 2018-04-03 DIAGNOSIS — E782 Mixed hyperlipidemia: Secondary | ICD-10-CM | POA: Insufficient documentation

## 2018-04-03 DIAGNOSIS — Z79899 Other long term (current) drug therapy: Secondary | ICD-10-CM | POA: Diagnosis not present

## 2018-04-03 DIAGNOSIS — Z79891 Long term (current) use of opiate analgesic: Secondary | ICD-10-CM | POA: Diagnosis not present

## 2018-04-03 DIAGNOSIS — M47816 Spondylosis without myelopathy or radiculopathy, lumbar region: Secondary | ICD-10-CM

## 2018-04-03 DIAGNOSIS — K219 Gastro-esophageal reflux disease without esophagitis: Secondary | ICD-10-CM | POA: Diagnosis not present

## 2018-04-03 DIAGNOSIS — M48061 Spinal stenosis, lumbar region without neurogenic claudication: Secondary | ICD-10-CM

## 2018-04-03 DIAGNOSIS — M792 Neuralgia and neuritis, unspecified: Secondary | ICD-10-CM | POA: Diagnosis not present

## 2018-04-03 DIAGNOSIS — I10 Essential (primary) hypertension: Secondary | ICD-10-CM | POA: Diagnosis not present

## 2018-04-03 DIAGNOSIS — F419 Anxiety disorder, unspecified: Secondary | ICD-10-CM | POA: Insufficient documentation

## 2018-04-03 DIAGNOSIS — M810 Age-related osteoporosis without current pathological fracture: Secondary | ICD-10-CM | POA: Insufficient documentation

## 2018-04-03 DIAGNOSIS — M47817 Spondylosis without myelopathy or radiculopathy, lumbosacral region: Secondary | ICD-10-CM | POA: Diagnosis not present

## 2018-04-03 DIAGNOSIS — G894 Chronic pain syndrome: Secondary | ICD-10-CM

## 2018-04-03 DIAGNOSIS — M79671 Pain in right foot: Secondary | ICD-10-CM | POA: Diagnosis not present

## 2018-04-03 DIAGNOSIS — J449 Chronic obstructive pulmonary disease, unspecified: Secondary | ICD-10-CM | POA: Diagnosis not present

## 2018-04-03 DIAGNOSIS — Z5181 Encounter for therapeutic drug level monitoring: Secondary | ICD-10-CM | POA: Insufficient documentation

## 2018-04-03 MED ORDER — OXYCODONE HCL 5 MG PO TABS: 5.0000 mg | ORAL_TABLET | Freq: Four times a day (QID) | ORAL | 0 refills | Status: DC | PRN

## 2018-04-03 MED ORDER — PREGABALIN 50 MG PO CAPS: 50.0000 mg | ORAL_CAPSULE | Freq: Three times a day (TID) | ORAL | 2 refills | Status: DC

## 2018-04-03 NOTE — Patient Instructions (Addendum)
____________________________________________________________________________________________  Medication Rules  Applies to: All patients receiving prescriptions (written or electronic).  Pharmacy of record: Pharmacy where electronic prescriptions will be sent. If written prescriptions are taken to a different pharmacy, please inform the nursing staff. The pharmacy listed in the electronic medical record should be the one where you would like electronic prescriptions to be sent.  Prescription refills: Only during scheduled appointments. Applies to both, written and electronic prescriptions.  NOTE: The following applies primarily to controlled substances (Opioid* Pain Medications).   Patient's responsibilities: 1. Pain Pills: Bring all pain pills to every appointment (except for procedure appointments). 2. Pill Bottles: Bring pills in original pharmacy bottle. Always bring newest bottle. Bring bottle, even if empty. 3. Medication refills: You are responsible for knowing and keeping track of what medications you need refilled. The day before your appointment, write a list of all prescriptions that need to be refilled. Bring that list to your appointment and give it to the admitting nurse. Prescriptions will be written only during appointments. If you forget a medication, it will not be "Called in", "Faxed", or "electronically sent". You will need to get another appointment to get these prescribed. 4. Prescription Accuracy: You are responsible for carefully inspecting your prescriptions before leaving our office. Have the discharge nurse carefully go over each prescription with you, before taking them home. Make sure that your name is accurately spelled, that your address is correct. Check the name and dose of your medication to make sure it is accurate. Check the number of pills, and the written instructions to make sure they are clear and accurate. Make sure that you are given enough medication to last  until your next medication refill appointment. 5. Taking Medication: Take medication as prescribed. Never take more pills than instructed. Never take medication more frequently than prescribed. Taking less pills or less frequently is permitted and encouraged, when it comes to controlled substances (written prescriptions).  6. Inform other Doctors: Always inform, all of your healthcare providers, of all the medications you take. 7. Pain Medication from other Providers: You are not allowed to accept any additional pain medication from any other Doctor or Healthcare provider. There are two exceptions to this rule. (see below) In the event that you require additional pain medication, you are responsible for notifying us, as stated below. 8. Medication Agreement: You are responsible for carefully reading and following our Medication Agreement. This must be signed before receiving any prescriptions from our practice. Safely store a copy of your signed Agreement. Violations to the Agreement will result in no further prescriptions. (Additional copies of our Medication Agreement are available upon request.) 9. Laws, Rules, & Regulations: All patients are expected to follow all Federal and State Laws, Statutes, Rules, & Regulations. Ignorance of the Laws does not constitute a valid excuse. The use of any illegal substances is prohibited. 10. Adopted CDC guidelines & recommendations: Target dosing levels will be at or below 60 MME/day. Use of benzodiazepines** is not recommended.  Exceptions: There are only two exceptions to the rule of not receiving pain medications from other Healthcare Providers. 1. Exception #1 (Emergencies): In the event of an emergency (i.e.: accident requiring emergency care), you are allowed to receive additional pain medication. However, you are responsible for: As soon as you are able, call our office [phone], at any time of the day or night, and leave a message stating your name, the  date and nature of the emergency, and the name and dose of the medication   prescribed. In the event that your call is answered by a member of our staff, make sure to document and save the date, time, and the name of the person that took your information.  2. Exception #2 (Planned Surgery): In the event that you are scheduled by another doctor or dentist to have any type of surgery or procedure, you are allowed (for a period no longer than 30 days), to receive additional pain medication, for the acute post-op pain. However, in this case, you are responsible for picking up a copy of our "Post-op Pain Management for Surgeons" handout, and giving it to your surgeon or dentist. This document is available at our office, and does not require an appointment to obtain it. Simply go to our office during business hours (Monday-Thursday from 8:00 AM to 4:00 PM) (Friday 8:00 AM to 12:00 Noon) or if you have a scheduled appointment with us, prior to your surgery, and ask for it by name. In addition, you will need to provide us with your name, name of your surgeon, type of surgery, and date of procedure or surgery.  *Opioid medications include: morphine, codeine, oxycodone, oxymorphone, hydrocodone, hydromorphone, meperidine, tramadol, tapentadol, buprenorphine, fentanyl, methadone. **Benzodiazepine medications include: diazepam (Valium), alprazolam (Xanax), clonazepam (Klonopine), lorazepam (Ativan), clorazepate (Tranxene), chlordiazepoxide (Librium), estazolam (Prosom), oxazepam (Serax), temazepam (Restoril), triazolam (Halcion) (Last updated: 07/19/2017) ____________________________________________________________________________________________   BMI Assessment: Estimated body mass index is 30.23 kg/m as calculated from the following:   Height as of this encounter: 5\' 1"  (1.549 m).   Weight as of this encounter: 160 lb (72.6 kg).  BMI interpretation table: BMI level Category Range association with higher  incidence of chronic pain  <18 kg/m2 Underweight   18.5-24.9 kg/m2 Ideal body weight   25-29.9 kg/m2 Overweight Increased incidence by 20%  30-34.9 kg/m2 Obese (Class I) Increased incidence by 68%  35-39.9 kg/m2 Severe obesity (Class II) Increased incidence by 136%  >40 kg/m2 Extreme obesity (Class III) Increased incidence by 254%   Patient's current BMI Ideal Body weight  Body mass index is 30.23 kg/m. Ideal body weight: 47.8 kg (105 lb 6.1 oz) Adjusted ideal body weight: 57.7 kg (127 lb 3.7 oz)   BMI Readings from Last 4 Encounters:  04/03/18 30.23 kg/m  01/07/18 34.77 kg/m  01/01/18 30.23 kg/m  12/10/17 34.39 kg/m   Wt Readings from Last 4 Encounters:  04/03/18 160 lb (72.6 kg)  01/07/18 184 lb (83.5 kg)  01/01/18 160 lb (72.6 kg)  12/10/17 182 lb (82.6 kg)    Plantar Fasciitis Plantar fasciitis is a painful foot condition that affects the heel. It occurs when the band of tissue that connects the toes to the heel bone (plantar fascia) becomes irritated. This can happen after exercising too much or doing other repetitive activities (overuse injury). The pain from plantar fasciitis can range from mild irritation to severe pain that makes it difficult for you to walk or move. The pain is usually worse in the morning or after you have been sitting or lying down for a while. What are the causes? This condition may be caused by:  Standing for long periods of time.  Wearing shoes that do not fit.  Doing high-impact activities, including running, aerobics, and ballet.  Being overweight.  Having an abnormal way of walking (gait).  Having tight calf muscles.  Having high arches in your feet.  Starting a new athletic activity.  What are the signs or symptoms? The main symptom of this condition is heel pain. Other symptoms  include:  Pain that gets worse after activity or exercise.  Pain that is worse in the morning or after resting.  Pain that goes away after you walk  for a few minutes.  How is this diagnosed? This condition may be diagnosed based on your signs and symptoms. Your health care provider will also do a physical exam to check for:  A tender area on the bottom of your foot.  A high arch in your foot.  Pain when you move your foot.  Difficulty moving your foot.  You may also need to have imaging studies to confirm the diagnosis. These can include:  X-rays.  Ultrasound.  MRI.  How is this treated? Treatment for plantar fasciitis depends on the severity of the condition. Your treatment may include:  Rest, ice, and over-the-counter pain medicines to manage your pain. (ALEVE Twice a day for 7 days )  Exercises to stretch your calves and your plantar fascia.  A splint that holds your foot in a stretched, upward position while you sleep (night splint).  Physical therapy to relieve symptoms and prevent problems in the future.  Cortisone injections to relieve severe pain.  Extracorporeal shock wave therapy (ESWT) to stimulate damaged plantar fascia with electrical impulses. It is often used as a last resort before surgery.  Surgery, if other treatments have not worked after 12 months.  Follow these instructions at home:  Take medicines only as directed by your health care provider.  Avoid activities that cause pain.  Roll the bottom of your foot over a bag of ice or a bottle of cold water. Do this for 20 minutes, 3-4 times a day.  Perform simple stretches as directed by your health care provider.  Try wearing athletic shoes with air-sole or gel-sole cushions or soft shoe inserts.  Wear a night splint while sleeping, if directed by your health care provider.  Keep all follow-up appointments with your health care provider. How is this prevented?  Do not perform exercises or activities that cause heel pain.  Consider finding low-impact activities if you continue to have problems.  Lose weight if you need to. The best way  to prevent plantar fasciitis is to avoid the activities that aggravate your plantar fascia. Contact a health care provider if:  Your symptoms do not go away after treatment with home care measures.  Your pain gets worse.  Your pain affects your ability to move or do your daily activities. This information is not intended to replace advice given to you by your health care provider. Make sure you discuss any questions you have with your health care provider. Document Released: 01/31/2001 Document Revised: 10/11/2015 Document Reviewed: 03/18/2014 [employer]vier Interactive Patient Education  2018 [employer].  Oxycodone to last until 07/05/2018 and Lyrica have been escribed to your pharmacy.

## 2018-04-03 NOTE — Progress Notes (Signed)
Patient's Name: Jennifer Rivers  MRN: [id number]  Referring Provider: Zeb Comfort, MD  DOB: [birth year]  PCP: Zeb Comfort, MD  DOS: 04/03/2018  Note by: Vevelyn Francois NP  Service setting: Ambulatory outpatient  Specialty: Interventional Pain Management  Location: [facility] (AMB) Pain Management Facility    Patient type: Established    Primary Reason(s) for Visit: Encounter for prescription drug management. (Level of risk: moderate)  CC: Joint Pain (arthritis); Knee Pain (right); and Foot Pain (right )  HPI  Jennifer Rivers is a 72 y.o. year old, female patient, who comes today for a medication management evaluation. She has Adult idiopathic generalized osteoporosis; Atypical migraine; COPD (chronic obstructive pulmonary disease) ([place]); Generalized OA; HTN, goal below 140/90; Current tear of meniscus; Major depressive disorder; Chronic knee pain (Primary Source of Pain) (Bilateral) (R>L); Long term current use of opiate analgesic; Long term prescription opiate use; Opiate use (20 MME/Day); Encounter for pain management planning; Encounter for therapeutic drug level monitoring; Chronic low back pain (Secondary source of pain) (Bilateral) (L>R); Tricompartmental knee arthropathy (Bilateral); Chronic upper back pain (between shoulder blades) (Bilateral) (R>L); Chronic shoulder pain [facility] source of pain) (Bilateral) (L>R); 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); Chronic lower extremity pain (Bilateral) (R>L); Chronic lower extremity radicular pain (Bilateral) (R>L); MRSA (methicillin resistant staph aureus) culture positive; Radicular pain of shoulder; Chronic cervical radicular pain (C4/C5 dermatomes) (L); Elevated sed rate; Elevated C-reactive protein (CRP); Chronic pain syndrome; Osteoarthritis of knees (Bilateral); Tear of meniscus of knee; Hip pain, chronic, unspecified laterality (B) (R>L); Primary osteoarthritis of hips, bilateral; Spondylosis of lumbar spine; Lumbosacral facet joint  syndrome ([place]); Chronic Neurogenic pain; Hyponatremia; Health care maintenance; 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 Her primarily concern today is the Joint Pain (arthritis); Knee Pain (right); and Foot Pain (right )  Pain Assessment: Location: Left Knee(foot ) Radiating: pain in knee and foot pain, back of heel is so painful that she can barely walk on it. knee pain goes up into the hip  Onset: More than a month ago Duration: Chronic pain Quality: Constant, Discomfort, Numbness, Pressure Severity: 7 /10 (subjective, self-reported pain score)  Note: Reported level is compatible with observation. Clinically the patient looks like a 1/10 A 1/10 is viewed as "Mild" and described as nagging, annoying, but not interfering with basic activities of daily living (ADL). Jennifer Rivers is able to eat, bathe, get dressed, do toileting (being able to get on and off the toilet and perform personal hygiene functions), transfer (move in and out of bed or a chair without assistance), and maintain continence (able to control bladder and bowel functions). Physiologic parameters such as blood pressure and heart rate apear wnl.       When using our objective Pain Scale, levels between 6 and 10/10 are said to belong in an emergency room, as it progressively worsens from a 6/10, described as severely limiting, requiring emergency care not usually available at an outpatient pain management facility. At a 6/10 level, communication becomes difficult and requires great effort. Assistance to reach the emergency department may be required. Facial flushing and profuse sweating along with potentially dangerous increases in heart rate and blood pressure will be evident. Effect on ADL:  difficulty walking at present d/t heal pain. knee pain prevents her from walking very far feels as if it is  pulling. difficult to straighten has to keep elevated.  Timing: Constant Modifying factors: rest with heat or ice.  medications, sometimes nothing.  BP: 133/78  HR: 66  Jennifer Rivers was last scheduled for an appointment on 01/01/2018 for medication management. During today's appointment we reviewed Jennifer Rivers's chronic pain status, as well as her outpatient medication regimen.  She admits that she is having pain in her right heel for approximately 3 weeks.  She admits that is worse in the with the first step.  She continues throughout the day.  She denies a previous injury of fall.   The patient  reports that she does not use drugs. Her body mass index is 30.23 kg/m.  Further details on both, my assessment(s), as well as the proposed treatment plan, please see below.  Controlled Substance Pharmacotherapy Assessment REMS (Risk Evaluation and Mitigation Strategy)  Analgesic:Oxycodone IR 5 mg 1 tablet by mouth every 6 hours (20 mg/dayof oxycodone) MME/day:80m/day PJanett Billow RN  04/03/2018  9:42 AM  Sign at close encounter Nursing Pain Medication Assessment:  Safety precautions to be maintained throughout the outpatient stay will include: orient to surroundings, keep bed in low position, maintain call bell within reach at all times, provide assistance with transfer out of bed and ambulation.  Medication Inspection Compliance: Pill count conducted under aseptic conditions, in front of the patient. Neither the pills nor the bottle was removed from the patient's sight at any time. Once count was completed pills were immediately returned to the patient in their original bottle.  Medication: Oxycodone IR Pill/Patch Count: 8 of 120 pills remain Pill/Patch Appearance: Markings consistent with prescribed medication Bottle Appearance: Standard pharmacy container. Clearly  labeled. Filled Date: 10 / 17 / 2019 Last Medication intake:  Today   Pharmacokinetics: Liberation and absorption (onset of action): WNL Distribution (time to peak effect): WNL Metabolism and excretion (duration of action): WNL         Pharmacodynamics: Desired effects: Analgesia: Ms. RPatreports >50% benefit. Functional ability: Patient reports that medication allows her to accomplish basic ADLs Clinically meaningful improvement in function (CMIF): Sustained CMIF goals met Perceived effectiveness: Described as relatively effective, allowing for increase in activities of daily living (ADL) Undesirable effects: Side-effects or Adverse reactions: None reported Monitoring: [place] PMP: Online review of the past 152-montheriod conducted. Compliant with practice rules and regulations Last UDS on record: Summary  Date Value Ref Range Status  10/04/2017 FINAL  Final    Comment:    ==================================================================== TOXASSURE SELECT 13 (MW) ==================================================================== Test                             Result       Flag       Units Drug Present and Declared for Prescription Verification   Oxycodone                      315          EXPECTED   ng/mg creat   Oxymorphone                    274          EXPECTED   ng/mg creat   Noroxycodone                   598          EXPECTED   ng/mg creat    Sources  of oxycodone include scheduled prescription medications.    Oxymorphone and noroxycodone are expected metabolites of    oxycodone. Oxymorphone is also available as a scheduled    prescription medication. ==================================================================== Test                      Result    Flag   Units      Ref Range   Creatinine              54               mg/dL      >=20 ==================================================================== Declared Medications:  The flagging and interpretation on  this report are based on the  following declared medications.  Unexpected results may arise from  inaccuracies in the declared medications.  **Note: The testing scope of this panel includes these medications:  Oxycodone  **Note: The testing scope of this panel does not include following  reported medications:  Albuterol  Benzonatate  Furosemide  Losartan (Losartan Potassium)  Omeprazole  Pregabalin ==================================================================== For clinical consultation, please call [phone]. ====================================================================    UDS interpretation: Compliant          Medication Assessment Form: Reviewed. Patient indicates being compliant with therapy Treatment compliance: Compliant Risk Assessment Profile: Aberrant behavior: See prior evaluations. None observed or detected today Comorbid factors increasing risk of overdose: See prior notes. No additional risks detected today Opioid risk tool (ORT) (Total Score): 3 Personal History of Substance Abuse (SUD-Substance use disorder):  Alcohol: Negative  Illegal Drugs: Negative  Rx Drugs: Negative  ORT Risk Level calculation: Low Risk Risk of substance use disorder (SUD): Low Opioid Risk Tool - 04/03/18 0951      Family History of Substance Abuse   Alcohol  Negative    Illegal Drugs  Negative    Rx Drugs  Negative      Personal History of Substance Abuse   Alcohol  Negative    Illegal Drugs  Negative    Rx Drugs  Negative      Age   Age between 71-45 years   No      Psychological Disease   Psychological Disease  Positive    ADD  Negative    OCD  Negative    Bipolar  Negative    Schizophrenia  Negative    Depression  Positive      Total Score   Opioid Risk Tool Scoring  3    Opioid Risk Interpretation  Low Risk      ORT Scoring interpretation table:  Score <3 = Low Risk for SUD  Score between 4-7 = Moderate Risk for SUD  Score >8 = High Risk for  Opioid Abuse   Risk Mitigation Strategies:  Patient Counseling: Covered Patient-Prescriber Agreement (PPA): Present and active  Notification to other healthcare providers: Done  Pharmacologic Plan: No change in therapy, at this time.             Laboratory Chemistry  Inflammation Markers (CRP: Acute Phase) (ESR: Chronic Phase) Lab Results  Component Value Date   CRP 3.0 (H) 12/28/2015   ESRSEDRATE 55 (H) 12/28/2015                         Rheumatology Markers Lab Results  Component Value Date   RF 14.6 (H) 02/03/2016  Renal Function Markers Lab Results  Component Value Date   BUN 15 04/18/2017   CREATININE 0.79 04/18/2017   GFRAA >60 04/18/2017   GFRNONAA >60 04/18/2017                             Hepatic Function Markers Lab Results  Component Value Date   AST 24 12/28/2015   ALT 17 12/28/2015   ALBUMIN 4.2 12/28/2015   ALKPHOS 115 12/28/2015   LIPASE 33 03/10/2015                        Electrolytes Lab Results  Component Value Date   NA 129 (L) 04/18/2017   K 3.8 04/18/2017   CL 94 (L) 04/18/2017   CALCIUM 8.8 (L) 04/18/2017   MG 1.6 (L) 04/17/2017                        Neuropathy Markers Lab Results  Component Value Date   VITAMINB12 246 04/16/2017   FOLATE 8.7 04/16/2017   HGBA1C 5.6 03/11/2015                        CNS Tests No results found for: COLORCSF, APPEARCSF, RBCCOUNTCSF, WBCCSF, POLYSCSF, LYMPHSCSF, EOSCSF, PROTEINCSF, GLUCCSF, JCVIRUS, CSFOLI, IGGCSF                      Bone Pathology Markers Lab Results  Component Value Date   25OHVITD1 24 (L) 12/28/2015   25OHVITD2 2.5 12/28/2015   25OHVITD3 21 12/28/2015                         Coagulation Parameters Lab Results  Component Value Date   PLT 248 04/17/2017                        Cardiovascular Markers Lab Results  Component Value Date   BNP 188.0 (H) 04/16/2017   TROPONINI <0.03 04/16/2017   HGB 10.2 (L) 04/17/2017   HCT 29.9 (L) 04/17/2017                          CA Markers No results found for: CEA, CA125, LABCA2                      Note: Lab results reviewed.  Recent Diagnostic Imaging Results  DG Chest 2 View CLINICAL DATA:  Asthma.  Wheezing and coughing.  EXAM: CHEST - 2 VIEW  COMPARISON:  April 16, 2017  FINDINGS: Scarring in the lateral right lung base. The heart, hila, mediastinum, lungs, and pleura otherwise demonstrate no acute abnormality or change.  IMPRESSION: No active cardiopulmonary disease.  Electronically Signed   By: Dorise Bullion III M.D   On: 09/15/2017 13:26  Complexity Note: Imaging results reviewed. Results shared with Ms. Stann Mainland, using Layman's terms.                         Meds   Current Outpatient Medications:  .  albuterol (VENTOLIN HFA) 108 (90 BASE) MCG/ACT inhaler, Take 2 puffs by mouth every 6 (six) hours as needed., Disp: , Rfl:  .  atorvastatin (LIPITOR) 10 MG tablet, Take 10 mg by mouth daily., Disp: , Rfl:  .  carvedilol (COREG) 25  MG tablet, Take 25 mg by mouth 2 (two) times daily with a meal., Disp: , Rfl:  .  Fluticasone-Salmeterol (ADVAIR DISKUS IN), Inhale into the lungs., Disp: , Rfl:  .  furosemide (LASIX) 20 MG tablet, , Disp: , Rfl:  .  hydrochlorothiazide (MICROZIDE) 12.5 MG capsule, Take 12.5 mg by mouth daily., Disp: , Rfl:  .  olmesartan (BENICAR) 20 MG tablet, Take 20 mg by mouth daily., Disp: , Rfl:  .  omeprazole (PRILOSEC) 20 MG capsule, Take 20 mg by mouth daily. , Disp: , Rfl:  .  [START ON 06/05/2018] oxyCODONE (OXY IR/ROXICODONE) 5 MG immediate release tablet, Take 1 tablet (5 mg total) by mouth every 6 (six) hours as needed for severe pain., Disp: 120 tablet, Rfl: 0 .  [START ON 04/06/2018] pregabalin (LYRICA) 50 MG capsule, Take 1 capsule (50 mg total) by mouth 3 (three) times daily., Disp: 90 capsule, Rfl: 2 .  sertraline (ZOLOFT) 25 MG tablet, Take 50 mg by mouth daily., Disp: , Rfl:  .  [START ON 05/06/2018] oxyCODONE (OXY IR/ROXICODONE) 5  MG immediate release tablet, Take 1 tablet (5 mg total) by mouth every 6 (six) hours as needed for severe pain., Disp: 120 tablet, Rfl: 0 .  [START ON 04/06/2018] oxyCODONE (OXY IR/ROXICODONE) 5 MG immediate release tablet, Take 1 tablet (5 mg total) by mouth every 6 (six) hours as needed for severe pain., Disp: 120 tablet, Rfl: 0  ROS  Constitutional: Denies any fever or chills Gastrointestinal: No reported hemesis, hematochezia, vomiting, or acute GI distress Musculoskeletal: Denies any acute onset joint swelling, redness, loss of ROM, or weakness Neurological: No reported episodes of acute onset apraxia, aphasia, dysarthria, agnosia, amnesia, paralysis, loss of coordination, or loss of consciousness  Allergies  Ms. Lamarca is allergic to bupropion; codeine; gabapentin; tramadol; penicillins; and sulfa antibiotics.  [place]  Drug: Ms. Mccullers  reports that she does not use drugs. Alcohol:  reports that she does not drink alcohol. Tobacco:  reports that she quit smoking about 6 months ago. Her smoking use included cigarettes. She has a 2.50 pack-year smoking history. She has never used smokeless tobacco. Medical:  has a past medical history of Asthma, GERD (gastroesophageal reflux disease), Hypertension, Motion sickness, MRSA (methicillin resistant staph aureus) culture positive (08/2015), Multilevel degenerative disc disease, Osteoarthritis, and Osteoporosis. Surgical: Ms. Schoenberger  has a past surgical history that includes Abdominal hysterectomy; Tonsillectomy; Knee arthroscopy (Right, 09/15/2015); Cataract extraction w/PHACO (Left, 12/10/2017); and Cataract extraction w/PHACO (Right, 01/07/2018). Family: family history includes Diabetes in her mother; Heart attack in her father.  Constitutional Exam  General appearance: Well nourished, well developed, and well hydrated. In no apparent acute distress Vitals:   04/03/18 0932  BP: 133/78  Pulse: 66  Resp: 16  Temp: 98 F (36.7 C)  TempSrc: Oral   SpO2: 97%  Weight: 160 lb (72.6 kg)  Height: 5' 1" (1.549 m)  Psych/Mental status: Alert, oriented x 3 (person, place, & time)       Eyes: PERLA Respiratory: No evidence of acute respiratory distress  Upper Extremity (UE) Exam    Side: Right upper extremity  Side: Left upper extremity  Skin & Extremity Inspection: Skin color, temperature, and hair growth are WNL. No peripheral edema or cyanosis. No masses, redness, swelling, asymmetry, or associated skin lesions. No contractures.  Skin & Extremity Inspection: Edema  Functional ROM: Unrestricted ROM          Functional ROM: Adequate ROM for wrist and hand  Muscle Tone/Strength: Functionally  intact. No obvious neuro-muscular anomalies detected.  Muscle Tone/Strength: Functionally intact. No obvious neuro-muscular anomalies detected.  Sensory (Neurological): Unimpaired          Sensory (Neurological): Unimpaired          Palpation: No palpable anomalies              Palpation: No palpable anomalies                   Thoracic Spine Area Exam  Skin & Axial Inspection: No masses, redness, or swelling Alignment: Symmetrical Functional ROM: Unrestricted ROM Stability: No instability detected Muscle Tone/Strength: Functionally intact. No obvious neuro-muscular anomalies detected. Sensory (Neurological): Unimpaired Muscle strength & Tone: No palpable anomalies  Lumbar Spine Area Exam  Skin & Axial Inspection: No masses, redness, or swelling Alignment: Symmetrical Functional ROM: Unrestricted ROM       Stability: No instability detected Muscle Tone/Strength: Functionally intact. No obvious neuro-muscular anomalies detected. Sensory (Neurological): Unimpaired Palpation: No palpable anomalies        Gait & Posture Assessment  Ambulation: Unassisted Gait: Relatively normal for age and body habitus Posture: WNL   Lower Extremity Exam    Side: Right lower extremity  Side: Left lower extremity  Stability: No instability observed           Stability: No instability observed          Skin & Extremity Inspection: Skin color, temperature, and hair growth are WNL. No peripheral edema or cyanosis. No masses, redness, swelling, asymmetry, or associated skin lesions. No contractures.  Skin & Extremity Inspection: Skin color, temperature, and hair growth are WNL. No peripheral edema or cyanosis. No masses, redness, swelling, asymmetry, or associated skin lesions. No contractures.  Functional ROM: Adequate ROM        right foot and ankle          Functional ROM: Unrestricted ROM                  Muscle Tone/Strength: Functionally intact. No obvious neuro-muscular anomalies detected.  Muscle Tone/Strength: Functionally intact. No obvious neuro-muscular anomalies detected.  Sensory (Neurological): Unimpaired  Sensory (Neurological): Unimpaired  Palpation: No palpable anomalies  Palpation: No palpable anomalies   Assessment  Primary Diagnosis & Pertinent Problem List: The primary encounter diagnosis was Spondylosis of lumbar spine. Diagnoses of Lumbosacral facet joint syndrome (HCC), Lumbar foraminal stenosis (L4-5) (Right), Chronic pain syndrome, and Pain of right heel were also pertinent to this visit.  Status Diagnosis  Persistent Persistent Persistent 1. Spondylosis of lumbar spine   2. Lumbosacral facet joint syndrome ([place])   3. Lumbar foraminal stenosis (L4-5) (Right)   4. Chronic pain syndrome   5. Pain of right heel     Problems updated and reviewed during this visit: Problem  Pain of Right Heel   Plan of Care  Pharmacotherapy (Medications Ordered): Meds ordered this encounter  Medications  . oxyCODONE (OXY IR/ROXICODONE) 5 MG immediate release tablet    Sig: Take 1 tablet (5 mg total) by mouth every 6 (six) hours as needed for severe pain.    Dispense:  120 tablet    Refill:  0    Do not place this medication on "Automatic Refill". Patient may have prescription filled one day early if pharmacy is closed on scheduled  refill date.    Order Specific Question:   Supervising Provider    Answer:   Milinda Pointer [phone]  . oxyCODONE (OXY IR/ROXICODONE) 5 MG immediate release tablet  Sig: Take 1 tablet (5 mg total) by mouth every 6 (six) hours as needed for severe pain.    Dispense:  120 tablet    Refill:  0    Do not place this medication on "Automatic Refill". Patient may have prescription filled one day early if pharmacy is closed on scheduled refill date.    Order Specific Question:   Supervising Provider    Answer:   Milinda Pointer [phone]  . oxyCODONE (OXY IR/ROXICODONE) 5 MG immediate release tablet    Sig: Take 1 tablet (5 mg total) by mouth every 6 (six) hours as needed for severe pain.    Dispense:  120 tablet    Refill:  0    Do not place this medication on "Automatic Refill". Patient may have prescription filled one day early if pharmacy is closed on scheduled refill date.    Order Specific Question:   Supervising Provider    Answer:   Milinda Pointer [phone]  . pregabalin (LYRICA) 50 MG capsule    Sig: Take 1 capsule (50 mg total) by mouth 3 (three) times daily.    Dispense:  90 capsule    Refill:  2    Do not place this medication, or any other prescription from our practice, on "Automatic Refill". Patient may have prescription filled one day early if pharmacy is closed on scheduled refill date.    Order Specific Question:   Supervising Provider    Answer:   Milinda Pointer [[id number]]   New Prescriptions   No medications on file   Medications administered today: Trystin A. Lovins had no medications administered during this visit. Lab-work, procedure(s), and/or referral(s): No orders of the defined types were placed in this encounter.  Imaging and/or referral(s): None Planned, scheduled, and/or pending:  Possible Diagnostic right-sided L5-S1 transforaminal epidural steroid injection. patient to call.  May call for right heel injection for possible plantar fasciitis.     Considering:  Left cervical epidural steroid injection #2 Diagnostic bilateral intra-articular knee injection Possible series of 5 bilateral Hyalgan intra-articular knee injections Diagnostic bilateral genicular nerve blocks  Possible bilateral genicular nerve radiofrequency ablation Diagnostic bilateral lumbar facet block  Possible bilateral lumbar facet radiofrequency ablation  Diagnostic bilateral intra-articular shoulder joint injection  Diagnostic bilateral suprascapular nerve block  Possible bilateral suprascapular nerve radiofrequency ablation  Diagnostic right-sided L5-S1 transforaminal epidural steroid injection Diagnostic left sided S1 transforaminal epidural steroid injection Diagnostic right-sided L4-5 lumbar epidural steroid injection    Palliative PRN treatment(s):  Palliative bilateral intra-articular knee injection with local anesthetic and steroid. Left cervical epidural steroid injection #2    Provider-requested follow-up: Return in about 3 months (around 07/04/2018) for MedMgmt.  No future appointments. Primary Care Physician: Zeb Comfort, MD Location: [facility] Note by: Vevelyn Francois NP Date: 04/03/2018; Time: 10:34 AM  Pain Score Disclaimer: We use the NRS-11 scale. This is a self-reported, subjective measurement of pain severity with only modest accuracy. It is used primarily to identify changes within a particular patient. It must be understood that outpatient pain scales are significantly less accurate that those used for research, where they can be applied under ideal controlled circumstances with minimal exposure to variables. In reality, the score is likely to be a combination of pain intensity and pain affect, where pain affect describes the degree of emotional arousal or changes in action readiness caused by the sensory experience of pain. Factors such as social and work situation, setting, emotional state,  anxiety levels,  expectation, and prior pain experience may influence pain perception and show large inter-individual differences that may also be affected by time variables.  Patient instructions provided during this appointment: Patient Instructions   ____________________________________________________________________________________________  Medication Rules  Applies to: All patients receiving prescriptions (written or electronic).  Pharmacy of record: Pharmacy where electronic prescriptions will be sent. If written prescriptions are taken to a different pharmacy, please inform the nursing staff. The pharmacy listed in the electronic medical record should be the one where you would like electronic prescriptions to be sent.  Prescription refills: Only during scheduled appointments. Applies to both, written and electronic prescriptions.  NOTE: The following applies primarily to controlled substances (Opioid* Pain Medications).   Patient's responsibilities: 1. Pain Pills: Bring all pain pills to every appointment (except for procedure appointments). 2. Pill Bottles: Bring pills in original pharmacy bottle. Always bring newest bottle. Bring bottle, even if empty. 3. Medication refills: You are responsible for knowing and keeping track of what medications you need refilled. The day before your appointment, write a list of all prescriptions that need to be refilled. Bring that list to your appointment and give it to the admitting nurse. Prescriptions will be written only during appointments. If you forget a medication, it will not be "Called in", "Faxed", or "electronically sent". You will need to get another appointment to get these prescribed. 4. Prescription Accuracy: You are responsible for carefully inspecting your prescriptions before leaving our office. Have the discharge nurse carefully go over each prescription with you, before taking them home. Make sure that your name is accurately  spelled, that your address is correct. Check the name and dose of your medication to make sure it is accurate. Check the number of pills, and the written instructions to make sure they are clear and accurate. Make sure that you are given enough medication to last until your next medication refill appointment. 5. Taking Medication: Take medication as prescribed. Never take more pills than instructed. Never take medication more frequently than prescribed. Taking less pills or less frequently is permitted and encouraged, when it comes to controlled substances (written prescriptions).  6. Inform other Doctors: Always inform, all of your healthcare providers, of all the medications you take. 7. Pain Medication from other Providers: You are not allowed to accept any additional pain medication from any other Doctor or Healthcare provider. There are two exceptions to this rule. (see below) In the event that you require additional pain medication, you are responsible for notifying us, as stated below. 8. Medication Agreement: You are responsible for carefully reading and following our Medication Agreement. This must be signed before receiving any prescriptions from our practice. Safely store a copy of your signed Agreement. Violations to the Agreement will result in no further prescriptions. (Additional copies of our Medication Agreement are available upon request.) 9. Laws, Rules, & Regulations: All patients are expected to follow all Federal and [employer], [employer], Rules, [employer]. Ignorance of the Laws does not constitute a valid excuse. The use of any illegal substances is prohibited. 10. Adopted CDC guidelines & recommendations: Target dosing levels will be at or below 60 MME/day. Use of benzodiazepines** is not recommended.  Exceptions: There are only two exceptions to the rule of not receiving pain medications from other Healthcare Providers. 1. Exception #1 (Emergencies): In the event of an emergency  (i.e.: accident requiring emergency care), you are allowed to receive additional pain medication. However, you are responsible for: As soon as you are able, call our  office [phone], at any time of the day or night, and leave a message stating your name, the date and nature of the emergency, and the name and dose of the medication prescribed. In the event that your call is answered by a member of our staff, make sure to document and save the date, time, and the name of the person that took your information.  2. Exception #2 (Planned Surgery): In the event that you are scheduled by another doctor or dentist to have any type of surgery or procedure, you are allowed (for a period no longer than 30 days), to receive additional pain medication, for the acute post-op pain. However, in this case, you are responsible for picking up a copy of our "Post-op Pain Management for Surgeons" handout, and giving it to your surgeon or dentist. This document is available at our office, and does not require an appointment to obtain it. Simply go to our office during business hours (Monday-Thursday from 8:00 AM to 4:00 PM) (Friday 8:00 AM to 12:00 Noon) or if you have a scheduled appointment with [place], prior to your surgery, and ask for it by name. In addition, you will need to provide [place] with your name, name of your surgeon, type of surgery, and date of procedure or surgery.  *Opioid medications include: morphine, codeine, oxycodone, oxymorphone, hydrocodone, hydromorphone, meperidine, tramadol, tapentadol, buprenorphine, fentanyl, methadone. **Benzodiazepine medications include: diazepam (Valium), alprazolam (Xanax), clonazepam (Klonopine), lorazepam (Ativan), clorazepate (Tranxene), chlordiazepoxide (Librium), estazolam (Prosom), oxazepam (Serax), temazepam (Restoril), triazolam (Halcion) (Last updated: 07/19/2017) ____________________________________________________________________________________________   BMI  Assessment: Estimated body mass index is 30.23 kg/m as calculated from the following:   Height as of this encounter: 5' 1" (1.549 m).   Weight as of this encounter: 160 lb (72.6 kg).  BMI interpretation table: BMI level Category Range association with higher incidence of chronic pain  <18 kg/m2 Underweight   18.5-24.9 kg/m2 Ideal body weight   25-29.9 kg/m2 Overweight Increased incidence by 20%  30-34.9 kg/m2 Obese (Class I) Increased incidence by 68%  35-39.9 kg/m2 Severe obesity (Class II) Increased incidence by 136%  >40 kg/m2 Extreme obesity (Class III) Increased incidence by 254%   Patient's current BMI Ideal Body weight  Body mass index is 30.23 kg/m. Ideal body weight: 47.8 kg (105 lb 6.1 oz) Adjusted ideal body weight: 57.7 kg (127 lb 3.7 oz)   BMI Readings from Last 4 Encounters:  04/03/18 30.23 kg/m  01/07/18 34.77 kg/m  01/01/18 30.23 kg/m  12/10/17 34.39 kg/m   Wt Readings from Last 4 Encounters:  04/03/18 160 lb (72.6 kg)  01/07/18 184 lb (83.5 kg)  01/01/18 160 lb (72.6 kg)  12/10/17 182 lb (82.6 kg)    Plantar Fasciitis Plantar fasciitis is a painful foot condition that affects the heel. It occurs when the band of tissue that connects the toes to the heel bone (plantar fascia) becomes irritated. This can happen after exercising too much or doing other repetitive activities (overuse injury). The pain from plantar fasciitis can range from mild irritation to severe pain that makes it difficult for you to walk or move. The pain is usually worse in the morning or after you have been sitting or lying down for a while. What are the causes? This condition may be caused by:  Standing for long periods of time.  Wearing shoes that do not fit.  Doing high-impact activities, including running, aerobics, and ballet.  Being overweight.  Having an abnormal way of walking (gait).  Having tight  calf muscles.  Having high arches in your feet.  Starting a new  athletic activity.  What are the signs or symptoms? The main symptom of this condition is heel pain. Other symptoms include:  Pain that gets worse after activity or exercise.  Pain that is worse in the morning or after resting.  Pain that goes away after you walk for a few minutes.  How is this diagnosed? This condition may be diagnosed based on your signs and symptoms. Your health care provider will also do a physical exam to check for:  A tender area on the bottom of your foot.  A high arch in your foot.  Pain when you move your foot.  Difficulty moving your foot.  You may also need to have imaging studies to confirm the diagnosis. These can include:  X-rays.  Ultrasound.  MRI.  How is this treated? Treatment for plantar fasciitis depends on the severity of the condition. Your treatment may include:  Rest, ice, and over-the-counter pain medicines to manage your pain. (ALEVE Twice a day for 7 days )  Exercises to stretch your calves and your plantar fascia.  A splint that holds your foot in a stretched, upward position while you sleep (night splint).  Physical therapy to relieve symptoms and prevent problems in the future.  Cortisone injections to relieve severe pain.  Extracorporeal shock wave therapy (ESWT) to stimulate damaged plantar fascia with electrical impulses. It is often used as a last resort before surgery.  Surgery, if other treatments have not worked after 12 months.  Follow these instructions at home:  Take medicines only as directed by your health care provider.  Avoid activities that cause pain.  Roll the bottom of your foot over a bag of ice or a bottle of cold water. Do this for 20 minutes, 3-4 times a day.  Perform simple stretches as directed by your health care provider.  Try wearing athletic shoes with air-sole or gel-sole cushions or soft shoe inserts.  Wear a night splint while sleeping, if directed by your health care  provider.  Keep all follow-up appointments with your health care provider. How is this prevented?  Do not perform exercises or activities that cause heel pain.  Consider finding low-impact activities if you continue to have problems.  Lose weight if you need to. The best way to prevent plantar fasciitis is to avoid the activities that aggravate your plantar fascia. Contact a health care provider if:  Your symptoms do not go away after treatment with home care measures.  Your pain gets worse.  Your pain affects your ability to move or do your daily activities. This information is not intended to replace advice given to you by your health care provider. Make sure you discuss any questions you have with your health care provider. Document Released: 01/31/2001 Document Revised: 10/11/2015 Document Reviewed: 03/18/2014 Elsevier Interactive Patient Education  [employer].

## 2018-04-03 NOTE — Progress Notes (Signed)
Nursing Pain Medication Assessment:  Safety precautions to be maintained throughout the outpatient stay will include: orient to surroundings, keep bed in low position, maintain call bell within reach at all times, provide assistance with transfer out of bed and ambulation.  Medication Inspection Compliance: Pill count conducted under aseptic conditions, in front of the patient. Neither the pills nor the bottle was removed from the patient's sight at any time. Once count was completed pills were immediately returned to the patient in their original bottle.  Medication: Oxycodone IR Pill/Patch Count: 8 of 120 pills remain Pill/Patch Appearance: Markings consistent with prescribed medication Bottle Appearance: Standard pharmacy container. Clearly labeled. Filled Date: 10 / 17 / 2019 Last Medication intake:  Today

## 2018-04-08 ENCOUNTER — Emergency Department
Admission: EM | Admit: 2018-04-08 | Discharge: 2018-04-08 | Disposition: A | Discharge status: Home / Self Care | Payer: Medicare HMO | Attending: Emergency Medicine | Admitting: Emergency Medicine

## 2018-04-08 ENCOUNTER — Other Ambulatory Visit: Payer: Self-pay

## 2018-04-08 DIAGNOSIS — I10 Essential (primary) hypertension: Secondary | ICD-10-CM | POA: Diagnosis not present

## 2018-04-08 DIAGNOSIS — K047 Periapical abscess without sinus: Secondary | ICD-10-CM

## 2018-04-08 DIAGNOSIS — Z87891 Personal history of nicotine dependence: Secondary | ICD-10-CM | POA: Diagnosis not present

## 2018-04-08 DIAGNOSIS — Z79899 Other long term (current) drug therapy: Secondary | ICD-10-CM | POA: Diagnosis not present

## 2018-04-08 DIAGNOSIS — E860 Dehydration: Secondary | ICD-10-CM

## 2018-04-08 DIAGNOSIS — J449 Chronic obstructive pulmonary disease, unspecified: Secondary | ICD-10-CM | POA: Insufficient documentation

## 2018-04-08 DIAGNOSIS — B349 Viral infection, unspecified: Secondary | ICD-10-CM

## 2018-04-08 DIAGNOSIS — R531 Weakness: Secondary | ICD-10-CM | POA: Diagnosis present

## 2018-04-08 LAB — CBC
HEMATOCRIT: 34.4 % — AB (ref 36.0–46.0)
Hemoglobin: 11.1 g/dL — ABNORMAL LOW (ref 12.0–15.0)
MCH: 27.5 pg (ref 26.0–34.0)
MCHC: 32.3 g/dL (ref 30.0–36.0)
MCV: 85.1 fL (ref 80.0–100.0)
NRBC: 0 % (ref 0.0–0.2)
Platelets: 226 10*3/uL (ref 150–400)
RBC: 4.04 MIL/uL (ref 3.87–5.11)
RDW: 14.7 % (ref 11.5–15.5)
WBC: 8 10*3/uL (ref 4.0–10.5)

## 2018-04-08 LAB — BASIC METABOLIC PANEL
ANION GAP: 8 (ref 5–15)
BUN: 17 mg/dL (ref 8–23)
CHLORIDE: 97 mmol/L — AB (ref 98–111)
CO2: 28 mmol/L (ref 22–32)
Calcium: 9 mg/dL (ref 8.9–10.3)
Creatinine, Ser: 1.5 mg/dL — ABNORMAL HIGH (ref 0.44–1.00)
GFR calc non Af Amer: 34 mL/min — ABNORMAL LOW (ref >60)
GFR, EST AFRICAN AMERICAN: 40 mL/min — AB (ref >60)
Glucose, Bld: 110 mg/dL — ABNORMAL HIGH (ref 70–99)
Potassium: 4.7 mmol/L (ref 3.5–5.1)
SODIUM: 133 mmol/L — AB (ref 135–145)

## 2018-04-08 LAB — URINALYSIS, COMPLETE (UACMP) WITH MICROSCOPIC
BACTERIA UA: NONE SEEN
Bilirubin Urine: NEGATIVE
Glucose, UA: NEGATIVE mg/dL
Hgb urine dipstick: NEGATIVE
Ketones, ur: NEGATIVE mg/dL
NITRITE: NEGATIVE
Protein, ur: NEGATIVE mg/dL
SPECIFIC GRAVITY, URINE: 1.004 — AB (ref 1.005–1.030)
pH: 6 (ref 5.0–8.0)

## 2018-04-08 LAB — HEPATIC FUNCTION PANEL
ALK PHOS: 103 U/L (ref 38–126)
ALT: 17 U/L (ref 0–44)
AST: 22 U/L (ref 15–41)
Albumin: 3.9 g/dL (ref 3.5–5.0)
Bilirubin, Direct: <0.1 mg/dL (ref 0.0–0.2)
TOTAL PROTEIN: 7.5 g/dL (ref 6.5–8.1)
Total Bilirubin: 0.5 mg/dL (ref 0.3–1.2)

## 2018-04-08 LAB — LIPASE, BLOOD: LIPASE: 19 U/L (ref 11–51)

## 2018-04-08 MED ORDER — CLINDAMYCIN HCL 150 MG PO CAPS: 450.0000 mg | ORAL_CAPSULE | Freq: Three times a day (TID) | ORAL | 0 refills | Status: DC

## 2018-04-08 MED ORDER — FAMOTIDINE IN NACL 20-0.9 MG/50ML-% IV SOLN
INTRAVENOUS | Status: AC
  Administered 2018-04-08: 20 mg via INTRAVENOUS
  Filled 2018-04-08: qty 50

## 2018-04-08 MED ORDER — FAMOTIDINE 20 MG PO TABS: 20.0000 mg | ORAL_TABLET | Freq: Two times a day (BID) | ORAL | 0 refills | Status: AC

## 2018-04-08 MED ORDER — ONDANSETRON HCL 4 MG/2ML IJ SOLN
4.0000 mg | Freq: Once | INTRAMUSCULAR | Status: AC
  Administered 2018-04-08: 4 mg via INTRAVENOUS
  Filled 2018-04-08: qty 2

## 2018-04-08 MED ORDER — SODIUM CHLORIDE 0.9 % IV BOLUS
1000.0000 mL | Freq: Once | INTRAVENOUS | Status: AC
  Administered 2018-04-08: 1000 mL via INTRAVENOUS

## 2018-04-08 MED ORDER — ONDANSETRON 4 MG PO TBDP: 4.0000 mg | ORAL_TABLET | Freq: Three times a day (TID) | ORAL | 0 refills | Status: AC | PRN

## 2018-04-08 MED ORDER — FAMOTIDINE IN NACL 20-0.9 MG/50ML-% IV SOLN
20.0000 mg | Freq: Once | INTRAVENOUS | Status: AC
  Administered 2018-04-08: 20 mg via INTRAVENOUS

## 2018-04-08 NOTE — ED Notes (Signed)
Pt able to tolerate food and water  

## 2018-04-08 NOTE — ED Triage Notes (Signed)
Pt c/o n/v and sore throat that started last night, states today she is so weak she can hardly walk. Denies fever or diarrhea. Pt is in NAD at present.

## 2018-04-08 NOTE — ED Notes (Signed)
Pt given crackers and water. 

## 2018-04-08 NOTE — ED Provider Notes (Signed)
[facility] Emergency Department Provider Note  ____________________________________________  Time seen: Approximately 7:56 PM  I have reviewed the triage vital signs and the nursing notes.   HISTORY  Chief Complaint Weakness    HPI KEMIA WENDEL is a 72 y.o. female with a history of asthma GERD hypertension who complains of  sore throat, fatigue, and myalgias that started last night.  She had one episode of vomiting as well.  No fever diarrhea or constipation.  No chest pain or shortness of breath.  No black or bloody stool or emesis.  Symptoms are waxing and waning, no aggravating or alleviating factors.    Past Medical History:  Diagnosis Date  . Asthma   . GERD (gastroesophageal reflux disease)   . Hypertension   . Motion sickness    cars  . MRSA (methicillin resistant staph aureus) culture positive 08/2015  . Multilevel degenerative disc disease   . Osteoarthritis   . Osteoporosis      Patient Active Problem List   Diagnosis Date Noted  . Pain of right heel 04/03/2018  . Anxiety disorder 01/01/2018  . Disorder of skeletal system 11/07/2017  . Pharmacologic therapy 11/07/2017  . Problems influencing health status 11/07/2017  . Lumbar foraminal stenosis (L4-5) (Right) 05/30/2017  . 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 Hyponatremia 04/16/2017  . Health care maintenance 03/14/2017  . Chronic Neurogenic pain 01/08/2017  . Hip pain, chronic, unspecified laterality (B) (R>L) 07/17/2016  . Primary osteoarthritis of hips, bilateral 07/17/2016  . 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 Lumbosacral facet joint syndrome (HCC) 07/17/2016  . Osteoarthritis of knees (Bilateral) 04/06/2016  . Elevated sed rate 02/03/2016  . Elevated C-reactive protein (CRP) 02/03/2016  . Chronic knee pain (Primary Source of Pain) (Bilateral) (R>L) 12/28/2015  . Long term current use of opiate analgesic 12/28/2015  . Long term prescription opiate  use 12/28/2015  . Opiate use (20 MME/Day) 12/28/2015  . Encounter for pain management planning 12/28/2015  . Encounter for therapeutic drug level monitoring 12/28/2015  . Chronic low back pain (Secondary source of pain) (Bilateral) (L>R) 12/28/2015  . Tricompartmental knee arthropathy (Bilateral) 12/28/2015  . Chronic upper back pain (between shoulder blades) (Bilateral) (R>L) 12/28/2015  . Chronic shoulder pain [facility] source of pain) (Bilateral) (L>R) 12/28/2015  . 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 Chronic lower extremity pain (Bilateral) (R>L) 12/28/2015  . Chronic lower extremity radicular pain (Bilateral) (R>L) 12/28/2015  . MRSA (methicillin resistant staph aureus) culture positive 12/28/2015  . Radicular pain of shoulder 12/28/2015  . Chronic cervical radicular pain (C4/C5 dermatomes) (L) 12/28/2015  . Current tear of meniscus 08/23/2015  . Tear of meniscus of knee 08/23/2015  . 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 Atypical migraine 07/14/2015  . COPD (chronic obstructive pulmonary disease) (HCC) 07/14/2015  . Generalized OA 07/14/2015  . HTN, goal below 140/90 07/14/2015  . Major depressive disorder 07/14/2015  . Chronic pain syndrome 10/13/2014  . Mixed hyperlipidemia 03/15/2010  . Gastroesophageal reflux disease 03/23/2009     Past Surgical History:  Procedure Laterality Date  . ABDOMINAL HYSTERECTOMY    . CATARACT EXTRACTION W/PHACO Left 12/10/2017   Procedure: CATARACT EXTRACTION PHACO AND INTRAOCULAR LENS PLACEMENT (IOC)  LEFT;  Surgeon: Nevada Crane, MD;  Location[facility];  Service: Ophthalmology;  Laterality: Left;  . CATARACT EXTRACTION W/PHACO Right 01/07/2018   Procedure: CATARACT EXTRACTION PHACO AND INTRAOCULAR LENS PLACEMENT (IOC) RIGHT;  Surgeon: Nevada Crane, MD;  Location:  [facility];  Service: Ophthalmology;  Laterality: Right;  . KNEE ARTHROSCOPY Right 09/15/2015   Procedure:  ARTHROSCOPY KNEE partial medial and lateral menisectomy, chondroplasty;  Surgeon: Erin SonsHarold Kernodle, MD;  Location: [facility];  Service: Orthopedics;  Laterality: Right;  . TONSILLECTOMY       Prior to Admission medications   Medication Sig Start Date End Date Taking? Authorizing Provider  albuterol (VENTOLIN HFA) 108 (90 BASE) MCG/ACT inhaler Take 2 puffs by mouth every 6 (six) hours as needed. 01/09/15   [provider]  atorvastatin (LIPITOR) 10 MG tablet Take 10 mg by mouth daily.    [provider]  carvedilol (COREG) 25 MG tablet Take 25 mg by mouth 2 (two) times daily with a meal.    [provider]  clindamycin (CLEOCIN) 150 MG capsule Take 3 capsules (450 mg total) by mouth 3 (three) times daily. 04/08/18   Sharman CheekStafford, Artemisia Auvil, MD  famotidine (PEPCID) 20 MG tablet Take 1 tablet (20 mg total) by mouth 2 (two) times daily. 04/08/18   Sharman CheekStafford, Omare Bilotta, MD  Fluticasone-Salmeterol (ADVAIR DISKUS IN) Inhale into the lungs.    [provider]  furosemide (LASIX) 20 MG tablet  05/23/17   [provider]  hydrochlorothiazide (MICROZIDE) 12.5 MG capsule Take 12.5 mg by mouth daily.    [provider]  olmesartan (BENICAR) 20 MG tablet Take 20 mg by mouth daily. 03/07/18 03/07/19  [provider]  omeprazole (PRILOSEC) 20 MG capsule Take 20 mg by mouth daily.     [provider]  ondansetron (ZOFRAN ODT) 4 MG disintegrating tablet Take 1 tablet (4 mg total) by mouth every 8 (eight) hours as needed for nausea or vomiting. 04/08/18   Sharman CheekStafford, Dyllan Kats, MD  oxyCODONE (OXY IR/ROXICODONE) 5 MG immediate release tablet Take 1 tablet (5 mg total) by mouth every 6 (six) hours as needed for severe pain. 06/05/18 07/05/18  Barbette MerinoKing, Crystal M, NP  oxyCODONE (OXY IR/ROXICODONE) 5 MG immediate release tablet Take 1 tablet (5 mg total) by mouth every 6 (six) hours as needed for severe pain. 05/06/18 06/05/18  Barbette MerinoKing, Crystal M, NP  oxyCODONE (OXY  IR/ROXICODONE) 5 MG immediate release tablet Take 1 tablet (5 mg total) by mouth every 6 (six) hours as needed for severe pain. 04/06/18 05/06/18  Barbette MerinoKing, Crystal M, NP  pregabalin (LYRICA) 50 MG capsule Take 1 capsule (50 mg total) by mouth 3 (three) times daily. 04/06/18 07/05/18  Barbette MerinoKing, Crystal M, NP  sertraline (ZOLOFT) 25 MG tablet Take 50 mg by mouth daily. 03/07/18   [provider]     Allergies Bupropion; Codeine; Gabapentin; Tramadol; Penicillins; and Sulfa antibiotics   Family History  Problem Relation Age of Onset  . Diabetes Mother   . Heart attack Father     Social History Social History   Tobacco Use  . Smoking status: Former Smoker    Packs/day: 0.25    Years: 10.00    Pack years: 2.50    Types: Cigarettes    Last attempt to quit: 09/04/2017    Years since quitting: 0.5  . Smokeless tobacco: Never Used  Substance Use Topics  . Alcohol use: No    Alcohol/week: 0.0 standard drinks  . Drug use: No    Review of Systems  Constitutional:   No fever or chills.  ENT:   Positive sore throat. No rhinorrhea. Cardiovascular:   No chest pain or syncope. Respiratory:   No dyspnea positive nonproductive cough. Gastrointestinal:   Positive for upper abdominal  pain and vomiting.  No diarrhea or constipation.   Musculoskeletal:   Negative for focal pain or swelling All other systems reviewed and are negative except as documented above in ROS and HPI.  ____________________________________________   PHYSICAL EXAM:  VITAL SIGNS: ED Triage Vitals  Enc Vitals Group     BP 04/08/18 1318 111/63     Pulse Rate 04/08/18 1318 86     Resp 04/08/18 1318 17     Temp 04/08/18 1318 98.7 F (37.1 C)     Temp Source 04/08/18 1318 Oral     SpO2 04/08/18 1318 94 %     Weight 04/08/18 1320 160 lb (72.6 kg)     Height 04/08/18 1320 5\' 1"  (1.549 m)     Head Circumference --      Peak Flow --      Pain Score 04/08/18 1319 7     Pain Loc --      Pain Edu? --      Excl. in  GC? --     Vital signs reviewed, nursing assessments reviewed.   Constitutional:   Alert and oriented. Non-toxic appearance. Eyes:   Conjunctivae are normal. EOMI. PERRL. ENT      Head:   Normocephalic and atraumatic.      Nose:   No congestion/rhinnorhea.       Mouth/Throat:   Dry mucous membranes, no pharyngeal erythema. No peritonsillar mass.       Neck:   No meningismus. Full ROM. Hematological/Lymphatic/Immunilogical:   No cervical lymphadenopathy. Cardiovascular:   RRR. Symmetric bilateral radial and DP pulses.  No murmurs. Cap refill less than 2 seconds. Respiratory:   Normal respiratory effort without tachypnea/retractions. Breath sounds are clear and equal bilaterally. No wheezes/rales/rhonchi. Gastrointestinal:   Soft with generalized tenderness, nonfocal, mild.  Non distended. There is no CVA tenderness.  No rebound, rigidity, or guarding.  Musculoskeletal:   Normal range of motion in all extremities. No joint effusions.  No lower extremity tenderness.  No edema. Neurologic:   Normal speech and language.  Motor grossly intact. No acute focal neurologic deficits are appreciated.  Skin:    Skin is warm, dry and intact. No rash noted.  No petechiae, purpura, or bullae.  ____________________________________________    LABS (pertinent positives/negatives) (all labs ordered are listed, but only abnormal results are displayed) Labs Reviewed  BASIC METABOLIC PANEL - Abnormal; Notable for the following components:      Result Value   Sodium 133 (*)    Chloride 97 (*)    Glucose, Bld 110 (*)    Creatinine, Ser 1.50 (*)    GFR calc non Af Amer 34 (*)    GFR calc Af Amer 40 (*)    All other components within normal limits  CBC - Abnormal; Notable for the following components:   Hemoglobin 11.1 (*)    HCT 34.4 (*)    All other components within normal limits  URINALYSIS, COMPLETE (UACMP) WITH MICROSCOPIC - Abnormal; Notable for the following components:   Color, Urine STRAW  (*)    APPearance CLEAR (*)    Specific Gravity, Urine 1.004 (*)    Leukocytes, UA TRACE (*)    All other components within normal limits  HEPATIC FUNCTION PANEL  LIPASE, BLOOD   ____________________________________________   EKG  Interpreted by me Normal sinus rhythm rate of 86, normal axis and intervals.  Normal QRS and ST segments.  Voltage criteria for LVH in the high lateral leads.  Diffuse slight T  wave abnormality, unchanged compared to November 2018.  No acute ischemic changes.  ____________________________________________    RADIOLOGY  No results found.  ____________________________________________   PROCEDURES Procedures  ____________________________________________  DIFFERENTIAL DIAGNOSIS   Viral syndrome, gastritis, dehydration, electrolyte disturbance  CLINICAL IMPRESSION / ASSESSMENT AND PLAN / ED COURSE  Pertinent labs & imaging results that were available during my care of the patient were reviewed by me and considered in my medical decision making (see chart for details).    Patient presents with multiple symptoms consistent with a viral illness and gastritis.Considering the patient's symptoms, medical history, and physical examination today, I have low suspicion for cholecystitis or biliary pathology, pancreatitis, perforation or bowel obstruction, hernia, intra-abdominal abscess, AAA or dissection, volvulus or intussusception, mesenteric ischemia, or appendicitis.    ----------------------------------------- 7:58 PM on 04/08/2018 -----------------------------------------  After IV fluids Zofran and Pepcid, patient feels much better.  She is walking up and down the hallways.  She is tolerating oral intake.  She agrees with discharge home.  She has a follow-up appointment already with her primary care doctor in 3 days.  Return precautions discussed.  Vital signs are normal.  I will prescribe her clindamycin for her dental infection given her allergy  profile.  No signs of secondary space infection or airway compromise.  Good condition.      ____________________________________________   FINAL CLINICAL IMPRESSION(S) / ED DIAGNOSES    Final diagnoses:  Infected tooth  Viral syndrome  Dehydration     ED Discharge Orders         Ordered    clindamycin (CLEOCIN) 150 MG capsule  3 times daily     04/08/18 1955    ondansetron (ZOFRAN ODT) 4 MG disintegrating tablet  Every 8 hours PRN     04/08/18 1955    famotidine (PEPCID) 20 MG tablet  2 times daily     04/08/18 1955          Portions of this note were generated with dragon dictation software. Dictation errors may occur despite best attempts at proofreading.    Sharman Cheek, MD 04/08/18 2001

## 2018-04-08 NOTE — ED Notes (Signed)
Pt c/o having nausea and feeling weak since last night, states she had an episode of vomiting last night. Pt has not been able to urinate since arrival to the ED. Pt also c/o left lower tooth pain, states it has been bothering her for a couple of days.

## 2018-04-08 NOTE — Discharge Instructions (Addendum)
Drink extra water to stay hydrated, and take Pepcid and Zofran to control your nausea and vomiting symptoms.  Take clindamycin to help with your tooth infection.  Follow-up with your doctor this Thursday as scheduled.

## 2018-06-15 ENCOUNTER — Emergency Department
Admission: EM | Admit: 2018-06-15 | Discharge: 2018-06-15 | Disposition: A | Discharge status: Home / Self Care | Payer: Medicare Other | Attending: Emergency Medicine | Admitting: Emergency Medicine

## 2018-06-15 ENCOUNTER — Other Ambulatory Visit: Payer: Self-pay

## 2018-06-15 DIAGNOSIS — K1120 Sialoadenitis, unspecified: Secondary | ICD-10-CM | POA: Diagnosis not present

## 2018-06-15 DIAGNOSIS — J45909 Unspecified asthma, uncomplicated: Secondary | ICD-10-CM | POA: Insufficient documentation

## 2018-06-15 DIAGNOSIS — Z79899 Other long term (current) drug therapy: Secondary | ICD-10-CM | POA: Diagnosis not present

## 2018-06-15 DIAGNOSIS — I10 Essential (primary) hypertension: Secondary | ICD-10-CM | POA: Insufficient documentation

## 2018-06-15 DIAGNOSIS — J449 Chronic obstructive pulmonary disease, unspecified: Secondary | ICD-10-CM | POA: Insufficient documentation

## 2018-06-15 DIAGNOSIS — Z87891 Personal history of nicotine dependence: Secondary | ICD-10-CM | POA: Diagnosis not present

## 2018-06-15 DIAGNOSIS — K1379 Other lesions of oral mucosa: Secondary | ICD-10-CM | POA: Diagnosis present

## 2018-06-15 DIAGNOSIS — K120 Recurrent oral aphthae: Secondary | ICD-10-CM | POA: Diagnosis not present

## 2018-06-15 MED ORDER — MAGIC MOUTHWASH W/LIDOCAINE: 5.0000 mL | Freq: Four times a day (QID) | ORAL | 0 refills | Status: DC

## 2018-06-15 MED ORDER — DOXYCYCLINE HYCLATE 100 MG PO TABS: 100.0000 mg | ORAL_TABLET | Freq: Two times a day (BID) | ORAL | 0 refills | Status: DC

## 2018-06-15 NOTE — ED Provider Notes (Signed)
[facility] Emergency Department Provider Note  ____________________________________________  Time seen: Approximately 7:56 PM  I have reviewed the triage vital signs and the nursing notes.   HISTORY  Chief Complaint Sore Throat and Mouth Lesions    HPI Jennifer Rivers is a 73 y.o. female who presents emergency department for a few complaints.  Patient reports that she had had some viral URI symptoms and has had a lingering scratchy throat for approximately a week to 10 days.  Patient is also noticed that she has a "spot" on the left side of her tongue that is painful for the same amount of time.  Patient's main complaint at this time however is a swollen, painful area under the left mandibular region.  She denies any fevers or chills, nasal congestion, cough, domino pain, nausea or vomiting.  Patient has a history of asthma, GERD, hypertension, osteoarthritis, neurogenic pain.  No complaints of chronic medical problems at this time.    Past Medical History:  Diagnosis Date  . Asthma   . GERD (gastroesophageal reflux disease)   . Hypertension   . Motion sickness    cars  . MRSA (methicillin resistant staph aureus) culture positive 08/2015  . Multilevel degenerative disc disease   . Osteoarthritis   . Osteoporosis     Patient Active Problem List   Diagnosis Date Noted  . Pain of right heel 04/03/2018  . Anxiety disorder 01/01/2018  . Disorder of skeletal system 11/07/2017  . Pharmacologic therapy 11/07/2017  . Problems influencing health status 11/07/2017  . Lumbar foraminal stenosis (L4-5) (Right) 05/30/2017  . 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 Hyponatremia 04/16/2017  . Health care maintenance 03/14/2017  . Chronic Neurogenic pain 01/08/2017  . Hip pain, chronic, unspecified laterality (B) (R>L) 07/17/2016  . Primary osteoarthritis of hips, bilateral 07/17/2016  . 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 Lumbosacral facet  joint syndrome (HCC) 07/17/2016  . Osteoarthritis of knees (Bilateral) 04/06/2016  . Elevated sed rate 02/03/2016  . Elevated C-reactive protein (CRP) 02/03/2016  . Chronic knee pain (Primary Source of Pain) (Bilateral) (R>L) 12/28/2015  . Long term current use of opiate analgesic 12/28/2015  . Long term prescription opiate use 12/28/2015  . Opiate use (20 MME/Day) 12/28/2015  . Encounter for pain management planning 12/28/2015  . Encounter for therapeutic drug level monitoring 12/28/2015  . Chronic low back pain (Secondary source of pain) (Bilateral) (L>R) 12/28/2015  . Tricompartmental knee arthropathy (Bilateral) 12/28/2015  . Chronic upper back pain (between shoulder blades) (Bilateral) (R>L) 12/28/2015  . Chronic shoulder pain [facility] source of pain) (Bilateral) (L>R) 12/28/2015  . 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 Chronic lower extremity pain (Bilateral) (R>L) 12/28/2015  . Chronic lower extremity radicular pain (Bilateral) (R>L) 12/28/2015  . MRSA (methicillin resistant staph aureus) culture positive 12/28/2015  . Radicular pain of shoulder 12/28/2015  . Chronic cervical radicular pain (C4/C5 dermatomes) (L) 12/28/2015  . Current tear of meniscus 08/23/2015  . Tear of meniscus of knee 08/23/2015  . 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 Atypical migraine 07/14/2015  . COPD (chronic obstructive pulmonary disease) (HCC) 07/14/2015  . Generalized OA 07/14/2015  . HTN, goal below 140/90 07/14/2015  . Major depressive disorder 07/14/2015  . Chronic pain syndrome 10/13/2014  . Mixed hyperlipidemia 03/15/2010  . Gastroesophageal reflux disease 03/23/2009    Past Surgical History:  Procedure Laterality Date  . ABDOMINAL HYSTERECTOMY    . CATARACT EXTRACTION W/PHACO Left 12/10/2017   Procedure:  CATARACT EXTRACTION PHACO AND INTRAOCULAR LENS PLACEMENT (IOC)  LEFT;  Surgeon: Nevada Crane, MD;  Location: [facility];  Service:  Ophthalmology;  Laterality: Left;  . CATARACT EXTRACTION W/PHACO Right 01/07/2018   Procedure: CATARACT EXTRACTION PHACO AND INTRAOCULAR LENS PLACEMENT (IOC) RIGHT;  Surgeon: Nevada Crane, MD;  Location: [facility];  Service: Ophthalmology;  Laterality: Right;  . KNEE ARTHROSCOPY Right 09/15/2015   Procedure: ARTHROSCOPY KNEE partial medial and lateral menisectomy, chondroplasty;  Surgeon: Erin Sons, MD;  Location: [facility];  Service: Orthopedics;  Laterality: Right;  . TONSILLECTOMY      Prior to Admission medications   Medication Sig Start Date End Date Taking? Authorizing Provider  albuterol (VENTOLIN HFA) 108 (90 BASE) MCG/ACT inhaler Take 2 puffs by mouth every 6 (six) hours as needed. 01/09/15   [provider]  atorvastatin (LIPITOR) 10 MG tablet Take 10 mg by mouth daily.    [provider]  carvedilol (COREG) 25 MG tablet Take 25 mg by mouth 2 (two) times daily with a meal.    [provider]  clindamycin (CLEOCIN) 150 MG capsule Take 3 capsules (450 mg total) by mouth 3 (three) times daily. 04/08/18   Sharman Cheek, MD  doxycycline (VIBRA-TABS) 100 MG tablet Take 1 tablet (100 mg total) by mouth 2 (two) times daily. 06/15/18   Cuthriell, Delorise Royals, PA-C  famotidine (PEPCID) 20 MG tablet Take 1 tablet (20 mg total) by mouth 2 (two) times daily. 04/08/18   Sharman Cheek, MD  Fluticasone-Salmeterol (ADVAIR DISKUS IN) Inhale into the lungs.    [provider]  furosemide (LASIX) 20 MG tablet  05/23/17   [provider]  hydrochlorothiazide (MICROZIDE) 12.5 MG capsule Take 12.5 mg by mouth daily.    [provider]  magic mouthwash w/lidocaine SOLN Take 5 mLs by mouth 4 (four) times daily. 06/15/18   Cuthriell, Delorise Royals, PA-C  olmesartan (BENICAR) 20 MG tablet Take 20 mg by mouth daily. 03/07/18 03/07/19  [provider]  omeprazole (PRILOSEC) 20 MG capsule Take 20 mg by mouth daily.     [provider]  ondansetron (ZOFRAN ODT) 4 MG disintegrating tablet Take 1 tablet (4 mg total) by mouth every 8 (eight) hours as needed for nausea or vomiting. 04/08/18   Sharman Cheek, MD  oxyCODONE (OXY IR/ROXICODONE) 5 MG immediate release tablet Take 1 tablet (5 mg total) by mouth every 6 (six) hours as needed for severe pain. 06/05/18 07/05/18  Barbette Merino, NP  oxyCODONE (OXY IR/ROXICODONE) 5 MG immediate release tablet Take 1 tablet (5 mg total) by mouth every 6 (six) hours as needed for severe pain. 05/06/18 06/05/18  Barbette Merino, NP  oxyCODONE (OXY IR/ROXICODONE) 5 MG immediate release tablet Take 1 tablet (5 mg total) by mouth every 6 (six) hours as needed for severe pain. 04/06/18 05/06/18  Barbette Merino, NP  pregabalin (LYRICA) 50 MG capsule Take 1 capsule (50 mg total) by mouth 3 (three) times daily. 04/06/18 07/05/18  Barbette Merino, NP  sertraline (ZOLOFT) 25 MG tablet Take 50 mg by mouth daily. 03/07/18   [provider]    Allergies Bupropion; Codeine; Gabapentin; Tramadol; Penicillins; and Sulfa antibiotics  Family History  Problem Relation Age of Onset  . Diabetes Mother   . Heart attack Father     Social History Social History   Tobacco Use  . Smoking status: Former Smoker    Packs/day: 0.25    Years: 10.00  Pack years: 2.50    Types: Cigarettes    Last attempt to quit: 09/04/2017    Years since quitting: 0.7  . Smokeless tobacco: Never Used  Substance Use Topics  . Alcohol use: No    Alcohol/week: 0.0 standard drinks  . Drug use: No     Review of Systems  Constitutional: No fever/chills Eyes: No visual changes. No discharge ENT: Positive for scratchy throat, left tongue sore.  Positive for painful lesion under the left mandible Cardiovascular: no chest pain. Respiratory: no cough. No SOB. Gastrointestinal: No abdominal pain.  No nausea, no vomiting.  Musculoskeletal: Negative for musculoskeletal pain. Skin: Negative for rash,  abrasions, lacerations, ecchymosis. Neurological: Negative for headaches, focal weakness or numbness. 10-point ROS otherwise negative.  ____________________________________________   PHYSICAL EXAM:  VITAL SIGNS: ED Triage Vitals [06/15/18 1917]  Enc Vitals Group     BP (!) 173/80     Pulse Rate 74     Resp 20     Temp 98.4 F (36.9 C)     Temp Source Oral     SpO2 96 %     Weight 160 lb (72.6 kg)     Height 5\' 1"  (1.549 m)     Head Circumference      Peak Flow      Pain Score 7     Pain Loc      Pain Edu?      Excl. in GC?      Constitutional: Alert and oriented. Well appearing and in no acute distress. Eyes: Conjunctivae are normal. PERRL. EOMI. Head: Atraumatic.  Palpation along the submandibular region left jawline reveals palpable lesion consistent with sial adenitis.  This area is tender to palpation.  No overlying skin changes.  No other findings to the face. ENT:      Ears: EACs and TMs unremarkable bilaterally.      Nose: No congestion/rhinnorhea.      Mouth/Throat: Mucous membranes are moist.  Visualization of the oropharynx reveals no gross erythema or edema.  Uvula is midline.  Tonsils are unremarkable.  Patient does have ulcer to the left side of the tongue consistent with an aphthous ulcer.  No other significant intraoral findings. Neck: No stridor.  Neck is supple full range of motion Hematological/Lymphatic/Immunilogical: No cervical lymphadenopathy. Cardiovascular: Normal rate, regular rhythm. Normal S1 and S2.  Good peripheral circulation. Respiratory: Normal respiratory effort without tachypnea or retractions. Lungs CTAB. Good air entry to the bases with no decreased or absent breath sounds. Musculoskeletal: Full range of motion to all extremities. No gross deformities appreciated. Neurologic:  Normal speech and language. No gross focal neurologic deficits are appreciated.  Skin:  Skin is warm, dry and intact. No rash noted. Psychiatric: Mood and affect  are normal. Speech and behavior are normal. Patient exhibits appropriate insight and judgement.   ____________________________________________   LABS (all labs ordered are listed, but only abnormal results are displayed)  Labs Reviewed - No data to display ____________________________________________  EKG   ____________________________________________  RADIOLOGY   No results found.  ____________________________________________    PROCEDURES  Procedure(s) performed:    Procedures    Medications - No data to display   ____________________________________________   INITIAL IMPRESSION / ASSESSMENT AND PLAN / ED COURSE  Pertinent labs & imaging results that were available during my care of the patient were reviewed by me and considered in my medical decision making (see chart for details).  Review of the [place] CSRS was performed in accordance of  the [facility] prior to dispensing any controlled drugs.      Patient's diagnosis is consistent with sial adenitis and aphthous ulcer of the tongue.  Patient presents emergency department complaining of multiple complaints.  Findings on exam are consistent with sialoadenitis to the left submandibular region.  Patient also has what appears to be an aphthous ulcer to the left tongue.  No other significant findings.  No indication for labs or imaging at this time.  Patient will be treated with doxycyline, Magic mouthwash for symptoms.  Patient is encouraged to use sour candy to improve sial adenitis.  Follow-up with primary care as needed. Patient is given ED precautions to return to the ED for any worsening or new symptoms.     ____________________________________________  FINAL CLINICAL IMPRESSION(S) / ED DIAGNOSES  Final diagnoses:  Sialoadenitis  Aphthous ulcer of tongue      NEW MEDICATIONS STARTED DURING THIS VISIT:  ED Discharge Orders         Ordered    magic mouthwash w/lidocaine SOLN  4 times daily     06/15/18  2016    doxycycline (VIBRA-TABS) 100 MG tablet  2 times daily     06/15/18 2016              This chart was dictated using voice recognition software/Dragon. Despite best efforts to proofread, errors can occur which can change the meaning. Any change was purely unintentional.    Racheal PatchesCuthriell, Jonathan D, PA-C 06/15/18 2018    Sharman CheekStafford, Phillip, MD 06/17/18 [phone]

## 2018-06-15 NOTE — ED Triage Notes (Signed)
Patient reports she is here because she is hoarse, lymph nodes in throat are swollen and "I have sores on my tongue".

## 2018-06-25 ENCOUNTER — Ambulatory Visit: Payer: Medicare Other | Attending: Nurse Practitioner | Admitting: Nurse Practitioner

## 2018-06-25 ENCOUNTER — Encounter: Payer: Self-pay | Admitting: Nurse Practitioner

## 2018-06-25 ENCOUNTER — Other Ambulatory Visit: Payer: Self-pay

## 2018-06-25 VITALS — BP 173/80 | HR 65 | Temp 98.2°F | Resp 16 | Ht 61.0 in | Wt 160.0 lb

## 2018-06-25 DIAGNOSIS — G894 Chronic pain syndrome: Secondary | ICD-10-CM | POA: Insufficient documentation

## 2018-06-25 DIAGNOSIS — Z789 Other specified health status: Secondary | ICD-10-CM | POA: Diagnosis present

## 2018-06-25 DIAGNOSIS — M899 Disorder of bone, unspecified: Secondary | ICD-10-CM | POA: Diagnosis present

## 2018-06-25 DIAGNOSIS — M25559 Pain in unspecified hip: Secondary | ICD-10-CM | POA: Diagnosis present

## 2018-06-25 DIAGNOSIS — Z79891 Long term (current) use of opiate analgesic: Secondary | ICD-10-CM | POA: Insufficient documentation

## 2018-06-25 DIAGNOSIS — Z79899 Other long term (current) drug therapy: Secondary | ICD-10-CM | POA: Diagnosis present

## 2018-06-25 DIAGNOSIS — M549 Dorsalgia, unspecified: Secondary | ICD-10-CM

## 2018-06-25 DIAGNOSIS — G8929 Other chronic pain: Secondary | ICD-10-CM | POA: Diagnosis present

## 2018-06-25 DIAGNOSIS — M47816 Spondylosis without myelopathy or radiculopathy, lumbar region: Secondary | ICD-10-CM | POA: Insufficient documentation

## 2018-06-25 MED ORDER — OXYCODONE HCL 5 MG PO TABS: 5.0000 mg | ORAL_TABLET | Freq: Four times a day (QID) | ORAL | 0 refills | Status: DC | PRN

## 2018-06-25 MED ORDER — PREGABALIN 50 MG PO CAPS: 50.0000 mg | ORAL_CAPSULE | Freq: Three times a day (TID) | ORAL | 2 refills | Status: DC

## 2018-06-25 NOTE — Patient Instructions (Signed)
____________________________________________________________________________________________  Medication Rules  Purpose: To inform patients, and their family members, of our rules and regulations.  Applies to: All patients receiving prescriptions (written or electronic).  Pharmacy of record: Pharmacy where electronic prescriptions will be sent. If written prescriptions are taken to a different pharmacy, please inform the nursing staff. The pharmacy listed in the electronic medical record should be the one where you would like electronic prescriptions to be sent.  Electronic prescriptions: In compliance with the [place] Strengthen Opioid Misuse Prevention (STOP) Act of 2017 (Session Law 2017-74/H243), effective May 22, 2018, all controlled substances must be electronically prescribed. Calling prescriptions to the pharmacy will cease to exist.  Prescription refills: Only during scheduled appointments. Applies to all prescriptions.  NOTE: The following applies primarily to controlled substances (Opioid* Pain Medications).   Patient's responsibilities: 1. Pain Pills: Bring all pain pills to every appointment (except for procedure appointments). 2. Pill Bottles: Bring pills in original pharmacy bottle. Always bring the newest bottle. Bring bottle, even if empty. 3. Medication refills: You are responsible for knowing and keeping track of what medications you take and those you need refilled. The day before your appointment: write a list of all prescriptions that need to be refilled. The day of the appointment: give the list to the admitting nurse. Prescriptions will be written only during appointments. If you forget a medication: it will not be "Called in", "Faxed", or "electronically sent". You will need to get another appointment to get these prescribed. No early refills. Do not call asking to have your prescription filled early. 4. Prescription Accuracy: You are responsible for  carefully inspecting your prescriptions before leaving our office. Have the discharge nurse carefully go over each prescription with you, before taking them home. Make sure that your name is accurately spelled, that your address is correct. Check the name and dose of your medication to make sure it is accurate. Check the number of pills, and the written instructions to make sure they are clear and accurate. Make sure that you are given enough medication to last until your next medication refill appointment. 5. Taking Medication: Take medication as prescribed. When it comes to controlled substances, taking less pills or less frequently than prescribed is permitted and encouraged. Never take more pills than instructed. Never take medication more frequently than prescribed.  6. Inform other Doctors: Always inform, all of your healthcare providers, of all the medications you take. 7. Pain Medication from other Providers: You are not allowed to accept any additional pain medication from any other Doctor or Healthcare provider. There are two exceptions to this rule. (see below) In the event that you require additional pain medication, you are responsible for notifying us, as stated below. 8. Medication Agreement: You are responsible for carefully reading and following our Medication Agreement. This must be signed before receiving any prescriptions from our practice. Safely store a copy of your signed Agreement. Violations to the Agreement will result in no further prescriptions. (Additional copies of our Medication Agreement are available upon request.) 9. Laws, Rules, & Regulations: All patients are expected to follow all Federal and State Laws, Statutes, Rules, & Regulations. Ignorance of the Laws does not constitute a valid excuse. The use of any illegal substances is prohibited. 10. Adopted CDC guidelines & recommendations: Target dosing levels will be at or below 60 MME/day. Use of benzodiazepines** is not  recommended.  Exceptions: There are only two exceptions to the rule of not receiving pain medications from other Healthcare Providers. 1.   Exception #1 (Emergencies): In the event of an emergency (i.e.: accident requiring emergency care), you are allowed to receive additional pain medication. However, you are responsible for: As soon as you are able, call our office [phone], at any time of the day or night, and leave a message stating your name, the date and nature of the emergency, and the name and dose of the medication prescribed. In the event that your call is answered by a member of our staff, make sure to document and save the date, time, and the name of the person that took your information.  2. Exception #2 (Planned Surgery): In the event that you are scheduled by another doctor or dentist to have any type of surgery or procedure, you are allowed (for a period no longer than 30 days), to receive additional pain medication, for the acute post-op pain. However, in this case, you are responsible for picking up a copy of our "Post-op Pain Management for Surgeons" handout, and giving it to your surgeon or dentist. This document is available at our office, and does not require an appointment to obtain it. Simply go to our office during business hours (Monday-Thursday from 8:00 AM to 4:00 PM) (Friday 8:00 AM to 12:00 Noon) or if you have a scheduled appointment with us, prior to your surgery, and ask for it by name. In addition, you will need to provide us with your name, name of your surgeon, type of surgery, and date of procedure or surgery.  *Opioid medications include: morphine, codeine, oxycodone, oxymorphone, hydrocodone, hydromorphone, meperidine, tramadol, tapentadol, buprenorphine, fentanyl, methadone. **Benzodiazepine medications include: diazepam (Valium), alprazolam (Xanax), clonazepam (Klonopine), lorazepam (Ativan), clorazepate (Tranxene), chlordiazepoxide (Librium), estazolam (Prosom),  oxazepam (Serax), temazepam (Restoril), triazolam (Halcion) (Last updated: 07/19/2017) ____________________________________________________________________________________________    

## 2018-06-25 NOTE — Progress Notes (Signed)
Nursing Pain Medication Assessment:  Safety precautions to be maintained throughout the outpatient stay will include: orient to surroundings, keep bed in low position, maintain call bell within reach at all times, provide assistance with transfer out of bed and ambulation.  Medication Inspection Compliance: Pill count conducted under aseptic conditions, in front of the patient. Neither the pills nor the bottle was removed from the patient's sight at any time. Once count was completed pills were immediately returned to the patient in their original bottle.  Medication: Oxycodone IR Pill/Patch Count: 41 of 120 pills remain Pill/Patch Appearance: Markings consistent with prescribed medication Bottle Appearance: Standard pharmacy container. Clearly labeled. Filled Date: 1 / 60 / 2020 Last Medication intake:  Today

## 2018-06-25 NOTE — Progress Notes (Signed)
Patient's Name: Jennifer Rivers  MRN: [id number]  Referring Provider: Zeb Comfort, MD  DOB: 01-Feb-1946  PCP: Zeb Comfort, MD  DOS: 06/25/2018  Note by: Vevelyn Francois NP  Service setting: Ambulatory outpatient  Specialty: Interventional Pain Management  Location: [facility] (AMB) Pain Management Facility    Patient type: Established    Primary Reason(s) for Visit: Encounter for prescription drug management. (Level of risk: moderate)  CC: Back Pain (mid); Neck Pain (radiates to shoulders bilaterally); and Knee Pain (right)  HPI  Jennifer Rivers is a 73 y.o. year old, female patient, who comes today for a medication management evaluation. She has Adult idiopathic generalized osteoporosis; Atypical migraine; COPD (chronic obstructive pulmonary disease) ([place]); Generalized OA; HTN, goal below 140/90; Current tear of meniscus; Major depressive disorder; Chronic knee pain (Primary Source of Pain) (Bilateral) (R>L); Long term current use of opiate analgesic; Long term prescription opiate use; Opiate use (20 MME/Day); Encounter for pain management planning; Encounter for therapeutic drug level monitoring; Chronic low back pain (Secondary source of pain) (Bilateral) (L>R); Tricompartmental knee arthropathy (Bilateral); Chronic upper back pain (between shoulder blades) (Bilateral) (R>L); Chronic shoulder pain [facility] source of pain) (Bilateral) (L>R); 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); Chronic lower extremity pain (Bilateral) (R>L); Chronic lower extremity radicular pain (Bilateral) (R>L); MRSA (methicillin resistant staph aureus) culture positive; Radicular pain of shoulder; Chronic cervical radicular pain (C4/C5 dermatomes) (L); Elevated sed rate; Elevated C-reactive protein (CRP); Chronic pain syndrome; Osteoarthritis of knees (Bilateral); Tear of meniscus of knee; Hip pain, chronic, unspecified laterality (B) (R>L); Primary osteoarthritis of hips, bilateral; Spondylosis of lumbar spine; Lumbosacral  facet joint syndrome ([place]); Chronic Neurogenic pain; Hyponatremia; Health care maintenance; 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 Her primarily concern today is the Back Pain (mid); Neck Pain (radiates to shoulders bilaterally); and Knee Pain (right)  Pain Assessment: Location: Mid Back Radiating: hips bilateral down back of legs to knees. right worse Onset: More than a month ago Duration: Chronic pain Quality: Aching(feels like it is pulling apart) Severity: 7 /10 (subjective, self-reported pain score)  Note: Reported level is compatible with observation. Clinically the patient looks like a 2/10 A 1/10 is viewed as "Mild" and described as nagging, annoying, but not interfering with basic activities of daily living (ADL). Jennifer Rivers is able to eat, bathe, get dressed, do toileting (being able to get on and off the toilet and perform personal hygiene functions), transfer (move in and out of bed or a chair without assistance), and maintain continence (able to control bladder and bowel functions). Physiologic parameters such as blood pressure and heart rate apear wnl. Jennifer Rivers continues to use a standard subjective pain scale, rather than an objective pain scale as instructed. When using our objective Pain Scale, levels between 6 and 10/10 are said to belong in an emergency room, as it progressively worsens from a 6/10, described as severely limiting, requiring emergency care not usually available at an outpatient pain management facility. At a 6/10 level, communication becomes difficult and requires great effort. Assistance to reach the emergency department may be required. Facial flushing and profuse sweating along with potentially dangerous increases in heart  rate and blood pressure will be evident. Effect on ADL: prolonged walking, standing, bending, cleaning Timing:   Modifying factors: rest, medication,ice, heat BP: (!) 173/80  HR: 65  Jennifer Rivers was last scheduled for an appointment on 04/03/2018 for medication management. During today's appointment we reviewed Jennifer Rivers's chronic pain status, as well as her outpatient medication regimen.  The patient  reports no history of drug use. Her body mass index is 30.23 kg/m.  Further details on both, my assessment(s), as well as the proposed treatment plan, please see below.  Controlled Substance Pharmacotherapy Assessment REMS (Risk Evaluation and Mitigation Strategy)  Analgesic:Oxycodone IR 5 mg 1 tablet by mouth every 6 hours (20 mg/dayof oxycodone) MME/day:41m/day GIgnatius Specking RN  06/25/2018 11:30 AM  Sign when Signing Visit Nursing Pain Medication Assessment:  Safety precautions to be maintained throughout the outpatient stay will include: orient to surroundings, keep bed in low position, maintain call bell within reach at all times, provide assistance with transfer out of bed and ambulation.  Medication Inspection Compliance: Pill count conducted under aseptic conditions, in front of the patient. Neither the pills nor the bottle was removed from the patient's sight at any time. Once count was completed pills were immediately returned to the patient in their original bottle.  Medication: Oxycodone IR Pill/Patch Count: 41 of 120 pills remain Pill/Patch Appearance: Markings consistent with prescribed medication Bottle Appearance: Standard pharmacy container. Clearly labeled. Filled Date: 1 / 168/ 2020 Last Medication intake:  Today   Pharmacokinetics: Liberation and absorption (onset of action): WNL Distribution (time to peak effect): WNL Metabolism and excretion (duration of action): WNL         Pharmacodynamics: Desired effects: Analgesia: Ms. RLisbonreports >50%  benefit. Functional ability: Patient reports that medication allows her to accomplish basic ADLs Clinically meaningful improvement in function (CMIF): Sustained CMIF goals met Perceived effectiveness: Described as relatively effective, allowing for increase in activities of daily living (ADL) Undesirable effects: Side-effects or Adverse reactions: None reported Monitoring: [place] PMP: Online review of the past 123-montheriod conducted. Compliant with practice rules and regulations Last UDS on record: Summary  Date Value Ref Range Status  10/04/2017 FINAL  Final    Comment:    ==================================================================== TOXASSURE SELECT 13 (MW) ==================================================================== Test                             Result       Flag       Units Drug Present and Declared for Prescription Verification   Oxycodone                      315          EXPECTED   ng/mg creat   Oxymorphone                    274          EXPECTED   ng/mg creat   Noroxycodone                   598          EXPECTED   ng/mg creat    Sources of oxycodone include scheduled prescription medications.    Oxymorphone and noroxycodone are expected metabolites of    oxycodone. Oxymorphone is also available as a scheduled    prescription medication. ==================================================================== Test                      Result    Flag   Units      Ref Range   Creatinine  54               mg/dL      >=20 ==================================================================== Declared Medications:  The flagging and interpretation on this report are based on the  following declared medications.  Unexpected results may arise from  inaccuracies in the declared medications.  **Note: The testing scope of this panel includes these medications:  Oxycodone  **Note: The testing scope of this panel does not include following  reported  medications:  Albuterol  Benzonatate  Furosemide  Losartan (Losartan Potassium)  Omeprazole  Pregabalin ==================================================================== For clinical consultation, please call [phone]. ====================================================================    UDS interpretation: Compliant          Medication Assessment Form: Reviewed. Patient indicates being compliant with therapy Treatment compliance: Compliant Risk Assessment Profile: Aberrant behavior: See prior evaluations. None observed or detected today Comorbid factors increasing risk of overdose: See prior notes. No additional risks detected today Opioid risk tool (ORT) (Total Score): 0 Personal History of Substance Abuse (SUD-Substance use disorder):  Alcohol: Negative  Illegal Drugs: Negative  Rx Drugs: Negative  ORT Risk Level calculation: Low Risk Risk of substance use disorder (SUD): Low Opioid Risk Tool - 06/25/18 1126      Family History of Substance Abuse   Alcohol  Negative    Illegal Drugs  Negative    Rx Drugs  Negative      Personal History of Substance Abuse   Alcohol  Negative    Illegal Drugs  Negative    Rx Drugs  Negative      Age   Age between 97-45 years   No      Psychological Disease   Psychological Disease  Negative    Depression  Negative      Total Score   Opioid Risk Tool Scoring  0    Opioid Risk Interpretation  Low Risk      ORT Scoring interpretation table:  Score <3 = Low Risk for SUD  Score between 4-7 = Moderate Risk for SUD  Score >8 = High Risk for Opioid Abuse   Risk Mitigation Strategies:  Patient Counseling: Covered Patient-Prescriber Agreement (PPA): Present and active  Notification to other healthcare providers: Done  Pharmacologic Plan: No change in therapy, at this time.             Laboratory Chemistry  Inflammation Markers (CRP: Acute Phase) (ESR: Chronic Phase) Lab Results  Component Value Date   CRP 3.0 (H)  12/28/2015   ESRSEDRATE 55 (H) 12/28/2015                         Rheumatology Markers Lab Results  Component Value Date   RF 14.6 (H) 02/03/2016                        Renal Function Markers Lab Results  Component Value Date   BUN 17 04/08/2018   CREATININE 1.50 (H) 04/08/2018   GFRAA 40 (L) 04/08/2018   GFRNONAA 34 (L) 04/08/2018                             Hepatic Function Markers Lab Results  Component Value Date   AST 22 04/08/2018   ALT 17 04/08/2018   ALBUMIN 3.9 04/08/2018   ALKPHOS 103 04/08/2018   LIPASE 19 04/08/2018  Electrolytes Lab Results  Component Value Date   NA 133 (L) 04/08/2018   K 4.7 04/08/2018   CL 97 (L) 04/08/2018   CALCIUM 9.0 04/08/2018   MG 1.6 (L) 04/17/2017                        Neuropathy Markers Lab Results  Component Value Date   VITAMINB12 246 04/16/2017   FOLATE 8.7 04/16/2017   HGBA1C 5.6 03/11/2015                        CNS Tests No results found for: COLORCSF, APPEARCSF, RBCCOUNTCSF, WBCCSF, POLYSCSF, LYMPHSCSF, EOSCSF, PROTEINCSF, GLUCCSF, JCVIRUS, CSFOLI, IGGCSF                      Bone Pathology Markers Lab Results  Component Value Date   25OHVITD1 24 (L) 12/28/2015   25OHVITD2 2.5 12/28/2015   25OHVITD3 21 12/28/2015                         Coagulation Parameters Lab Results  Component Value Date   PLT 226 04/08/2018                        Cardiovascular Markers Lab Results  Component Value Date   BNP 188.0 (H) 04/16/2017   TROPONINI <0.03 04/16/2017   HGB 11.1 (L) 04/08/2018   HCT 34.4 (L) 04/08/2018                         CA Markers No results found for: CEA, CA125, LABCA2                      Endocrine Markers Lab Results  Component Value Date   TSH 6.451 (H) 04/16/2017   FREET4 1.21 (H) 04/17/2017                        Note: Lab results reviewed.  Recent Diagnostic Imaging Results  DG Chest 2 View CLINICAL DATA:  Asthma.  Wheezing and  coughing.  EXAM: CHEST - 2 VIEW  COMPARISON:  April 16, 2017  FINDINGS: Scarring in the lateral right lung base. The heart, hila, mediastinum, lungs, and pleura otherwise demonstrate no acute abnormality or change.  IMPRESSION: No active cardiopulmonary disease.  Electronically Signed   By: Dorise Bullion III M.D   On: 09/15/2017 13:26  Complexity Note: Imaging results reviewed. Results shared with Ms. Stann Mainland, using Layman's terms.                         Meds   Current Outpatient Medications:  .  albuterol (VENTOLIN HFA) 108 (90 BASE) MCG/ACT inhaler, Take 2 puffs by mouth every 6 (six) hours as needed., Disp: , Rfl:  .  atorvastatin (LIPITOR) 10 MG tablet, Take 10 mg by mouth daily., Disp: , Rfl:  .  carvedilol (COREG) 25 MG tablet, Take 25 mg by mouth 2 (two) times daily with a meal., Disp: , Rfl:  .  famotidine (PEPCID) 20 MG tablet, Take 1 tablet (20 mg total) by mouth 2 (two) times daily., Disp: 60 tablet, Rfl: 0 .  Fluticasone-Salmeterol (ADVAIR DISKUS IN), Inhale into the lungs., Disp: , Rfl:  .  furosemide (LASIX) 20 MG tablet, , Disp: , Rfl:  .  hydrochlorothiazide (MICROZIDE)  12.5 MG capsule, Take 12.5 mg by mouth daily., Disp: , Rfl:  .  olmesartan (BENICAR) 20 MG tablet, Take 20 mg by mouth daily., Disp: , Rfl:  .  omeprazole (PRILOSEC) 20 MG capsule, Take 20 mg by mouth daily. , Disp: , Rfl:  .  ondansetron (ZOFRAN ODT) 4 MG disintegrating tablet, Take 1 tablet (4 mg total) by mouth every 8 (eight) hours as needed for nausea or vomiting., Disp: 20 tablet, Rfl: 0 .  [START ON 09/03/2018] oxyCODONE (OXY IR/ROXICODONE) 5 MG immediate release tablet, Take 1 tablet (5 mg total) by mouth every 6 (six) hours as needed for up to 30 days for severe pain., Disp: 120 tablet, Rfl: 0 .  [START ON 07/05/2018] pregabalin (LYRICA) 50 MG capsule, Take 1 capsule (50 mg total) by mouth 3 (three) times daily., Disp: 90 capsule, Rfl: 2 .  sertraline (ZOLOFT) 25 MG tablet, Take 50 mg  by mouth daily., Disp: , Rfl:  .  [START ON 08/04/2018] oxyCODONE (OXY IR/ROXICODONE) 5 MG immediate release tablet, Take 1 tablet (5 mg total) by mouth every 6 (six) hours as needed for up to 30 days for severe pain., Disp: 120 tablet, Rfl: 0 .  [START ON 07/05/2018] oxyCODONE (OXY IR/ROXICODONE) 5 MG immediate release tablet, Take 1 tablet (5 mg total) by mouth every 6 (six) hours as needed for up to 30 days for severe pain., Disp: 120 tablet, Rfl: 0  ROS  Constitutional: Denies any fever or chills Gastrointestinal: No reported hemesis, hematochezia, vomiting, or acute GI distress Musculoskeletal: Denies any acute onset joint swelling, redness, loss of ROM, or weakness Neurological: No reported episodes of acute onset apraxia, aphasia, dysarthria, agnosia, amnesia, paralysis, loss of coordination, or loss of consciousness  Allergies  Ms. Shin is allergic to bupropion; codeine; gabapentin; tramadol; penicillins; and sulfa antibiotics.  [place]  Drug: Ms. Johann  reports no history of drug use. Alcohol:  reports no history of alcohol use. Tobacco:  reports that she quit smoking about 9 months ago. Her smoking use included cigarettes. She has a 2.50 pack-year smoking history. She has never used smokeless tobacco. Medical:  has a past medical history of Asthma, GERD (gastroesophageal reflux disease), Hypertension, Motion sickness, MRSA (methicillin resistant staph aureus) culture positive (08/2015), Multilevel degenerative disc disease, Osteoarthritis, Osteoporosis, and Throat infection. Surgical: Ms. Kocurek  has a past surgical history that includes Abdominal hysterectomy; Tonsillectomy; Knee arthroscopy (Right, 09/15/2015); Cataract extraction w/PHACO (Left, 12/10/2017); and Cataract extraction w/PHACO (Right, 01/07/2018). Family: family history includes Diabetes in her mother; Heart attack in her father.  Constitutional Exam  General appearance: Well nourished, well developed, and well hydrated. In  no apparent acute distress Vitals:   06/25/18 1117  BP: (!) 173/80  Pulse: 65  Resp: 16  Temp: 98.2 F (36.8 C)  SpO2: 97%  Weight: 160 lb (72.6 kg)  Height: 5' 1"  (1.549 m)  Psych/Mental status: Alert, oriented x 3 (person, place, & time)       Eyes: PERLA Respiratory: No evidence of acute respiratory distress  Cervical Spine Area Exam  Skin & Axial Inspection: No masses, redness, edema, swelling, or associated skin lesions Alignment: Symmetrical Functional ROM: Unrestricted ROM      Stability: No instability detected Muscle Tone/Strength: Functionally intact. No obvious neuro-muscular anomalies detected. Sensory (Neurological): Unimpaired Palpation: No palpable anomalies              Upper Extremity (UE) Exam    Side: Right upper extremity  Side: Left upper extremity  Skin &  Extremity Inspection: Skin color, temperature, and hair growth are WNL. No peripheral edema or cyanosis. No masses, redness, swelling, asymmetry, or associated skin lesions. No contractures.  Skin & Extremity Inspection: Skin color, temperature, and hair growth are WNL. No peripheral edema or cyanosis. No masses, redness, swelling, asymmetry, or associated skin lesions. No contractures.  Functional ROM: Unrestricted ROM          Functional ROM: Unrestricted ROM          Muscle Tone/Strength: Functionally intact. No obvious neuro-muscular anomalies detected.  Muscle Tone/Strength: Functionally intact. No obvious neuro-muscular anomalies detected.  Sensory (Neurological): Unimpaired          Sensory (Neurological): Unimpaired          Palpation: No palpable anomalies              Palpation: No palpable anomalies                   Thoracic Spine Area Exam  Skin & Axial Inspection: No masses, redness, or swelling Alignment: Symmetrical Functional ROM: Unrestricted ROM Stability: No instability detected Muscle Tone/Strength: Increased muscle tone over affected area Sensory (Neurological): Movement associated  pain Muscle strength & Tone: Tender  Lumbar Spine Area Exam  Skin & Axial Inspection: No masses, redness, or swelling Alignment: Symmetrical Functional ROM: Unrestricted ROM       Stability: No instability detected Muscle Tone/Strength: Functionally intact. No obvious neuro-muscular anomalies detected. Sensory (Neurological): Unimpaired Palpation: No palpable anomalies         Gait & Posture Assessment  Ambulation: Unassisted Gait: Relatively normal for age and body habitus Posture: WNL   Lower Extremity Exam    Side: Right lower extremity  Side: Left lower extremity  Stability: No instability observed          Stability: No instability observed          Skin & Extremity Inspection: Skin color, temperature, and hair growth are WNL. No peripheral edema or cyanosis. No masses, redness, swelling, asymmetry, or associated skin lesions. No contractures.  Skin & Extremity Inspection: Skin color, temperature, and hair growth are WNL. No peripheral edema or cyanosis. No masses, redness, swelling, asymmetry, or associated skin lesions. No contractures.  Functional ROM: Unrestricted ROM                  Functional ROM: Unrestricted ROM                  Muscle Tone/Strength: Functionally intact. No obvious neuro-muscular anomalies detected.  Muscle Tone/Strength: Functionally intact. No obvious neuro-muscular anomalies detected.  Sensory (Neurological): Unimpaired        Sensory (Neurological): Unimpaired                 Assessment  Primary Diagnosis & Pertinent Problem List: The primary encounter diagnosis was Chronic upper back pain (between shoulder blades) (Bilateral) (R>L). Diagnoses of Spondylosis of lumbar spine, Hip pain, chronic, unspecified laterality (B) (R>L), Chronic pain syndrome, Pharmacologic therapy, Problems influencing health status, Disorder of skeletal system, and Long term prescription opiate use were also pertinent to this visit.  Status Diagnosis   Persistent Persistent Persistent 1. Chronic upper back pain (between shoulder blades) (Bilateral) (R>L)   2. Spondylosis of lumbar spine   3. Hip pain, chronic, unspecified laterality (B) (R>L)   4. Chronic pain syndrome   5. Pharmacologic therapy   6. Problems influencing health status   7. Disorder of skeletal system   8. Long term prescription opiate  use     Problems updated and reviewed during this visit: No problems updated. Plan of Care  Pharmacotherapy (Medications Ordered): Meds ordered this encounter  Medications  . oxyCODONE (OXY IR/ROXICODONE) 5 MG immediate release tablet    Sig: Take 1 tablet (5 mg total) by mouth every 6 (six) hours as needed for up to 30 days for severe pain.    Dispense:  120 tablet    Refill:  0    Do not place this medication on "Automatic Refill". Patient may have prescription filled one day early if pharmacy is closed on scheduled refill date.    Order Specific Question:   Supervising Provider    Answer:   Milinda Pointer [phone]  . oxyCODONE (OXY IR/ROXICODONE) 5 MG immediate release tablet    Sig: Take 1 tablet (5 mg total) by mouth every 6 (six) hours as needed for up to 30 days for severe pain.    Dispense:  120 tablet    Refill:  0    Do not place this medication on "Automatic Refill". Patient may have prescription filled one day early if pharmacy is closed on scheduled refill date.    Order Specific Question:   Supervising Provider    Answer:   Milinda Pointer [phone]  . oxyCODONE (OXY IR/ROXICODONE) 5 MG immediate release tablet    Sig: Take 1 tablet (5 mg total) by mouth every 6 (six) hours as needed for up to 30 days for severe pain.    Dispense:  120 tablet    Refill:  0    Do not place this medication on "Automatic Refill". Patient may have prescription filled one day early if pharmacy is closed on scheduled refill date.    Order Specific Question:   Supervising Provider    Answer:   Milinda Pointer [phone]  .  pregabalin (LYRICA) 50 MG capsule    Sig: Take 1 capsule (50 mg total) by mouth 3 (three) times daily.    Dispense:  90 capsule    Refill:  2    Do not place this medication, or any other prescription from our practice, on "Automatic Refill". Patient may have prescription filled one day early if pharmacy is closed on scheduled refill date.    Order Specific Question:   Supervising Provider    Answer:   Milinda Pointer [[id number]]   New Prescriptions   No medications on file   Medications administered today: Morna A. Leu had no medications administered during this visit. Lab-work, procedure(s), and/or referral(s): Orders Placed This Encounter  Procedures  . ToxASSURE Select 13 (MW), Urine  . Comp. Metabolic Panel (12)  . Magnesium  . Vitamin B12  . Sedimentation rate  . 25-Hydroxyvitamin D Lcms D2+D3  . C-reactive protein   Imaging and/or referral(s): None  Planned, scheduled, and/or pending:  Not at this time.    Considering:  Left cervical epidural steroid injection #2 Diagnostic bilateral intra-articular knee injection Possible series of 5 bilateral Hyalgan intra-articular knee injections Diagnostic bilateral genicular nerve blocks  Possible bilateral genicular nerve radiofrequency ablation Diagnostic bilateral lumbar facet block  Possible bilateral lumbar facet radiofrequency ablation  Diagnostic bilateral intra-articular shoulder joint injection  Diagnostic bilateral suprascapular nerve block  Possible bilateral suprascapular nerve radiofrequency ablation  Diagnostic right-sided L5-S1 transforaminal epidural steroid injection Diagnostic left sided S1 transforaminal epidural steroid injection Diagnostic right-sided L4-5 lumbar epidural steroid injection    Palliative PRN treatment(s):  Palliative bilateral intra-articular knee injection with local anesthetic and steroid. Left cervical epidural  steroid injection #2    Provider-requested follow-up: Return  in about 3 months (around 09/23/2018) for MedMgmt.  Future Appointments  Date Time Provider [place]  09/24/2018 10:30 AM Vevelyn Francois, NP [facility] None   Primary Care Physician: Zeb Comfort, MD Location: [facility] Note by: Vevelyn Francois NP Date: 06/25/2018; Time: 3:29 PM  Pain Score Disclaimer: We use the NRS-11 scale. This is a self-reported, subjective measurement of pain severity with only modest accuracy. It is used primarily to identify changes within a particular patient. It must be understood that outpatient pain scales are significantly less accurate that those used for research, where they can be applied under ideal controlled circumstances with minimal exposure to variables. In reality, the score is likely to be a combination of pain intensity and pain affect, where pain affect describes the degree of emotional arousal or changes in action readiness caused by the sensory experience of pain. Factors such as social and work situation, setting, emotional state, anxiety levels, expectation, and prior pain experience may influence pain perception and show large inter-individual differences that may also be affected by time variables.  Patient instructions provided during this appointment: Patient Instructions  ____________________________________________________________________________________________  Medication Rules  Purpose: To inform patients, and their family members, of our rules and regulations.  Applies to: All patients receiving prescriptions (written or electronic).  Pharmacy of record: Pharmacy where electronic prescriptions will be sent. If written prescriptions are taken to a different pharmacy, please inform the nursing staff. The pharmacy listed in the electronic medical record should be the one where you would like electronic prescriptions to be sent.  Electronic prescriptions: In compliance with the [place] (STOP) Act of 2017 (Session Lanny Cramp [phone]), effective May 22, 2018, all controlled substances must be electronically prescribed. Calling prescriptions to the pharmacy will cease to exist.  Prescription refills: Only during scheduled appointments. Applies to all prescriptions.  NOTE: The following applies primarily to controlled substances (Opioid* Pain Medications).   Patient's responsibilities: 1. Pain Pills: Bring all pain pills to every appointment (except for procedure appointments). 2. Pill Bottles: Bring pills in original pharmacy bottle. Always bring the newest bottle. Bring bottle, even if empty. 3. Medication refills: You are responsible for knowing and keeping track of what medications you take and those you need refilled. The day before your appointment: write a list of all prescriptions that need to be refilled. The day of the appointment: give the list to the admitting nurse. Prescriptions will be written only during appointments. If you forget a medication: it will not be "Called in", "Faxed", or "electronically sent". You will need to get another appointment to get these prescribed. No early refills. Do not call asking to have your prescription filled early. 4. Prescription Accuracy: You are responsible for carefully inspecting your prescriptions before leaving our office. Have the discharge nurse carefully go over each prescription with you, before taking them home. Make sure that your name is accurately spelled, that your address is correct. Check the name and dose of your medication to make sure it is accurate. Check the number of pills, and the written instructions to make sure they are clear and accurate. Make sure that you are given enough medication to last until your next medication refill appointment. 5. Taking Medication: Take medication as prescribed. When it comes to controlled substances, taking less pills or less frequently than  prescribed is permitted and encouraged. Never take more pills than instructed.  Never take medication more frequently than prescribed.  6. Inform other Doctors: Always inform, all of your healthcare providers, of all the medications you take. 7. Pain Medication from other Providers: You are not allowed to accept any additional pain medication from any other Doctor or Healthcare provider. There are two exceptions to this rule. (see below) In the event that you require additional pain medication, you are responsible for notifying us, as stated below. 8. Medication Agreement: You are responsible for carefully reading and following our Medication Agreement. This must be signed before receiving any prescriptions from our practice. Safely store a copy of your signed Agreement. Violations to the Agreement will result in no further prescriptions. (Additional copies of our Medication Agreement are available upon request.) 9. Laws, Rules, & Regulations: All patients are expected to follow all Federal and [employer], Rules, [employer] [facility]. Ignorance of the Laws does not constitute a valid excuse. The use of any illegal substances is prohibited. 10. Adopted CDC guidelines & recommendations: Target dosing levels will be at or below 60 MME/day. Use of benzodiazepines** is not recommended.  Exceptions: There are only two exceptions to the rule of not receiving pain medications from other Healthcare Providers. 1. Exception #1 (Emergencies): In the event of an emergency (i.e.: accident requiring emergency care), you are allowed to receive additional pain medication. However, you are responsible for: As soon as you are able, call our office [phone], at any time of the day or night, and leave a message stating your name, the date and nature of the emergency, and the name and dose of the medication prescribed. In the event that your call is answered by a member of our staff, make sure to document and save the  date, time, and the name of the person that took your information.  2. Exception #2 (Planned Surgery): In the event that you are scheduled by another doctor or dentist to have any type of surgery or procedure, you are allowed (for a period no longer than 30 days), to receive additional pain medication, for the acute post-op pain. However, in this case, you are responsible for picking up a copy of our "Post-op Pain Management for Surgeons" handout, and giving it to your surgeon or dentist. This document is available at our office, and does not require an appointment to obtain it. Simply go to our office during business hours (Monday-Thursday from 8:00 AM to 4:00 PM) (Friday 8:00 AM to 12:00 Noon) or if you have a scheduled appointment with [place], prior to your surgery, and ask for it by name. In addition, you will need to provide [place] with your name, name of your surgeon, type of surgery, and date of procedure or surgery.  *Opioid medications include: morphine, codeine, oxycodone, oxymorphone, hydrocodone, hydromorphone, meperidine, tramadol, tapentadol, buprenorphine, fentanyl, methadone. **Benzodiazepine medications include: diazepam (Valium), alprazolam (Xanax), clonazepam (Klonopine), lorazepam (Ativan), clorazepate (Tranxene), chlordiazepoxide (Librium), estazolam (Prosom), oxazepam (Serax), temazepam (Restoril), triazolam (Halcion) (Last updated: 07/19/2017) ____________________________________________________________________________________________

## 2018-06-29 LAB — TOXASSURE SELECT 13 (MW), URINE

## 2018-06-30 LAB — SEDIMENTATION RATE: Sed Rate: 57 mm/hr — ABNORMAL HIGH (ref 0–40)

## 2018-06-30 LAB — COMP. METABOLIC PANEL (12)
ALBUMIN: 4.5 g/dL (ref 3.8–4.8)
AST: 19 IU/L (ref 0–40)
Albumin/Globulin Ratio: 1.6 (ref 1.2–2.2)
Alkaline Phosphatase: 132 IU/L — ABNORMAL HIGH (ref 39–117)
BUN/Creatinine Ratio: 11 — ABNORMAL LOW (ref 12–28)
BUN: 14 mg/dL (ref 8–27)
Bilirubin Total: 0.3 mg/dL (ref 0.0–1.2)
Calcium: 9.5 mg/dL (ref 8.7–10.3)
Chloride: 99 mmol/L (ref 96–106)
Creatinine, Ser: 1.27 mg/dL — ABNORMAL HIGH (ref 0.57–1.00)
GFR calc Af Amer: 49 mL/min/{1.73_m2} — ABNORMAL LOW (ref >59)
GFR calc non Af Amer: 43 mL/min/{1.73_m2} — ABNORMAL LOW (ref >59)
Globulin, Total: 2.9 g/dL (ref 1.5–4.5)
Glucose: 96 mg/dL (ref 65–99)
Potassium: 4.3 mmol/L (ref 3.5–5.2)
SODIUM: 139 mmol/L (ref 134–144)
TOTAL PROTEIN: 7.4 g/dL (ref 6.0–8.5)

## 2018-06-30 LAB — 25-HYDROXY VITAMIN D LCMS D2+D3
25-Hydroxy, Vitamin D-2: <1 ng/mL
25-Hydroxy, Vitamin D-3: 14 ng/mL
25-Hydroxy, Vitamin D: 15 ng/mL — ABNORMAL LOW

## 2018-06-30 LAB — MAGNESIUM: Magnesium: 1.8 mg/dL (ref 1.6–2.3)

## 2018-06-30 LAB — VITAMIN B12: Vitamin B-12: 349 pg/mL (ref 232–1245)

## 2018-06-30 LAB — C-REACTIVE PROTEIN: CRP: 15 mg/L — ABNORMAL HIGH (ref 0–10)

## 2018-07-01 ENCOUNTER — Other Ambulatory Visit: Payer: Self-pay | Admitting: Nurse Practitioner

## 2018-07-01 DIAGNOSIS — R700 Elevated erythrocyte sedimentation rate: Secondary | ICD-10-CM

## 2018-07-01 DIAGNOSIS — E559 Vitamin D deficiency, unspecified: Secondary | ICD-10-CM

## 2018-07-01 DIAGNOSIS — R7982 Elevated C-reactive protein (CRP): Secondary | ICD-10-CM

## 2018-07-01 MED ORDER — ERGOCALCIFEROL 1.25 MG (50000 UT) PO CAPS: 50000.0000 [IU] | ORAL_CAPSULE | ORAL | 0 refills | Status: DC

## 2018-07-03 LAB — ANA W/REFLEX IF POSITIVE
Anti JO-1: <0.2 AI (ref 0.0–0.9)
Anti Nuclear Antibody(ANA): POSITIVE — AB
Centromere Ab Screen: <0.2 AI (ref 0.0–0.9)
Chromatin Ab SerPl-aCnc: <0.2 AI (ref 0.0–0.9)
ENA RNP AB: 0.3 AI (ref 0.0–0.9)
ENA SSA (RO) Ab: <0.2 AI (ref 0.0–0.9)
Scleroderma SCL-70: 1.2 AI — ABNORMAL HIGH (ref 0.0–0.9)
dsDNA Ab: <1 IU/mL (ref 0–9)

## 2018-07-03 LAB — RHEUMATOID FACTOR: Rheumatoid fact SerPl-aCnc: 11.5 IU/mL (ref 0.0–13.9)

## 2018-07-03 LAB — SPECIMEN STATUS REPORT

## 2018-07-08 ENCOUNTER — Other Ambulatory Visit: Payer: Self-pay | Admitting: Nurse Practitioner

## 2018-07-08 ENCOUNTER — Encounter: Payer: Self-pay | Admitting: Nurse Practitioner

## 2018-07-08 DIAGNOSIS — R768 Other specified abnormal immunological findings in serum: Secondary | ICD-10-CM

## 2018-07-08 DIAGNOSIS — R700 Elevated erythrocyte sedimentation rate: Secondary | ICD-10-CM

## 2018-07-08 DIAGNOSIS — R7982 Elevated C-reactive protein (CRP): Secondary | ICD-10-CM

## 2018-09-24 ENCOUNTER — Other Ambulatory Visit: Payer: Self-pay

## 2018-09-24 ENCOUNTER — Ambulatory Visit: Payer: Medicare Other | Attending: Nurse Practitioner | Admitting: Nurse Practitioner

## 2018-09-24 DIAGNOSIS — M25562 Pain in left knee: Secondary | ICD-10-CM

## 2018-09-24 DIAGNOSIS — E559 Vitamin D deficiency, unspecified: Secondary | ICD-10-CM | POA: Diagnosis not present

## 2018-09-24 DIAGNOSIS — M47816 Spondylosis without myelopathy or radiculopathy, lumbar region: Secondary | ICD-10-CM | POA: Diagnosis not present

## 2018-09-24 DIAGNOSIS — G894 Chronic pain syndrome: Secondary | ICD-10-CM | POA: Diagnosis not present

## 2018-09-24 DIAGNOSIS — G8929 Other chronic pain: Secondary | ICD-10-CM

## 2018-09-24 DIAGNOSIS — M48061 Spinal stenosis, lumbar region without neurogenic claudication: Secondary | ICD-10-CM

## 2018-09-24 DIAGNOSIS — M25561 Pain in right knee: Secondary | ICD-10-CM

## 2018-09-24 MED ORDER — OXYCODONE HCL 5 MG PO TABS: 5.0000 mg | ORAL_TABLET | Freq: Four times a day (QID) | ORAL | 0 refills | Status: DC | PRN

## 2018-09-24 MED ORDER — PREGABALIN 50 MG PO CAPS: 50.0000 mg | ORAL_CAPSULE | Freq: Three times a day (TID) | ORAL | 2 refills | Status: AC

## 2018-09-24 MED ORDER — OXYCODONE HCL 5 MG PO TABS: 5.0000 mg | ORAL_TABLET | Freq: Four times a day (QID) | ORAL | 0 refills | Status: AC | PRN

## 2018-09-24 NOTE — Patient Instructions (Signed)
____________________________________________________________________________________________  Medication Rules  Purpose: To inform patients, and their family members, of our rules and regulations.  Applies to: All patients receiving prescriptions (written or electronic).  Pharmacy of record: Pharmacy where electronic prescriptions will be sent. If written prescriptions are taken to a different pharmacy, please inform the nursing staff. The pharmacy listed in the electronic medical record should be the one where you would like electronic prescriptions to be sent.  Electronic prescriptions: In compliance with the [place] [employer]rengthen Opioid Misuse Prevention (STOP) Act of 2017 (Session Law 2017-74/H243), effective May 22, 2018, all controlled substances must be electronically prescribed. Calling prescriptions to the pharmacy will cease to exist.  Prescription refills: Only during scheduled appointments. Applies to all prescriptions.  NOTE: The following applies primarily to controlled substances (Opioid* Pain Medications).   Patient's responsibilities: 1. Pain Pills: Bring all pain pills to every appointment (except for procedure appointments). 2. Pill Bottles: Bring pills in original pharmacy bottle. Always bring the newest bottle. Bring bottle, even if empty. 3. Medication refills: You are responsible for knowing and keeping track of what medications you take and those you need refilled. The day before your appointment: write a list of all prescriptions that need to be refilled. The day of the appointment: give the list to the admitting nurse. Prescriptions will be written only during appointments. No prescriptions will be written on procedure days. If you forget a medication: it will not be "Called in", "Faxed", or "electronically sent". You will need to get another appointment to get these prescribed. No early refills. Do not call asking to have your prescription filled  early. 4. Prescription Accuracy: You are responsible for carefully inspecting your prescriptions before leaving our office. Have the discharge nurse carefully go over each prescription with you, before taking them home. Make sure that your name is accurately spelled, that your address is correct. Check the name and dose of your medication to make sure it is accurate. Check the number of pills, and the written instructions to make sure they are clear and accurate. Make sure that you are given enough medication to last until your next medication refill appointment. 5. Taking Medication: Take medication as prescribed. When it comes to controlled substances, taking less pills or less frequently than prescribed is permitted and encouraged. Never take more pills than instructed. Never take medication more frequently than prescribed.  6. Inform other Doctors: Always inform, all of your healthcare providers, of all the medications you take. 7. Pain Medication from other Providers: You are not allowed to accept any additional pain medication from any other Doctor or Healthcare provider. There are two exceptions to this rule. (see below) In the event that you require additional pain medication, you are responsible for notifying us, as stated below. 8. Medication Agreement: You are responsible for carefully reading and following our Medication Agreement. This must be signed before receiving any prescriptions from our practice. Safely store a copy of your signed Agreement. Violations to the Agreement will result in no further prescriptions. (Additional copies of our Medication Agreement are available upon request.) 9. Laws, Rules, & Regulations: All patients are expected to follow all Federal and State Laws, Statutes, Rules, & Regulations. Ignorance of the Laws does not constitute a valid excuse. The use of any illegal substances is prohibited. 10. Adopted CDC guidelines & recommendations: Target dosing levels will be  at or below 60 MME/day. Use of benzodiazepines** is not recommended.  Exceptions: There are only two exceptions to the rule of not   receiving pain medications from other Healthcare Providers. 1. Exception #1 (Emergencies): In the event of an emergency (i.e.: accident requiring emergency care), you are allowed to receive additional pain medication. However, you are responsible for: As soon as you are able, call our office [phone], at any time of the day or night, and leave a message stating your name, the date and nature of the emergency, and the name and dose of the medication prescribed. In the event that your call is answered by a member of our staff, make sure to document and save the date, time, and the name of the person that took your information.  2. Exception #2 (Planned Surgery): In the event that you are scheduled by another doctor or dentist to have any type of surgery or procedure, you are allowed (for a period no longer than 30 days), to receive additional pain medication, for the acute post-op pain. However, in this case, you are responsible for picking up a copy of our "Post-op Pain Management for Surgeons" handout, and giving it to your surgeon or dentist. This document is available at our office, and does not require an appointment to obtain it. Simply go to our office during business hours (Monday-Thursday from 8:00 AM to 4:00 PM) (Friday 8:00 AM to 12:00 Noon) or if you have a scheduled appointment with us, prior to your surgery, and ask for it by name. In addition, you will need to provide us with your name, name of your surgeon, type of surgery, and date of procedure or surgery.  *Opioid medications include: morphine, codeine, oxycodone, oxymorphone, hydrocodone, hydromorphone, meperidine, tramadol, tapentadol, buprenorphine, fentanyl, methadone. **Benzodiazepine medications include: diazepam (Valium), alprazolam (Xanax), clonazepam (Klonopine), lorazepam (Ativan), clorazepate  (Tranxene), chlordiazepoxide (Librium), estazolam (Prosom), oxazepam (Serax), temazepam (Restoril), triazolam (Halcion) (Last updated: 07/19/2017) ____________________________________________________________________________________________    

## 2018-09-24 NOTE — Progress Notes (Signed)
Pain Management Encounter Note - Virtual Visit via Telephone Telehealth (real-time audio visits between healthcare provider and patient).  Patient's Phone No. & Preferred Pharmacy:  [phone] (home); [phone] (mobile); (Preferred) [phone]  [facility] [phone] [place], [place] - [id number] [place] [address] Phone: [phone] Fax: [phone]  [facility] [phone] [place], [place] - [address] [place] [address] Phone: [phone] Fax: [phone]  [facility] - [place], [place] - [address] [place] [address] Phone: [phone] Fax: [phone]   Pre-screening note:  Our staff contacted Ms. Robleto and offered her an "in person", "face-to-face" appointment versus a telephone encounter. She indicated preferring the telephone encounter, at this time.  Reason for Virtual Visit: COVID-19*  Social distancing based on CDC and AMA recommendations.   I contacted Lowanda Foster on 09/24/2018 at 10:45 AM by telephone and clearly identified myself as Thad Ranger, NP. I verified that I was speaking with the correct person using two identifiers (Name and date of birth: [birth year]).  Advanced Informed Consent I sought verbal advanced consent from Lowanda Foster for telemedicine interactions and virtual visit. I informed Ms. Lanigan of the security and privacy concerns, risks, and limitations associated with performing an evaluation and management service by telephone. I also informed Ms. Guttierrez of the availability of "in person" appointments and I informed her of the possibility of a patient responsible charge related to this service. Ms. Tulloch expressed understanding and agreed to proceed.   Historic Elements   Jennifer Rivers is a 73 y.o. year old, female patient evaluated today after her last encounter by our practice on 06/25/2018. Ms. Grade  has a past medical history of Asthma, GERD  (gastroesophageal reflux disease), Hypertension, Motion sickness, MRSA (methicillin resistant staph aureus) culture positive (08/2015), Multilevel degenerative disc disease, Osteoarthritis, Osteoporosis, and Throat infection. She also  has a past surgical history that includes Abdominal hysterectomy; Tonsillectomy; Knee arthroscopy (Right, 09/15/2015); Cataract extraction w/PHACO (Left, 12/10/2017); and Cataract extraction w/PHACO (Right, 01/07/2018). [name] has a current medication list which includes the following prescription(s): albuterol, atorvastatin, carvedilol, famotidine, fluticasone-salmeterol, furosemide, hydrochlorothiazide, olmesartan, omeprazole, ondansetron, oxycodone, oxycodone, oxycodone, pregabalin, and sertraline. She  reports that she quit smoking about 12 months ago. Her smoking use included cigarettes. She has a 2.50 pack-year smoking history. She has never used smokeless tobacco. She reports that she does not drink alcohol or use drugs. [name] is allergic to bupropion; codeine; gabapentin; tramadol; penicillins; and sulfa antibiotics.   HPI  I last saw her on 06/25/2018. She is being evaluated for medication management. She is having 6/10 right knee pain. She is not getting much relief. She denies any injuries or falls. She has some swelling. She denies any numbness or tingling. She had occasional weakness.   Pharmacotherapy Assessment  Analgesic:Oxycodone IR 5 mg 1 tablet by mouth every 6 hours (20 mg/dayof oxycodone) MME/day:30mg /day  Monitoring: Pharmacotherapy: No side-effects or adverse reactions reported. [place] PMP: PDMP reviewed during this encounter.       Compliance: No problems identified. Plan: Refer to "POC".  Review of recent tests  DG Chest 2 View CLINICAL DATA:  Asthma.  Wheezing and coughing.  EXAM: CHEST - 2 VIEW  COMPARISON:  April 16, 2017  FINDINGS: Scarring in the lateral right lung base. The heart, hila, mediastinum, lungs, and pleura  otherwise demonstrate no acute abnormality or change.  IMPRESSION: No active cardiopulmonary disease.  Electronically  Signed   By: Gerome Sam III M.D   On: 09/15/2017 13:26   Office Visit on 06/25/2018  Component Date Value Ref Range Status  . Summary 06/25/2018 FINAL   Final   Comment: ==================================================================== TOXASSURE SELECT 13 (MW) ==================================================================== Test                             Result       Flag       Units Drug Present and Declared for Prescription Verification   Oxycodone                      90           EXPECTED   ng/mg creat   Oxymorphone                    134          EXPECTED   ng/mg creat   Noroxycodone                   450          EXPECTED   ng/mg creat    Sources of oxycodone include scheduled prescription medications.    Oxymorphone and noroxycodone are expected metabolites of    oxycodone. Oxymorphone is also available as a scheduled    prescription medication. Drug Present not Declared for Prescription Verification   Alprazolam                     26           UNEXPECTED ng/mg creat   Alpha-hydroxyalprazolam        84           UNEXPECTED ng/mg creat    Source of alprazolam is a scheduled prescription medication.    Alpha-hydroxyalpraz                          olam is an expected metabolite of alprazolam. ==================================================================== Test                      Result    Flag   Units      Ref Range   Creatinine              135              mg/dL      >=28 ==================================================================== Declared Medications:  The flagging and interpretation on this report are based on the  following declared medications.  Unexpected results may arise from  inaccuracies in the declared medications.  **Note: The testing scope of this panel includes these medications:  Oxycodone  **Note: The testing  scope of this panel does not include following  reported medications:  Albuterol  Atorvastatin  Carvedilol  Famotidine  Fluticasone  Furosemide  Hydrochlorothiazide  Olmesartan (Benicar)  Omeprazole (Prilosec)  Ondansetron  Pregabalin  Salmeterol  Sertraline ==================================================================== For clinical consultation, please call                           [phone]. ====================================================================   . Glucose 06/25/2018 96  65 - 99 mg/dL Final  . BUN 65/07/5463 14  8 - 27 mg/dL Final  . Creatinine, Ser 06/25/2018 1.27* 0.57 - 1.00 mg/dL Final  . GFR calc non Af Amer 06/25/2018 43* >  59 mL/min/1.73 Final  . GFR calc Af Amer 06/25/2018 49* >59 mL/min/1.73 Final  . BUN/Creatinine Ratio 06/25/2018 11* 12 - 28 Final  . Sodium 06/25/2018 139  134 - 144 mmol/L Final  . Potassium 06/25/2018 4.3  3.5 - 5.2 mmol/L Final  . Chloride 06/25/2018 99  96 - 106 mmol/L Final  . Calcium 06/25/2018 9.5  8.7 - 10.3 mg/dL Final  . Total Protein 06/25/2018 7.4  6.0 - 8.5 g/dL Final  . Albumin 40/98/1191 4.5  3.8 - 4.8 g/dL Final                 **Please note reference interval change**  . Globulin, Total 06/25/2018 2.9  1.5 - 4.5 g/dL Final  . Albumin/Globulin Ratio 06/25/2018 1.6  1.2 - 2.2 Final  . Bilirubin Total 06/25/2018 0.3  0.0 - 1.2 mg/dL Final  . Alkaline Phosphatase 06/25/2018 132* 39 - 117 IU/L Final  . AST 06/25/2018 19  0 - 40 IU/L Final  . Magnesium 06/25/2018 1.8  1.6 - 2.3 mg/dL Final  . Vitamin Y-78 29/56/2130 349  232 - 1,245 pg/mL Final  . Sed Rate 06/25/2018 57* 0 - 40 mm/hr Final  . 25-Hydroxy, Vitamin D 06/25/2018 15* ng/mL Final   Comment: Reference Range: All Ages: Target levels 30 - 100   . 25-Hydroxy, Vitamin D-2 06/25/2018 <1.0  ng/mL Final  . 25-Hydroxy, Vitamin D-3 06/25/2018 14  ng/mL Final  . CRP 06/25/2018 15* 0 - 10 mg/L Final  . Rhuematoid fact SerPl-aCnc 06/25/2018 11.5  0.0 -  13.9 IU/mL Final  . Anti Nuclear Antibody(ANA) 06/25/2018 Positive* Negative Final  . dsDNA Ab 06/25/2018 <1  0 - 9 IU/mL Final   Comment:                                    Negative      <5                                    Equivocal  5 - 9                                    Positive      >9   . ENA RNP Ab 06/25/2018 0.3  0.0 - 0.9 AI Final  . ENA SM Ab Ser-aCnc 06/25/2018 <0.2  0.0 - 0.9 AI Final  . Scleroderma SCL-70 06/25/2018 1.2* 0.0 - 0.9 AI Final  . ENA SSA (RO) Ab 06/25/2018 <0.2  0.0 - 0.9 AI Final  . ENA SSB (LA) Ab 06/25/2018 <0.2  0.0 - 0.9 AI Final  . Chromatin Ab SerPl-aCnc 06/25/2018 <0.2  0.0 - 0.9 AI Final  . Anti JO-1 06/25/2018 <0.2  0.0 - 0.9 AI Final  . Centromere Ab Screen 06/25/2018 <0.2  0.0 - 0.9 AI Final  . See below: 06/25/2018 Comment   Final   Comment: Autoantibody                       Disease Association ------------------------------------------------------------                         Condition  Frequency ---------------------   ------------------------   --------- Antinuclear Antibody,    SLE, mixed connective Direct (ANA-D)           tissue diseases ---------------------   ------------------------   --------- dsDNA                    SLE                        40 - 60% ---------------------   ------------------------   --------- Chromatin                Drug induced SLE                90%                          SLE                        48 - 97% ---------------------   ------------------------   --------- SSA (Ro)                 SLE                        25 - 35%                          Sjogren's Syndrome         40 - 70%                          Neonatal Lupus                 100% ---------------------   ------------------------   --------- SSB (La)                 SLE                                                       10%                          Sjogren's Syndrome              30% ---------------------    -----------------------    --------- Sm (anti-Smith)          SLE                        15 - 30% ---------------------   -----------------------    --------- RNP                      Mixed Connective Tissue                          Disease                         95% (U1 nRNP,                SLE  30 - 50% anti-ribonucleoprotein)  Polymyositis and/or                          Dermatomyositis                 20% ---------------------   ------------------------   --------- Scl-70 (antiDNA          Scleroderma (diffuse)      20 - 35% topoisomerase)           Crest                           13% ---------------------   ------------------------   --------- Jo-1                     Polymyositis and/or                          Dermatomyositis            20 - 40% ---------------------   ------------------------   --------- Centromere B             Scleroderma -                           Crest                          variant                         80%   . specimen status report 06/25/2018 Comment   Final   Comment: Written Authorization Written Authorization Written Authorization Received. 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  Plan of Care  I have discontinued Vinette A. Hlavaty's ergocalciferol. I am also having her maintain her albuterol, omeprazole, furosemide, atorvastatin, hydrochlorothiazide, carvedilol, Fluticasone-Salmeterol (ADVAIR DISKUS IN), sertraline, olmesartan, ondansetron, famotidine, oxyCODONE, oxyCODONE, oxyCODONE, and pregabalin.  Pharmacotherapy (Medications Ordered): Meds ordered this encounter  Medications  . oxyCODONE (OXY  IR/ROXICODONE) 5 MG immediate release tablet    Sig: Take 1 tablet (5 mg total) by mouth every 6 (six) hours as needed for up to 30 days for severe pain.    Dispense:  120 tablet    Refill:  0    Do not place this medication on "Automatic Refill". Patient may have prescription filled one day early if pharmacy is closed on scheduled refill date.    Order Specific Question:   Supervising Provider    Answer:   Delano Metz [phone]  . oxyCODONE (OXY IR/ROXICODONE) 5 MG immediate release tablet    Sig: Take 1 tablet (5 mg total) by mouth every 6 (six) hours as needed for up to 30 days for severe pain.    Dispense:  120 tablet    Refill:  0    Do not place this medication on "Automatic Refill". Patient may have prescription filled one day early if pharmacy is closed on scheduled refill date.    Order Specific Question:   Supervising Provider    Answer:   Delano Metz [  [id number]]  . oxyCODONE (OXY IR/ROXICODONE) 5 MG immediate release tablet    Sig: Take 1 tablet (5 mg total) by mouth every 6 (six) hours as needed for up to 30 days for severe pain.    Dispense:  120 tablet    Refill:  0    Do not place this medication on "Automatic Refill". Patient may have prescription filled one day early if pharmacy is closed on scheduled refill date.    Order Specific Question:   Supervising Provider    Answer:   Delano MetzNAVEIRA, FRANCISCO [phone][[id number]]  . pregabalin (LYRICA) 50 MG capsule    Sig: Take 1 capsule (50 mg total) by mouth 3 (three) times daily.    Dispense:  90 capsule    Refill:  2    Do not place this medication, or any other prescription from our practice, on "Automatic Refill". Patient may have prescription filled one day early if pharmacy is closed on scheduled refill date.    Order Specific Question:   Supervising Provider    Answer:   Delano MetzNAVEIRA, FRANCISCO [phone][[id number]]   Orders:  No orders of the defined types were placed in this encounter.  Follow-up plan:   Return in about 3 months (around  12/25/2018) for MedMgmt.   I discussed the assessment and treatment plan with the patient. The patient was provided an opportunity to ask questions and all were answered. The patient agreed with the plan and demonstrated an understanding of the instructions.  Patient advised to call back or seek an in-person evaluation if the symptoms or condition worsens.  Total duration of non-face-to-face encounter: 13 minutes.  Note by: Thad Rangerrystal Everett Ehrler, NP Date: 09/24/2018; Time: 10:45 AM  Disclaimer:  * Given the special circumstances of the COVID-19 pandemic, the federal government has announced that the Office for Civil Rights (OCR) will exercise its enforcement discretion and will not impose penalties on physicians using telehealth in the event of noncompliance with regulatory requirements under the [employer] Insurance Portability and Accountability Act (HIPAA) in connection with the good faith provision of telehealth during the COVID-19 national public health emergency. (AMA)

## 2018-12-19 ENCOUNTER — Encounter: Payer: Self-pay | Admitting: Pain Medicine

## 2018-12-21 DEATH — deceased

## 2018-12-22 NOTE — Progress Notes (Signed)
Pain Management Virtual Encounter Attempt via Telephone   Patient's Phone No. & Preferred Pharmacy:  [phone] (home); [phone] (mobile); (Preferred) [phone] [email or web address]   I attempted to contact Dwyane Dee on 12/23/2018 via telephone. I was unable to complete the visit due to no one answering the phone, or unable to get a proper contact number. I was also unable to leave a message.  I attempted to contact this patient several times through the day but the only phone number that she left this goes directly into voicemail, which in turn says that he has not been set up yet and therefore there is no way for [place] to leave any messages.  Apparently the patient was not in a hurry to contact us today since she has enough medications to last until 01/01/2019.  Unfortunately, the rest of my week I am booked solid and the week after that I will be out on vacation.  Therefore, by not providing [place] with a reliable means of contacting her, she is running the risk of running out of pain medication.  Analgesic: Oxycodone IR 5 mg, 1 tab PO q 6 hrs (20 mg/dayof oxycodone) MME/day:30mg /day.     Interventional therapies:  Considering:   Possible series of 5 bilateral Hyalgan intra-articular knee injections  Diagnostic bilateral genicular NB  Possible bilateral genicular nerve RFA  Diagnostic bilateral lumbar facet block  Possible bilateral lumbar facet RFA  Diagnostic bilateral intra-articular shoulder joint injection  Diagnostic bilateral suprascapular NB  Possible bilateral suprascapular nerve RFA  Diagnostic left sided S1 TFESI  Diagnostic right-sided L4-5 LESI    Palliative PRN treatment(s):   Palliative bilateral intra-articular knee joint injection (w/ steroids) #3  Diagnostic/therapeutic left-sided CESI #2  Diagnostic/therapeutic right-sided L3-4 LESI #2  Diagnostic/therapeutic right-sided L5 TFESI #2      Note by: Gaspar Cola, MD Date: 12/23/2018; Time: 5:28 PM

## 2018-12-23 ENCOUNTER — Other Ambulatory Visit: Payer: Self-pay

## 2018-12-23 ENCOUNTER — Ambulatory Visit: Payer: Medicare HMO | Attending: Pain Medicine | Admitting: Pain Medicine

## 2018-12-23 DIAGNOSIS — M545 Low back pain: Secondary | ICD-10-CM

## 2018-12-23 DIAGNOSIS — M25511 Pain in right shoulder: Secondary | ICD-10-CM

## 2018-12-23 DIAGNOSIS — G8929 Other chronic pain: Secondary | ICD-10-CM

## 2018-12-23 DIAGNOSIS — M25562 Pain in left knee: Secondary | ICD-10-CM

## 2018-12-23 DIAGNOSIS — M25512 Pain in left shoulder: Secondary | ICD-10-CM

## 2018-12-23 DIAGNOSIS — M25561 Pain in right knee: Secondary | ICD-10-CM

## 2018-12-23 DIAGNOSIS — G894 Chronic pain syndrome: Secondary | ICD-10-CM

## 2018-12-23 DIAGNOSIS — M792 Neuralgia and neuritis, unspecified: Secondary | ICD-10-CM

## 2019-03-26 ENCOUNTER — Ambulatory Visit: Payer: Medicare HMO | Admitting: Pain Medicine

## 2019-11-20 IMAGING — CR DG CHEST 2V
2 series · 2 of 2 positions shown · non-contrast
Comparison: April 16, 2017

CLINICAL DATA: Asthma.  Wheezing and coughing.

EXAM:
CHEST - 2 VIEW

[chest pa]
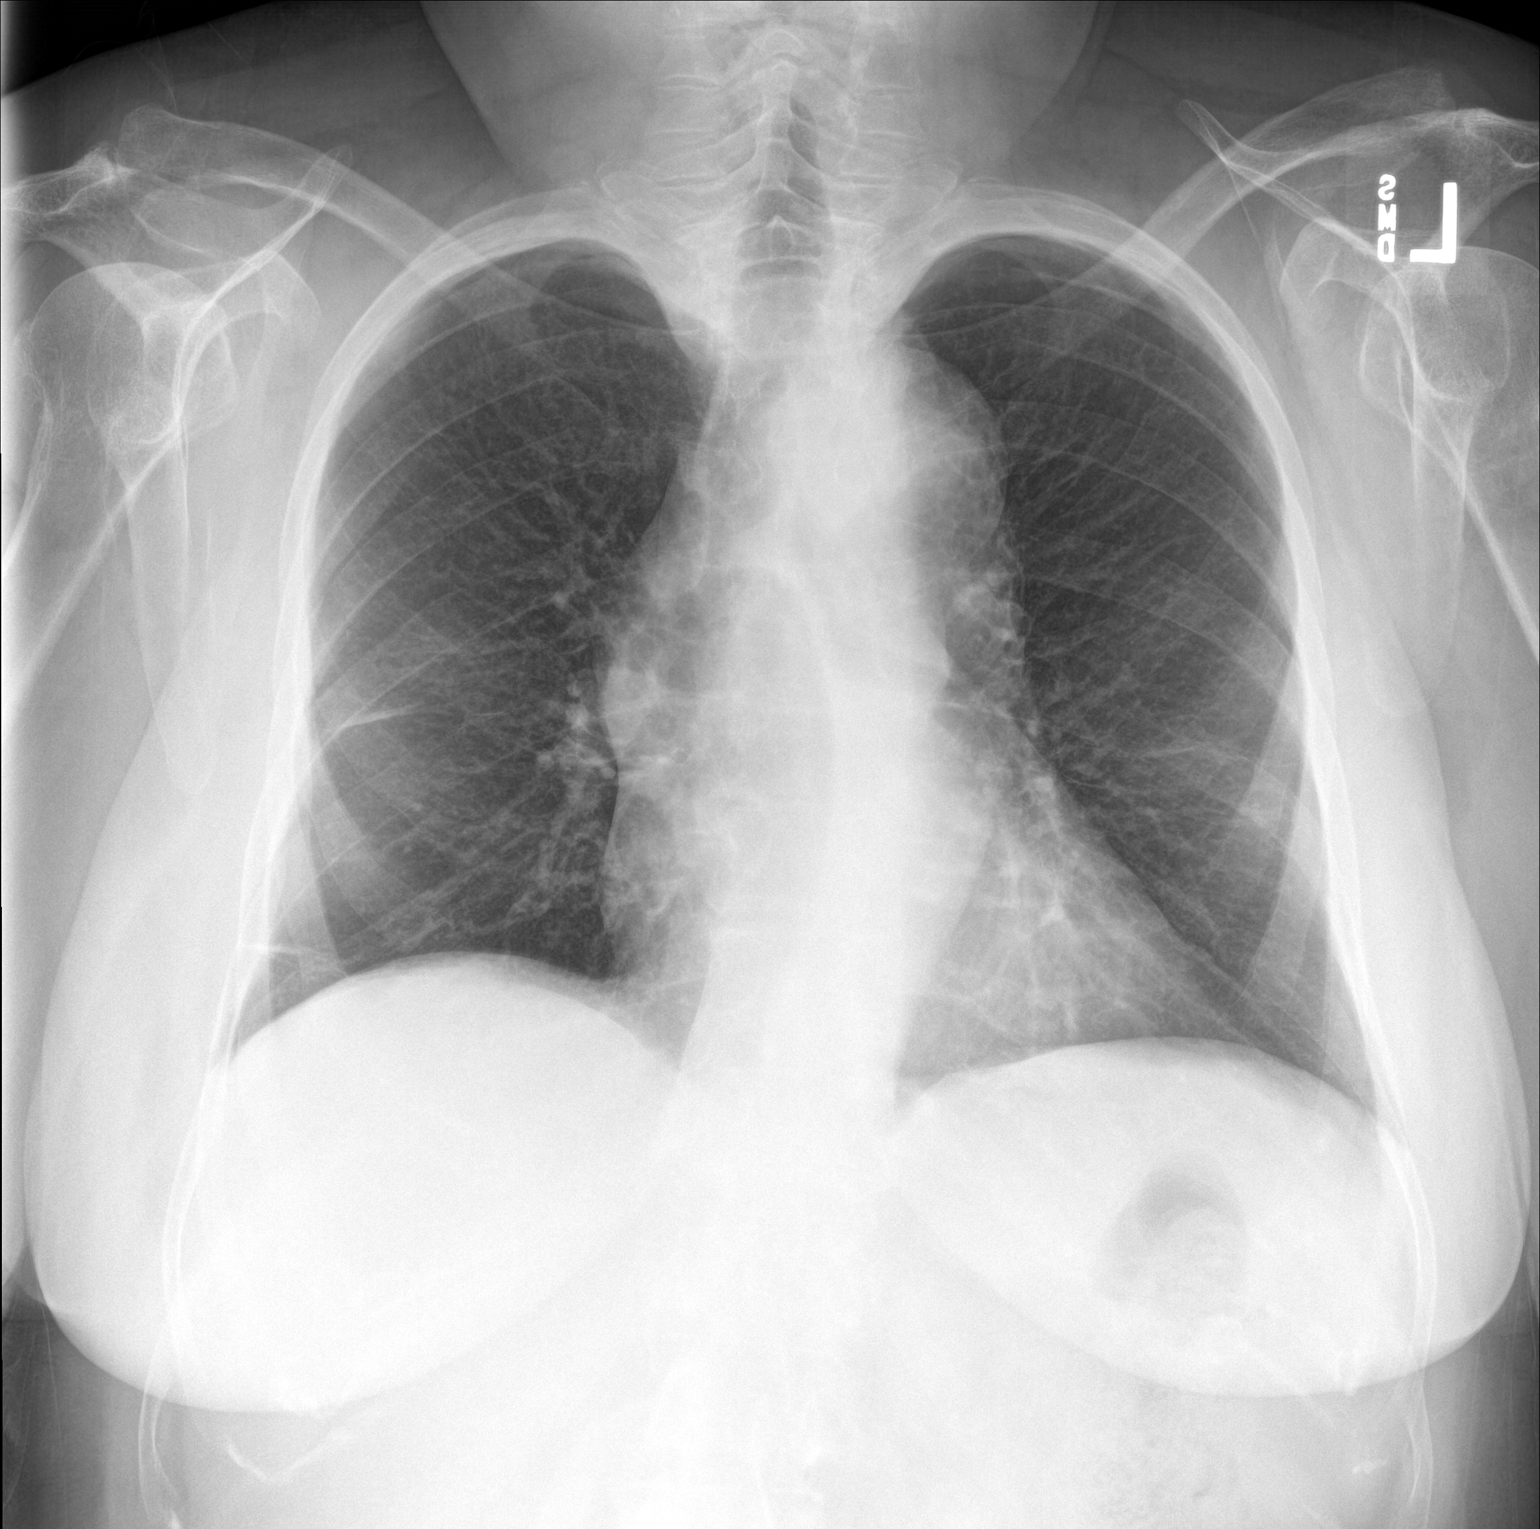

[chest lat]
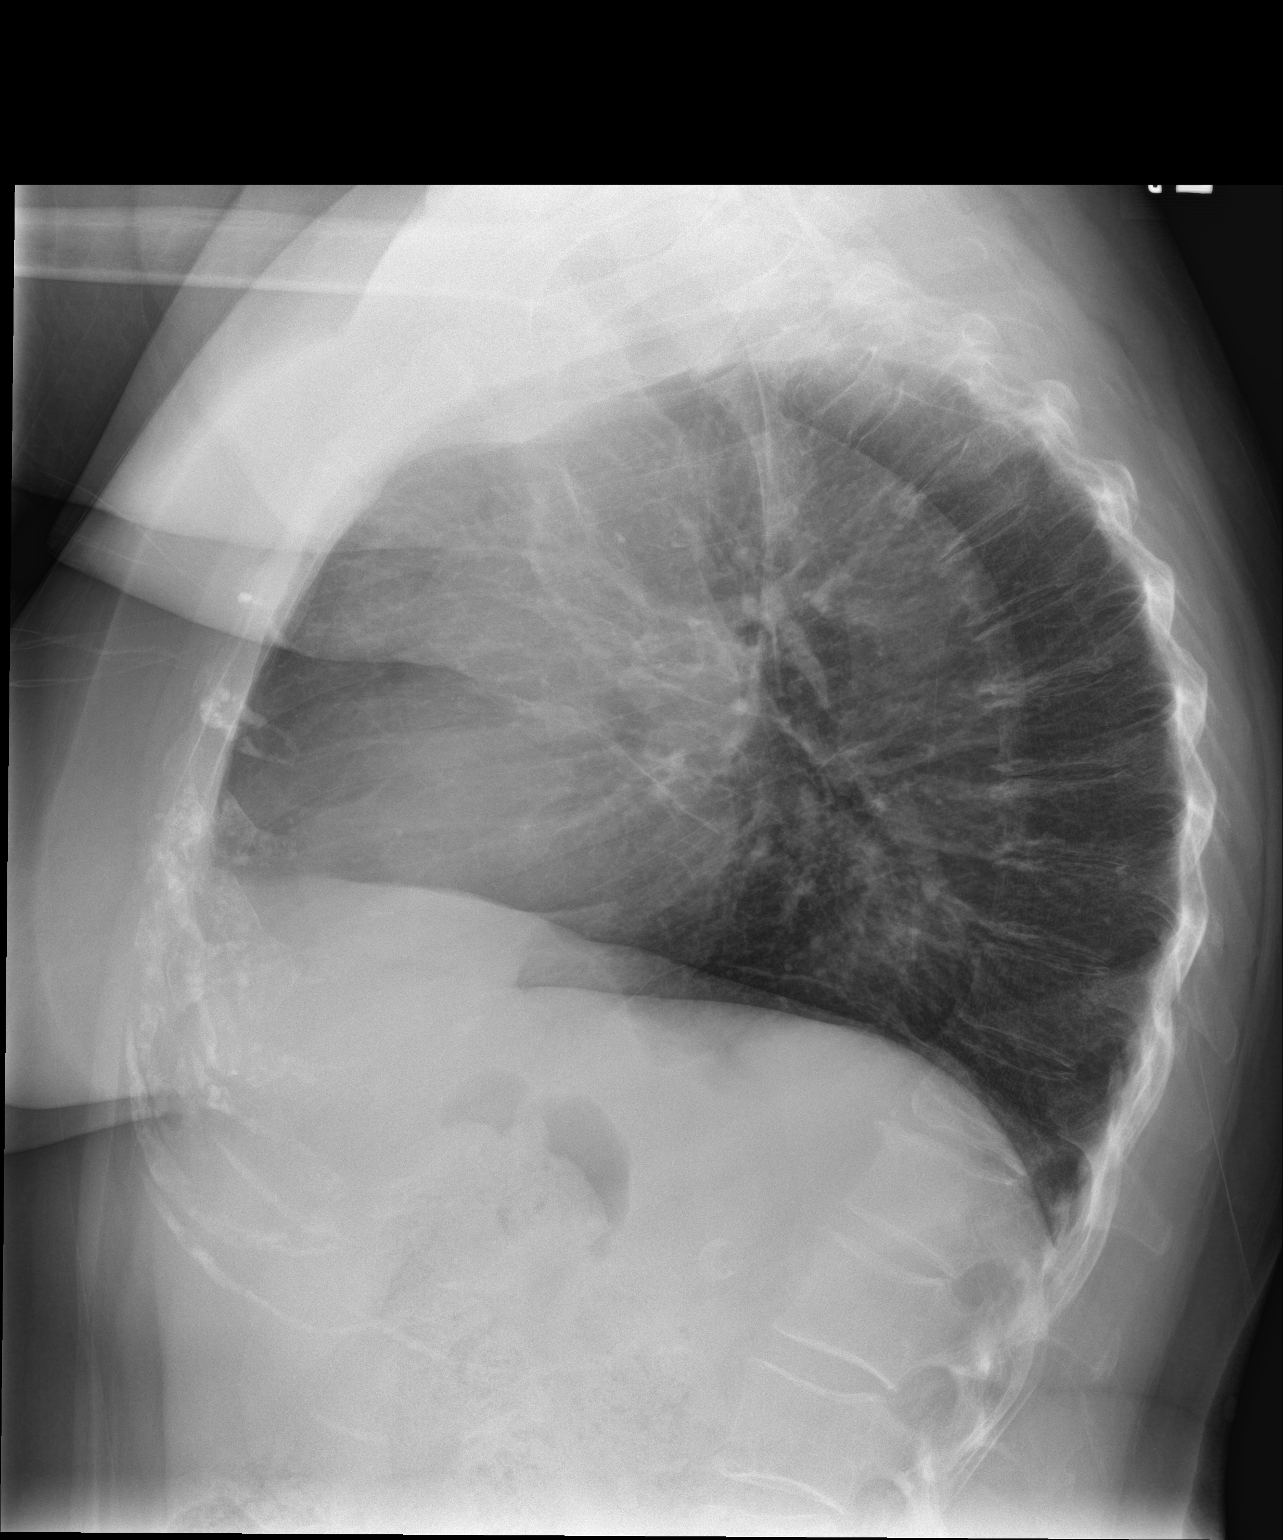

[2 of 2 positions shown; findings below may reference images not displayed]

FINDINGS: Scarring in the lateral right lung base. The heart, hila,
mediastinum, lungs, and pleura otherwise demonstrate no acute
abnormality or change.
IMPRESSION: No active cardiopulmonary disease.
